# Patient Record
Sex: Female | Born: 1980
Health system: Southern US, Community
[De-identification: ages and names within clinical notes are randomized; demographics above are authoritative.]

## PROBLEM LIST (undated history)

## (undated) DIAGNOSIS — E039 Hypothyroidism, unspecified: Secondary | ICD-10-CM

## (undated) DIAGNOSIS — K219 Gastro-esophageal reflux disease without esophagitis: Secondary | ICD-10-CM

## (undated) DIAGNOSIS — M549 Dorsalgia, unspecified: Secondary | ICD-10-CM

## (undated) DIAGNOSIS — C73 Malignant neoplasm of thyroid gland: Secondary | ICD-10-CM

## (undated) DIAGNOSIS — M2550 Pain in unspecified joint: Secondary | ICD-10-CM

## (undated) DIAGNOSIS — I10 Essential (primary) hypertension: Secondary | ICD-10-CM

## (undated) DIAGNOSIS — E559 Vitamin D deficiency, unspecified: Secondary | ICD-10-CM

## (undated) DIAGNOSIS — Z86718 Personal history of other venous thrombosis and embolism: Secondary | ICD-10-CM

## (undated) DIAGNOSIS — Z9889 Other specified postprocedural states: Secondary | ICD-10-CM

## (undated) DIAGNOSIS — E7800 Pure hypercholesterolemia, unspecified: Secondary | ICD-10-CM

## (undated) DIAGNOSIS — I2699 Other pulmonary embolism without acute cor pulmonale: Secondary | ICD-10-CM

## (undated) DIAGNOSIS — D689 Coagulation defect, unspecified: Secondary | ICD-10-CM

## (undated) DIAGNOSIS — R112 Nausea with vomiting, unspecified: Secondary | ICD-10-CM

## (undated) HISTORY — DX: Vitamin D deficiency, unspecified: E55.9

## (undated) HISTORY — DX: Pain in unspecified joint: M25.50

## (undated) HISTORY — DX: Essential (primary) hypertension: I10

## (undated) HISTORY — DX: Coagulation defect, unspecified: D68.9

## (undated) HISTORY — DX: Hypothyroidism, unspecified: E03.9

## (undated) HISTORY — DX: Dorsalgia, unspecified: M54.9

## (undated) HISTORY — DX: Gastro-esophageal reflux disease without esophagitis: K21.9

## (undated) HISTORY — DX: Personal history of other venous thrombosis and embolism: Z86.718

## (undated) HISTORY — DX: Pure hypercholesterolemia, unspecified: E78.00

## (undated) HISTORY — PX: OTHER SURGICAL HISTORY: SHX169

## (undated) HISTORY — PX: KNEE SURGERY: SHX244

## (undated) HISTORY — DX: Other pulmonary embolism without acute cor pulmonale: I26.99

---

## 2015-12-29 ENCOUNTER — Ambulatory Visit: Payer: Self-pay | Admitting: Podiatry

## 2017-10-16 DIAGNOSIS — I2699 Other pulmonary embolism without acute cor pulmonale: Secondary | ICD-10-CM

## 2017-10-16 HISTORY — DX: Other pulmonary embolism without acute cor pulmonale: I26.99

## 2017-11-07 ENCOUNTER — Encounter: Payer: Self-pay | Admitting: Internal Medicine

## 2017-11-07 ENCOUNTER — Ambulatory Visit (INDEPENDENT_AMBULATORY_CARE_PROVIDER_SITE_OTHER): Payer: 59 | Admitting: Internal Medicine

## 2017-11-07 VITALS — BP 128/80 | HR 105 | Temp 99.9°F | Ht 68.0 in

## 2017-11-07 DIAGNOSIS — R059 Cough, unspecified: Secondary | ICD-10-CM

## 2017-11-07 DIAGNOSIS — R05 Cough: Secondary | ICD-10-CM

## 2017-11-07 DIAGNOSIS — J069 Acute upper respiratory infection, unspecified: Secondary | ICD-10-CM | POA: Diagnosis not present

## 2017-11-07 DIAGNOSIS — J042 Acute laryngotracheitis: Secondary | ICD-10-CM

## 2017-11-07 MED ORDER — HYDROCODONE-HOMATROPINE 5-1.5 MG/5ML PO SYRP: 5.0000 mL | ORAL_SOLUTION | Freq: Three times a day (TID) | ORAL | 0 refills | Status: DC | PRN

## 2017-11-07 MED ORDER — METHYLPREDNISOLONE ACETATE 80 MG/ML IJ SUSP
80.0000 mg | Freq: Once | INTRAMUSCULAR | Status: AC
  Administered 2017-11-07: 80 mg via INTRAMUSCULAR

## 2017-11-07 MED ORDER — LEVOFLOXACIN 500 MG PO TABS
500.0000 mg | ORAL_TABLET | Freq: Every day | ORAL | 0 refills | Status: DC
Start: 2017-11-07 — End: 2017-11-20

## 2017-11-07 NOTE — Progress Notes (Addendum)
   Subjective:    Patient ID: Penny Hancock, female    DOB: 1981/03/03, 37 y.o.   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  Her general health is excellent.  She has no known drug allergies.  Nearly a week ago she came down with a respiratory infection that she has not been able to get rid.  She has had lots of coughing and some stridor and some sore throat.  She has a history of GE reflux for which she takes Nexium.  NKDA Hx fractured ankle and wrist Hx knee surgery  SHx: Single, nonsmoker  FHx: Parents and twin brothers in good health.  Review of Systems patient says at times she has felt that she had stridor     Objective:   Physical Exam Skin warm and dry.  Pharynx very slightly injected.  TMs are clear.  Neck is supple without adenopathy.  Chest is here to auscultation without rales or wheezing.  No stridor observed.       Assessment & Plan:  Acute respiratory infection with possible laryngeal tracheitis  Plan: Depo-Medrol 80 mg IM.  Hycodan 1 teaspoon p.o. every 8 hours as needed cough.  Continue Nexium.  Levaquin 500 mg daily for 10 days.  Call if not better in 7 to 10 days or sooner if worse.  Rest and drink plenty of fluids.

## 2017-11-10 NOTE — Patient Instructions (Signed)
It was a pleasure to see you today!   

## 2017-11-18 ENCOUNTER — Other Ambulatory Visit: Payer: Self-pay | Admitting: Internal Medicine

## 2017-11-18 ENCOUNTER — Other Ambulatory Visit: Payer: Self-pay

## 2017-11-18 ENCOUNTER — Ambulatory Visit
Admission: RE | Admit: 2017-11-18 | Discharge: 2017-11-18 | Disposition: A | Discharge status: Home / Self Care | Payer: 59 | Source: Ambulatory Visit | Attending: Internal Medicine | Admitting: Internal Medicine

## 2017-11-18 ENCOUNTER — Ambulatory Visit (HOSPITAL_COMMUNITY)
Admission: RE | Admit: 2017-11-18 | Discharge: 2017-11-18 | Disposition: A | Discharge status: Home / Self Care | Payer: 59 | Source: Ambulatory Visit | Attending: Internal Medicine | Admitting: Internal Medicine

## 2017-11-18 ENCOUNTER — Ambulatory Visit (INDEPENDENT_AMBULATORY_CARE_PROVIDER_SITE_OTHER): Payer: 59 | Admitting: Internal Medicine

## 2017-11-18 ENCOUNTER — Encounter: Payer: Self-pay | Admitting: Internal Medicine

## 2017-11-18 ENCOUNTER — Encounter (HOSPITAL_COMMUNITY): Payer: Self-pay | Admitting: *Deleted

## 2017-11-18 ENCOUNTER — Inpatient Hospital Stay (HOSPITAL_COMMUNITY)
Admission: EM | Admit: 2017-11-18 | Discharge: 2017-11-20 | DRG: 176 | Disposition: A | Discharge status: Home / Self Care | Payer: 59 | Source: Ambulatory Visit | Attending: Family Medicine | Admitting: Family Medicine

## 2017-11-18 ENCOUNTER — Encounter (HOSPITAL_COMMUNITY): Payer: Self-pay | Admitting: Emergency Medicine

## 2017-11-18 VITALS — BP 130/90 | HR 114 | Temp 99.0°F | Ht 68.0 in | Wt 250.0 lb

## 2017-11-18 DIAGNOSIS — Z79899 Other long term (current) drug therapy: Secondary | ICD-10-CM | POA: Diagnosis not present

## 2017-11-18 DIAGNOSIS — K219 Gastro-esophageal reflux disease without esophagitis: Secondary | ICD-10-CM | POA: Diagnosis present

## 2017-11-18 DIAGNOSIS — R05 Cough: Secondary | ICD-10-CM

## 2017-11-18 DIAGNOSIS — Z8639 Personal history of other endocrine, nutritional and metabolic disease: Secondary | ICD-10-CM | POA: Diagnosis not present

## 2017-11-18 DIAGNOSIS — I2699 Other pulmonary embolism without acute cor pulmonale: Principal | ICD-10-CM | POA: Diagnosis present

## 2017-11-18 DIAGNOSIS — R0602 Shortness of breath: Secondary | ICD-10-CM | POA: Diagnosis not present

## 2017-11-18 DIAGNOSIS — Z6841 Body Mass Index (BMI) 40.0 and over, adult: Secondary | ICD-10-CM

## 2017-11-18 DIAGNOSIS — R059 Cough, unspecified: Secondary | ICD-10-CM

## 2017-11-18 DIAGNOSIS — E663 Overweight: Secondary | ICD-10-CM | POA: Diagnosis present

## 2017-11-18 DIAGNOSIS — Z832 Family history of diseases of the blood and blood-forming organs and certain disorders involving the immune mechanism: Secondary | ICD-10-CM

## 2017-11-18 DIAGNOSIS — R000 Tachycardia, unspecified: Secondary | ICD-10-CM | POA: Diagnosis present

## 2017-11-18 DIAGNOSIS — R0902 Hypoxemia: Secondary | ICD-10-CM | POA: Diagnosis present

## 2017-11-18 DIAGNOSIS — R748 Abnormal levels of other serum enzymes: Secondary | ICD-10-CM | POA: Diagnosis present

## 2017-11-18 LAB — CBC WITH DIFFERENTIAL/PLATELET
BASOS ABS: 0 10*3/uL (ref 0.0–0.1)
BASOS PCT: 0 %
EOS PCT: 0 %
Eosinophils Absolute: 0 10*3/uL (ref 0.0–0.7)
HEMATOCRIT: 43.5 % (ref 36.0–46.0)
Hemoglobin: 14.4 g/dL (ref 12.0–15.0)
LYMPHS PCT: 18 %
Lymphs Abs: 1.5 10*3/uL (ref 0.7–4.0)
MCH: 30.7 pg (ref 26.0–34.0)
MCHC: 33.1 g/dL (ref 30.0–36.0)
MCV: 92.8 fL (ref 78.0–100.0)
Monocytes Absolute: 0.6 10*3/uL (ref 0.1–1.0)
Monocytes Relative: 7 %
NEUTROS ABS: 6.2 10*3/uL (ref 1.7–7.7)
Neutrophils Relative %: 75 %
PLATELETS: 235 10*3/uL (ref 150–400)
RBC: 4.69 MIL/uL (ref 3.87–5.11)
RDW: 13.4 % (ref 11.5–15.5)
WBC: 8.4 10*3/uL (ref 4.0–10.5)

## 2017-11-18 LAB — PROTIME-INR
INR: 1.02
Prothrombin Time: 13.3 seconds (ref 11.4–15.2)

## 2017-11-18 LAB — COMPREHENSIVE METABOLIC PANEL
ALBUMIN: 3.8 g/dL (ref 3.5–5.0)
ALT: 20 U/L (ref 14–54)
ANION GAP: 11 (ref 5–15)
AST: 20 U/L (ref 15–41)
Alkaline Phosphatase: 76 U/L (ref 38–126)
BILIRUBIN TOTAL: 0.7 mg/dL (ref 0.3–1.2)
BUN: 13 mg/dL (ref 6–20)
CO2: 22 mmol/L (ref 22–32)
Calcium: 9.1 mg/dL (ref 8.9–10.3)
Chloride: 106 mmol/L (ref 101–111)
Creatinine, Ser: 1.07 mg/dL — ABNORMAL HIGH (ref 0.44–1.00)
GFR calc Af Amer: >60 mL/min (ref >60)
GFR calc non Af Amer: >60 mL/min (ref >60)
Glucose, Bld: 111 mg/dL — ABNORMAL HIGH (ref 65–99)
POTASSIUM: 4.1 mmol/L (ref 3.5–5.1)
Sodium: 139 mmol/L (ref 135–145)
TOTAL PROTEIN: 7.4 g/dL (ref 6.5–8.1)

## 2017-11-18 LAB — APTT: aPTT: 25 seconds (ref 24–36)

## 2017-11-18 LAB — ANTITHROMBIN III: AntiThromb III Func: 96 % (ref 75–120)

## 2017-11-18 LAB — PHOSPHORUS: Phosphorus: 3.4 mg/dL (ref 2.5–4.6)

## 2017-11-18 LAB — MAGNESIUM: Magnesium: 2 mg/dL (ref 1.7–2.4)

## 2017-11-18 LAB — TROPONIN I: TROPONIN I: 0.14 ng/mL — AB (ref <0.03)

## 2017-11-18 MED ORDER — PANTOPRAZOLE SODIUM 40 MG PO TBEC: 40.0000 mg | DELAYED_RELEASE_TABLET | Freq: Every day | ORAL | Status: DC | PRN

## 2017-11-18 MED ORDER — IOPAMIDOL (ISOVUE-370) INJECTION 76%
INTRAVENOUS | Status: AC
  Filled 2017-11-18: qty 100

## 2017-11-18 MED ORDER — ONDANSETRON HCL 4 MG PO TABS
4.0000 mg | ORAL_TABLET | Freq: Four times a day (QID) | ORAL | Status: DC | PRN
Start: 2017-11-18 — End: 2017-11-20

## 2017-11-18 MED ORDER — ACETAMINOPHEN 650 MG RE SUPP: 650.0000 mg | Freq: Four times a day (QID) | RECTAL | Status: DC | PRN

## 2017-11-18 MED ORDER — IOPAMIDOL (ISOVUE-370) INJECTION 76%
100.0000 mL | Freq: Once | INTRAVENOUS | Status: AC | PRN
  Administered 2017-11-18: 100 mL via INTRAVENOUS

## 2017-11-18 MED ORDER — ENOXAPARIN SODIUM 120 MG/0.8ML ~~LOC~~ SOLN
120.0000 mg | Freq: Two times a day (BID) | SUBCUTANEOUS | Status: DC
  Administered 2017-11-18 – 2017-11-19 (×2): 120 mg via SUBCUTANEOUS
  Filled 2017-11-18 (×3): qty 0.8

## 2017-11-18 MED ORDER — ACETAMINOPHEN 325 MG PO TABS
650.0000 mg | ORAL_TABLET | Freq: Four times a day (QID) | ORAL | Status: DC | PRN
  Administered 2017-11-19: 650 mg via ORAL
  Filled 2017-11-18: qty 2

## 2017-11-18 MED ORDER — OXYCODONE HCL 5 MG PO TABS: 5.0000 mg | ORAL_TABLET | ORAL | Status: DC | PRN

## 2017-11-18 MED ORDER — HEPARIN BOLUS VIA INFUSION
5000.0000 [IU] | Freq: Once | INTRAVENOUS | Status: DC
  Filled 2017-11-18: qty 5000

## 2017-11-18 MED ORDER — KETOROLAC TROMETHAMINE 30 MG/ML IJ SOLN: 30.0000 mg | Freq: Four times a day (QID) | INTRAMUSCULAR | Status: DC | PRN

## 2017-11-18 MED ORDER — ONDANSETRON HCL 4 MG/2ML IJ SOLN: 4.0000 mg | Freq: Four times a day (QID) | INTRAMUSCULAR | Status: DC | PRN

## 2017-11-18 MED ORDER — HEPARIN (PORCINE) IN NACL 100-0.45 UNIT/ML-% IJ SOLN
1650.0000 [IU]/h | INTRAMUSCULAR | Status: DC
  Filled 2017-11-18: qty 250

## 2017-11-18 NOTE — H&P (Addendum)
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  Patient states that she had an upper respiratory infection approximately 1 week ago with productive cough, sore throat and fatigue that was treated with a dose of Solu-Medrol and 5 days of levofloxacin.  She states that her symptoms had improved prior to yesterday, when she experienced acute dyspnea with minimal activity.  She states that she has never had similar symptoms in the past.  She states that her dyspnea was improved with rest and worsened with any exertion. 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 She took her pulse oximetry and the value was in the 80s. She presented to her PCP (Dr. Renold Genta), who sent Dr. Marcello Moores to the ED for CTA chest.  She denies chest pain, palpitations, orthopnea.  No fever, chills, nausea, vomiting, infectious symptoms.  She denies recent travel, denies smoking, denies taking OCPs, denies personal history of thromboembolic disease, denies history of miscarriage, denies personal or family history of malignancy.  Patient states that she has a cousin with methylenetetrahydrofolate reductase deficiency and history of clotting disorder.  ED Course: In the ED, she was tachycardic, but normotensive and saturating well on room air. CTA chest showed large right sided pulmonary embolism involving the right pulmonary artery and segmental branches of the  right pulmonary tree. Labs are unremarkable. EKG shows sinus tachycardia, right axis deviation and anterior ST segment depression. Pt receive Lovenox 1 mg/kg in the ED.  Review of Systems: As per HPI otherwise 10 point review of systems negative.    Past Medical History:  Diagnosis Date  . GERD (gastroesophageal reflux disease)     Past Surgical History:  Procedure Laterality Date  . broken ankle    . broken wrist    . KNEE SURGERY       reports that she has never smoked. She has never used smokeless tobacco. She reports that she drinks alcohol. She reports that she does not use drugs.  No Known Allergies  History reviewed. No pertinent family history.  Prior to Admission medications   Medication Sig Start Date End Date Taking? Authorizing Provider  esomeprazole (NEXIUM) 20 MG packet Take 20 mg by mouth daily before breakfast. Three times a week   Yes [provider]  HYDROcodone-homatropine (HYCODAN) 5-1.5 MG/5ML syrup Take 5 mLs by mouth every 8 (eight) hours as needed for cough. Patient not taking: Reported on 11/18/2017 11/07/17   Elby Showers, MD  levofloxacin (LEVAQUIN) 500 MG tablet Take 1 tablet (500 mg total) by mouth daily. Patient not taking: Reported on 11/18/2017 11/07/17   Elby Showers, MD    Physical Exam: Vitals:   11/18/17 1913 11/18/17 1930 11/18/17 2021 11/18/17 2024  BP: 130/83 (!) 118/104 102/80   Pulse: 90 88 96   Resp: (!) 22 18 18    Temp:   98.2 F (36.8 C)   TempSrc:   Oral   SpO2: 96% 98% 94%   Weight:    Marland Kitchen)  154.4 kg (340 lb 4.8 oz)  Height:    5\' 8"  (1.727 m)    Constitutional: NAD, calm, comfortable Eyes: PERRL, lids and conjunctivae normal ENMT: Mucous membranes are moist. Posterior pharynx clear of any exudate or lesions Normal dentition.  Neck: normal, supple, no masses, no thyromegaly Respiratory: clear to auscultation bilaterally, no wheezing, no crackles. Normal respiratory effort. No accessory muscle use.  Cardiovascular:  Tachycardic, no murmurs / rubs / gallops. No extremity edema. 2+ pedal pulses. Abdomen: no tenderness, no masses palpated. Bowel sounds positive.  Musculoskeletal: no clubbing / cyanosis. No joint deformity upper and lower extremities. Good ROM, no contractures. Normal muscle tone.  Skin: no rashes, lesions, ulcers. No induration Neurologic: CN 2-12 grossly intact. Sensation intact, DTR normal. Strength 5/5 in all 4.  Psychiatric: Normal judgment and insight. Alert and oriented x 3. Normal mood.   Labs on Admission: I have personally reviewed following labs and imaging studies  CBC: Recent Labs  Lab 11/18/17 1802  WBC 8.4  NEUTROABS 6.2  HGB 14.4  HCT 43.5  MCV 92.8  PLT 956   Basic Metabolic Panel: Recent Labs  Lab 11/18/17 1802  NA 139  K 4.1  CL 106  CO2 22  GLUCOSE 111*  BUN 13  CREATININE 1.07*  CALCIUM 9.1   GFR: Estimated Creatinine Clearance: 111.5 mL/min (A) (by C-G formula based on SCr of 1.07 mg/dL (H)). Liver Function Tests: Recent Labs  Lab 11/18/17 1802  AST 20  ALT 20  ALKPHOS 76  BILITOT 0.7  PROT 7.4  ALBUMIN 3.8   No results for input(s): LIPASE, AMYLASE in the last 168 hours. No results for input(s): AMMONIA in the last 168 hours. Coagulation Profile: No results for input(s): INR, PROTIME in the last 168 hours. Cardiac Enzymes: Recent Labs  Lab 11/18/17 1802  TROPONINI 0.14*   BNP (last 3 results) No results for input(s): PROBNP in the last 8760 hours. HbA1C: No results for input(s): HGBA1C in the last 72 hours. CBG: No results for input(s): GLUCAP in the last 168 hours. Lipid Profile: No results for input(s): CHOL, HDL, LDLCALC, TRIG, CHOLHDL, LDLDIRECT in the last 72 hours. Thyroid Function Tests: No results for input(s): TSH, T4TOTAL, FREET4, T3FREE, THYROIDAB in the last 72 hours. Anemia Panel: No results for input(s): VITAMINB12, FOLATE, FERRITIN, TIBC, IRON, RETICCTPCT in the last 72 hours. Urine analysis: No results found  for: COLORURINE, APPEARANCEUR, LABSPEC, [place], GLUCOSEU, HGBUR, BILIRUBINUR, KETONESUR, PROTEINUR, UROBILINOGEN, NITRITE, LEUKOCYTESUR  Radiological Exams on Admission: Dg Chest 2 View  Result Date: 11/18/2017 CLINICAL DATA:  Cough for 1 week EXAM: CHEST - 2 VIEW COMPARISON:  None. FINDINGS: Normal heart size and mediastinal contours. No acute infiltrate or edema. No effusion or pneumothorax. No acute osseous findings. IMPRESSION: Negative chest. Electronically Signed   By: Monte Fantasia M.D.   On: 11/18/2017 12:16   Ct Angio Chest Pe W Or Wo Contrast  Result Date: 11/18/2017 CLINICAL DATA:  37 year old female with shortness of breath and a persistent possible upper respiratory infection for the past week. Additional symptoms include coughing and sore throat. EXAM: CT ANGIOGRAPHY CHEST WITH CONTRAST TECHNIQUE: Multidetector CT imaging of the chest was performed using the standard protocol during bolus administration of intravenous contrast. Multiplanar CT image reconstructions and MIPs were obtained to evaluate the vascular anatomy. CONTRAST:  118mL ISOVUE-370 IOPAMIDOL (ISOVUE-370) INJECTION 76% COMPARISON:  Chest x-ray obtained earlier today 11/18/2017 FINDINGS: Cardiovascular: Adequate opacification of the pulmonary arteries to the segmental level. A filling defect is present  in the right main pulmonary artery extending up into the right upper lobe, and down into the right lower lobe pulmonary arteries and subsequent segmental branches consistent with acute pulmonary embolus. A small amount of embolus also extends into the right middle lobe pulmonary artery but this is not occlusive. On the left, thrombus burden is significantly smaller with small left upper and lower lobe segmental and subsegmental pulmonary emboli. The heart remains normal in size. Normal RV LV ratio common no evidence of right heart strain. No pericardial effusion. The aorta is normal in caliber. Conventional 3 vessel arch anatomy.  Mediastinum/Nodes: Approximately 1.5 cm low-attenuation nodule within the inferior aspect of the right thyroid gland with peripheral dystrophic calcifications. No evidence of mediastinal or hilar adenopathy. Small hiatal hernia. The thoracic esophagus is otherwise normal in appearance. Lungs/Pleura: Scattered foci of ground-glass attenuation airspace opacity in a peribronchovascular distribution in the right upper lobe most consistent with mild bronchopneumonia or an atypical infectious/inflammatory process. No evidence of pulmonary infarct, pleural effusion or pneumothorax. Upper Abdomen: Visualized upper abdominal organs are unremarkable. Musculoskeletal: No acute fracture or aggressive appearing lytic or blastic osseous lesion. Review of the MIP images confirms the above findings. IMPRESSION: 1. Positive for moderately large acute pulmonary embolus involving the distal right main pulmonary artery and extending into the right upper and lower lobe pulmonary arteries and their segmental branches. Additional minimal nonocclusive thrombus present within the right middle lobe pulmonary artery, and there are a few small scattered segmental and subsegmental PEs in the left upper and lower lobes. No evidence of right heart strain. 2. Evidence of an infectious/inflammatory process in the right upper lobe, likely mild bronchopneumonia. 3. Right inferior thyroid nodule or cyst. Recommend further evaluation with dedicated thyroid ultrasound if not previously evaluated. 4. Small hiatal hernia. These results were called by telephone at the time of interpretation on 11/18/2017 at 4:52 pm to Dr. Tedra Senegal , who verbally acknowledged these results. Electronically Signed   By: Jacqulynn Cadet M.D.   On: 11/18/2017 16:53    EKG: Independently reviewed. Sinus tachycardia, right axis deviation, ST depression in anterior leads.  Assessment/Plan Active Problems:   Acute pulmonary embolism without acute cor pulmonale  (HCC)  Acute, right-sided, unprovoked pulmonary embolus Large filling defect in right pulmonary artery and segmental branches without hemodynamic compromise. No oxygen requirement at rest. Anticipate at least 3 months anticoagulation for acute PE, although there is compelling argument for lifelong anticoagulation in the setting of unprovoked PE. - Lovenox 1 mg/kg - Likely transition to [place] in AM - Duplex U/S of BLE - Obtain TTE - Trend trop to peak - Thrombophilia panel ordered in ED - Monitor on telemetry given large size of PE, abnormal EKG - Ambulatory oximetry in AM - Pain control PRN  GERD - Continue PRN PPI per home  DVT prophylaxis: Therapeutic Lovenox Code Status: Full Disposition Plan: Home in 1-2 days Consults called: None Admission status: Inpatient, Telemetry   Adam Sharene Butters MD [facility]  If 7PM-7AM, please contact night-coverage [email or web address] Password [facility]  11/18/2017, 8:34 PM

## 2017-11-18 NOTE — Progress Notes (Signed)
   Subjective:    Patient ID: Penny Hancock, female    DOB: Oct 08, 1980, 37 y.o.   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 Was short of breath through out the evening last night. Felt very SOB getting out of shower this am. No hemoptysis. Was treated for protracted Respiratory infection May 23 and seemed to improve. See note in Epic. No  chest pain. She noted her oxygen sats dropped to the 80's at her office today when walking around.  She had out patient CXR today that was negative. Seen in office and noted to be dyspneic sitting in chair.      Review of Systems see above     Objective:   Physical Exam She is tachycardic with rate 114.  Sitting her pulse ox is 96%.  Blood pressure is 130/90.  She is acutely dyspneic sitting in chair in the office.  Her chest is clear to auscultation.  Cardiac exam tachycardia regular rate and rhythm.  No lower extremity edema.  Homans sign negative bilaterally.       Assessment & Plan:  Concern for PE with acute dyspnea  Plan: Patient will be sent to [facility] Radiology department for CT with contrast.  Addendum: Phone call from radiologist, she has moderately large acute pulmonary embolus involving the distal right main pulmonary artery and extending into the right upper and lower lobe pulmonary arteries and their segmental branches.  Additional minimal nonocclusive thrombus present within the right middle lobe pulmonary artery and there are a few small scattered segmental and subsegmental PEs in the left upper and lower lobes.  No evidence of right heart strain.  Evidence of infectious or inflammatory process in the right upper lobe likely mild bronchopneumonia.  She also has an inferior thyroid nodule or cyst-recommend thyroid ultrasound.  She has a small hiatal hernia.  Plan: Spoke with Dr. Regenia Skeeter at [facility] emergency department regarding findings and need for admission.  Spoke  with Pulmonologist on-call for [facility] and he recommended patient go through the Emergency Department for admission

## 2017-11-18 NOTE — ED Notes (Signed)
Bed: WA17 Expected date:  Expected time:  Means of arrival:  Comments: Hold for DR Grandville Silos

## 2017-11-18 NOTE — ED Provider Notes (Signed)
[facility] DEPT Provider Note   CSN: [id number] Arrival date & time: 11/18/17  1704     History   Chief Complaint Chief Complaint  Patient presents with  . pulmonary embolis    HPI Penny [place] is a 37 y.o. female.  HPI  36 year old female presents from radiology after a CT showed pulmonary embolism.  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  She states the shortness of breath significantly started yesterday at a cookout.  It has persisted today.  She recently got over an illness where she was on antibiotics and steroids with cough and fever.  That seems to be better but now she is short of breath.  She denies any history of recent travel or leg swelling.  She herself does not have a coagulation history but states that multiple family members have had hypercoagulable state.  She noted her O2 sat seem to be in the low 80s when she checked it at clinic when she was particularly short of breath.  Past Medical History:  Diagnosis Date  . GERD (gastroesophageal reflux disease)     Patient Active Problem List   Diagnosis Date Noted  . Acute pulmonary embolism without acute cor pulmonale ([place]) 11/18/2017    Past Surgical History:  Procedure Laterality Date  . broken ankle    . broken wrist    . KNEE SURGERY       OB History   None      Home Medications    Prior to Admission medications   Medication Sig Start Date End Date Taking? Authorizing Provider  esomeprazole (NEXIUM) 20 MG packet Take 20 mg by mouth daily before breakfast. Three times a week   Yes [provider]  HYDROcodone-homatropine (HYCODAN) 5-1.5 MG/5ML syrup Take 5 mLs by mouth every 8 (eight) hours as needed for cough. Patient not taking: Reported on 11/18/2017 11/07/17   Elby Showers, MD  levofloxacin (LEVAQUIN) 500 MG tablet Take 1 tablet (500 mg total) by mouth  daily. Patient not taking: Reported on 11/18/2017 11/07/17   Elby Showers, MD    Family History History reviewed. No pertinent family history.  Social History Social History   Tobacco Use  . Smoking status: Never Smoker  . Smokeless tobacco: Never Used  Substance Use Topics  . Alcohol use: Yes    Comment: occasional  . Drug use: Never     Allergies   Patient has no known allergies.   Review of Systems Review of Systems  Constitutional: Negative for fever.  Respiratory: Positive for shortness of breath. Negative for cough.   Cardiovascular: Negative for chest pain and leg swelling.  All other systems reviewed and are negative.    Physical Exam Updated Vital Signs BP 102/80 (BP Location: Right Arm)   Pulse 96   Temp 98.2 F (36.8 C) (Oral)   Resp 18   Ht 5\' 8"  (1.727 m)   Wt (!) 154.4 kg (340 lb 4.8 oz)   LMP 11/04/2017 (Approximate)   SpO2 94%   BMI 51.74 kg/m   Physical Exam  Constitutional: She is oriented to person, place, and time. She appears well-developed and well-nourished. No distress.  HENT:  Head: Normocephalic and atraumatic.  Right Ear: External ear normal.  Left Ear: External ear normal.  Nose: Nose normal.  Eyes: Right eye exhibits no discharge. Left eye exhibits no discharge.  Cardiovascular: Regular rhythm and normal heart sounds. Tachycardia present.  Pulmonary/Chest: Effort normal and breath sounds normal. No accessory muscle usage. Tachypnea noted.  Neurological: She is alert and oriented to person, place, and time.  Skin: Skin is warm and dry. She is not diaphoretic.  Nursing note and vitals reviewed.    ED Treatments / Results  Labs (all labs ordered are listed, but only abnormal results are displayed) Labs Reviewed  TROPONIN I - Abnormal; Notable for the following components:      Result Value   Troponin I 0.14 (*)    All other components within normal limits  COMPREHENSIVE METABOLIC PANEL - Abnormal; Notable for the  following components:   Glucose, Bld 111 (*)    Creatinine, Ser 1.07 (*)    All other components within normal limits  CBC WITH DIFFERENTIAL/PLATELET  ANTITHROMBIN III  APTT  MAGNESIUM  PHOSPHORUS  PROTIME-INR  PROTEIN C ACTIVITY  PROTEIN C, TOTAL  PROTEIN S ACTIVITY  PROTEIN S, TOTAL  LUPUS ANTICOAGULANT PANEL  BETA-2-GLYCOPROTEIN I ABS, IGG/M/A  HOMOCYSTEINE  FACTOR 5 LEIDEN  PROTHROMBIN GENE MUTATION  CARDIOLIPIN ANTIBODIES, IGG, IGM, IGA  BASIC METABOLIC PANEL  CBC  TSH  TROPONIN I  TROPONIN I    EKG None  Radiology Dg Chest 2 View  Result Date: 11/18/2017 CLINICAL DATA:  Cough for 1 week EXAM: CHEST - 2 VIEW COMPARISON:  None. FINDINGS: Normal heart size and mediastinal contours. No acute infiltrate or edema. No effusion or pneumothorax. No acute osseous findings. IMPRESSION: Negative chest. Electronically Signed   By: Monte Fantasia M.D.   On: 11/18/2017 12:16   Ct Angio Chest Pe W Or Wo Contrast  Result Date: 11/18/2017 CLINICAL DATA:  37 year old female with shortness of breath and a persistent possible upper respiratory infection for the past week. Additional symptoms include coughing and sore throat. EXAM: CT ANGIOGRAPHY CHEST WITH CONTRAST TECHNIQUE: Multidetector CT imaging of the chest was performed using the standard protocol during bolus administration of intravenous contrast. Multiplanar CT image reconstructions and MIPs were obtained to evaluate the vascular anatomy. CONTRAST:  131mL ISOVUE-370 IOPAMIDOL (ISOVUE-370) INJECTION 76% COMPARISON:  Chest x-ray obtained earlier today 11/18/2017 FINDINGS: Cardiovascular: Adequate opacification of the pulmonary arteries to the segmental level. A filling defect is present in the right main pulmonary artery extending up into the right upper lobe, and down into the right lower lobe pulmonary arteries and subsequent segmental branches consistent with acute pulmonary embolus. A small amount of embolus also extends into the  right middle lobe pulmonary artery but this is not occlusive. On the left, thrombus burden is significantly smaller with small left upper and lower lobe segmental and subsegmental pulmonary emboli. The heart remains normal in size. Normal RV LV ratio common no evidence of right heart strain. No pericardial effusion. The aorta is normal in caliber. Conventional 3 vessel arch anatomy. Mediastinum/Nodes: Approximately 1.5 cm low-attenuation nodule within the inferior aspect of the right thyroid gland with peripheral dystrophic calcifications. No evidence of mediastinal or hilar adenopathy. Small hiatal hernia. The thoracic esophagus is otherwise normal in appearance. Lungs/Pleura: Scattered foci of ground-glass attenuation airspace opacity in a peribronchovascular distribution in the right upper lobe most consistent with mild bronchopneumonia or an atypical infectious/inflammatory process. No evidence of pulmonary infarct, pleural effusion or pneumothorax. Upper Abdomen: Visualized upper abdominal organs are unremarkable. Musculoskeletal: No acute fracture or aggressive appearing lytic or blastic osseous lesion. Review of the MIP images confirms the above findings. IMPRESSION: 1. Positive for moderately large acute  pulmonary embolus involving the distal right main pulmonary artery and extending into the right upper and lower lobe pulmonary arteries and their segmental branches. Additional minimal nonocclusive thrombus present within the right middle lobe pulmonary artery, and there are a few small scattered segmental and subsegmental PEs in the left upper and lower lobes. No evidence of right heart strain. 2. Evidence of an infectious/inflammatory process in the right upper lobe, likely mild bronchopneumonia. 3. Right inferior thyroid nodule or cyst. Recommend further evaluation with dedicated thyroid ultrasound if not previously evaluated. 4. Small hiatal hernia. These results were called by telephone at the time of  interpretation on 11/18/2017 at 4:52 pm to Dr. Tedra Senegal , who verbally acknowledged these results. Electronically Signed   By: Jacqulynn Cadet M.D.   On: 11/18/2017 16:53    Procedures .Critical Care Performed by: Sherwood Gambler, MD Authorized by: Sherwood Gambler, MD   Critical care provider statement:    Critical care time (minutes):  30   Critical care time was exclusive of:  Separately billable procedures and treating other patients   Critical care was necessary to treat or prevent imminent or life-threatening deterioration of the following conditions:  Respiratory failure, cardiac failure and circulatory failure   Critical care was time spent personally by me on the following activities:  Development of treatment plan with patient or surrogate, discussions with consultants, evaluation of patient's response to treatment, examination of patient, obtaining history from patient or surrogate, ordering and performing treatments and interventions, ordering and review of laboratory studies, ordering and review of radiographic studies, pulse oximetry, re-evaluation of patient's condition and review of old charts   (including critical care time)  Medications Ordered in ED Medications  enoxaparin (LOVENOX) injection 120 mg (120 mg Subcutaneous Given 11/18/17 1958)  acetaminophen (TYLENOL) tablet 650 mg (has no administration in time range)    Or  acetaminophen (TYLENOL) suppository 650 mg (has no administration in time range)  oxyCODONE (Oxy IR/ROXICODONE) immediate release tablet 5 mg (has no administration in time range)  ketorolac (TORADOL) 30 MG/ML injection 30 mg (has no administration in time range)  ondansetron (ZOFRAN) tablet 4 mg (has no administration in time range)    Or  ondansetron (ZOFRAN) injection 4 mg (has no administration in time range)  pantoprazole (PROTONIX) EC tablet 40 mg (has no administration in time range)     Initial Impression / Assessment and Plan / ED Course    I have reviewed the triage vital signs and the nursing notes.  Pertinent labs & imaging results that were available during my care of the patient were reviewed by me and considered in my medical decision making (see chart for details).     The patient has a new acute pulmonary embolism.  She is getting over a cold/pneumonia.  She does not appear to have respiratory compromise but has been tachycardic and notes decreased O2 saturations when walking at work.  She will need to be placed on heparin and lab work has been evaluated and labs for hypercoagulable state have been sent.  Hospitalist to admit.  Final Clinical Impressions(s) / ED Diagnoses   Final diagnoses:  Acute pulmonary embolism without acute cor pulmonale, unspecified pulmonary embolism type [facility])    ED Discharge Orders    None       Sherwood Gambler, MD 11/19/17 0007

## 2017-11-18 NOTE — Progress Notes (Addendum)
ANTICOAGULATION CONSULT NOTE - Initial Consult  Pharmacy Consult for Enoxaparin Indication: pulmonary embolus  No Known Allergies  Patient Measurements: Height: 5\' 8"  (172.7 cm) Weight: 275 lb (124.7 kg) IBW/kg (Calculated) : 63.9 Heparin Dosing Weight: 93kg  Vital Signs: Temp: 98.1 F (36.7 C) (06/03 1712) Temp Source: Oral (06/03 1712) BP: 157/73 (06/03 1712) Pulse Rate: 111 (06/03 1712)  Labs: No results for input(s): HGB, HCT, PLT, APTT, LABPROT, INR, HEPARINUNFRC, HEPRLOWMOCWT, CREATININE, CKTOTAL, CKMB, TROPONINI in the last 72 hours.  CrCl cannot be calculated (No order found.).   Medical History: Past Medical History:  Diagnosis Date  . GERD (gastroesophageal reflux disease)     Assessment: 67 YOF presents with outpatient CTA revealing acute PE.  Pharmacy asked to dose heparin.  Per EDP note, multiple family members with hypercoagulable state.  Today, 11/18/2017  Baseline CBC WNL  No anticoagulants prior to admission  CrCl > 68ml/min  Hypercoag panel labs drawn  Baseline coags drawn  Goal of Therapy:  Heparin level 0.3-0.7 units/ml Monitor platelets by anticoagulation protocol: Yes   Plan:   Heparin 5000 unit bolus then 1650 units/hr  Check 6h heparin level  Daily CBC, heparin level  Monitor for bleeding  Await plans for PO anticoagulation  Doreene Eland, PharmD, BCPS.   Pager: [phone] 11/18/2017 5:34 PM  Addendum: - orders to change heparin to enoxaparin - heparin not started  Plan - enoxaparin 120mg  (~1mg /kg) q12h for PE - CBC at least q72h while in hospital  Doreene Eland, PharmD, BCPS.   Pager: [phone] 11/18/2017 7:06 PM

## 2017-11-18 NOTE — Patient Instructions (Signed)
Present to [facility] ED for admission.

## 2017-11-18 NOTE — ED Notes (Signed)
ED TO INPATIENT HANDOFF REPORT  Name/Age/Gender Penny Hancock 37 y.o. female  Code Status   Home/SNF/Other Home  Chief Complaint abnormal labs   Level of Care/Admitting Diagnosis ED Disposition    ED Disposition Condition [place]: [facility] [[id number]]  Level of Care: Telemetry [5]  Admit to tele based on following criteria: Other see comments  Comments: Large pulmonary embolism  Diagnosis: Acute pulmonary embolism without acute cor pulmonale [facility]) [[id number]]  Admitting Physician: Bennie Pierini [[id number]]  Attending Physician: Jonnie Finner, [place] [[id number]]  Estimated length of stay: past midnight tomorrow  Certification:: I certify this patient will need inpatient services for at least 2 midnights  PT Class (Do Not Modify): Inpatient [101]  PT Acc Code (Do Not Modify): Private [1]       Medical History Past Medical History:  Diagnosis Date  . GERD (gastroesophageal reflux disease)     Allergies No Known Allergies  IV Location/Drains/Wounds Patient Lines/Drains/Airways Status   Active Line/Drains/Airways    Name:   Placement date:   Placement time:   Site:   Days:   Peripheral IV 11/18/17 Anterior;Right Forearm   11/18/17    1621    Forearm   less than 1          Labs/Imaging Results for orders placed or performed during the hospital encounter of 11/18/17 (from the past 48 hour(s))  Troponin I     Status: Abnormal   Collection Time: 11/18/17  6:02 PM  Result Value Ref Range   Troponin I 0.14 (HH) <0.03 ng/mL    Comment: CRITICAL RESULT CALLED TO, READ BACK BY AND VERIFIED WITH: L.ADKINS AT 1916 ON 11/18/17 BY N.THOMPSON Performed at [facility], [place] [address], [place], [place] [address]   CBC with Differential     Status: None   Collection Time: 11/18/17  6:02 PM  Result Value Ref Range   WBC 8.4 4.0 - 10.5 K/uL   RBC 4.69 3.87 - 5.11 MIL/uL   Hemoglobin 14.4 12.0 - 15.0 g/dL    HCT 43.5 36.0 - 46.0 %   MCV 92.8 78.0 - 100.0 fL   MCH 30.7 26.0 - 34.0 pg   MCHC 33.1 30.0 - 36.0 g/dL   RDW 13.4 11.5 - 15.5 %   Platelets 235 150 - 400 K/uL   Neutrophils Relative % 75 %   Neutro Abs 6.2 1.7 - 7.7 K/uL   Lymphocytes Relative 18 %   Lymphs Abs 1.5 0.7 - 4.0 K/uL   Monocytes Relative 7 %   Monocytes Absolute 0.6 0.1 - 1.0 K/uL   Eosinophils Relative 0 %   Eosinophils Absolute 0.0 0.0 - 0.7 K/uL   Basophils Relative 0 %   Basophils Absolute 0.0 0.0 - 0.1 K/uL    Comment: Performed at [facility], [place] [address] [place], [place] [address]  Comprehensive metabolic panel     Status: Abnormal   Collection Time: 11/18/17  6:02 PM  Result Value Ref Range   Sodium 139 135 - 145 mmol/L   Potassium 4.1 3.5 - 5.1 mmol/L   Chloride 106 101 - 111 mmol/L   CO2 22 22 - 32 mmol/L   Glucose, Bld 111 (H) 65 - 99 mg/dL   BUN 13 6 - 20 mg/dL   Creatinine, Ser 1.07 (H) 0.44 - 1.00 mg/dL   Calcium 9.1 8.9 - 10.3 mg/dL   Total Protein 7.4 6.5 - 8.1 g/dL  Albumin 3.8 3.5 - 5.0 g/dL   AST 20 15 - 41 U/L   ALT 20 14 - 54 U/L   Alkaline Phosphatase 76 38 - 126 U/L   Total Bilirubin 0.7 0.3 - 1.2 mg/dL   GFR calc non Af Amer >60 >60 mL/min   GFR calc Af Amer >60 >60 mL/min    Comment: (NOTE) The eGFR has been calculated using the CKD EPI equation. This calculation has not been validated in all clinical situations. eGFR's persistently <60 mL/min signify possible Chronic Kidney Disease.    Anion gap 11 5 - 15    Comment: Performed at [facility], [place] [address], [place], [place] [address]  APTT     Status: None   Collection Time: 11/18/17  6:02 PM  Result Value Ref Range   aPTT 25 24 - 36 seconds    Comment: Performed at [facility], [place] [address], [place], [place] [address]   Dg Chest 2 View  Result Date: 11/18/2017 CLINICAL DATA:  Cough for 1 week EXAM: CHEST - 2 VIEW COMPARISON:  None. FINDINGS: Normal heart  size and mediastinal contours. No acute infiltrate or edema. No effusion or pneumothorax. No acute osseous findings. IMPRESSION: Negative chest. Electronically Signed   By: Monte Fantasia M.D.   On: 11/18/2017 12:16   Ct Angio Chest Pe W Or Wo Contrast  Result Date: 11/18/2017 CLINICAL DATA:  37 year old female with shortness of breath and a persistent possible upper respiratory infection for the past week. Additional symptoms include coughing and sore throat. EXAM: CT ANGIOGRAPHY CHEST WITH CONTRAST TECHNIQUE: Multidetector CT imaging of the chest was performed using the standard protocol during bolus administration of intravenous contrast. Multiplanar CT image reconstructions and MIPs were obtained to evaluate the vascular anatomy. CONTRAST:  126m ISOVUE-370 IOPAMIDOL (ISOVUE-370) INJECTION 76% COMPARISON:  Chest x-ray obtained earlier today 11/18/2017 FINDINGS: Cardiovascular: Adequate opacification of the pulmonary arteries to the segmental level. A filling defect is present in the right main pulmonary artery extending up into the right upper lobe, and down into the right lower lobe pulmonary arteries and subsequent segmental branches consistent with acute pulmonary embolus. A small amount of embolus also extends into the right middle lobe pulmonary artery but this is not occlusive. On the left, thrombus burden is significantly smaller with small left upper and lower lobe segmental and subsegmental pulmonary emboli. The heart remains normal in size. Normal RV LV ratio common no evidence of right heart strain. No pericardial effusion. The aorta is normal in caliber. Conventional 3 vessel arch anatomy. Mediastinum/Nodes: Approximately 1.5 cm low-attenuation nodule within the inferior aspect of the right thyroid gland with peripheral dystrophic calcifications. No evidence of mediastinal or hilar adenopathy. Small hiatal hernia. The thoracic esophagus is otherwise normal in appearance. Lungs/Pleura: Scattered  foci of ground-glass attenuation airspace opacity in a peribronchovascular distribution in the right upper lobe most consistent with mild bronchopneumonia or an atypical infectious/inflammatory process. No evidence of pulmonary infarct, pleural effusion or pneumothorax. Upper Abdomen: Visualized upper abdominal organs are unremarkable. Musculoskeletal: No acute fracture or aggressive appearing lytic or blastic osseous lesion. Review of the MIP images confirms the above findings. IMPRESSION: 1. Positive for moderately large acute pulmonary embolus involving the distal right main pulmonary artery and extending into the right upper and lower lobe pulmonary arteries and their segmental branches. Additional minimal nonocclusive thrombus present within the right middle lobe pulmonary artery, and there are a few small scattered segmental and subsegmental PEs in the  left upper and lower lobes. No evidence of right heart strain. 2. Evidence of an infectious/inflammatory process in the right upper lobe, likely mild bronchopneumonia. 3. Right inferior thyroid nodule or cyst. Recommend further evaluation with dedicated thyroid ultrasound if not previously evaluated. 4. Small hiatal hernia. These results were called by telephone at the time of interpretation on 11/18/2017 at 4:52 pm to Dr. Tedra Senegal , who verbally acknowledged these results. Electronically Signed   By: Jacqulynn Cadet M.D.   On: 11/18/2017 16:53    Pending Labs Unresulted Labs (From admission, onward)   Start     Ordered   11/19/17 0500  CBC  Every 72 hours,   R     11/18/17 1907   11/18/17 1723  Antithrombin III  (Hypercoagulable Panel, Comprehensive (PN[facility]))  Once,   R     11/18/17 1723   11/18/17 1723  Protein C activity  (Hypercoagulable Panel, Comprehensive (PNL))  Once,   R     11/18/17 1723   11/18/17 1723  Protein C, total  (Hypercoagulable Panel, Comprehensive (PNL))  Once,   R     11/18/17 1723   11/18/17 1723  Protein S activity   (Hypercoagulable Panel, Comprehensive (PNL))  Once,   R     11/18/17 1723   11/18/17 1723  Protein S, total  (Hypercoagulable Panel, Comprehensive (PNL))  Once,   R     11/18/17 1723   11/18/17 1723  Lupus anticoagulant panel  (Hypercoagulable Panel, Comprehensive (PN[facility]))  Once,   R     11/18/17 1723   11/18/17 1723  Beta-2-glycoprotein i abs, IgG/M/A  (Hypercoagulable Panel, Comprehensive (PN[facility]))  Once,   R     11/18/17 1723   11/18/17 1723  Homocysteine, serum  (Hypercoagulable Panel, Comprehensive (PN[facility]))  Once,   R     11/18/17 1723   11/18/17 1723  Factor 5 leiden  (Hypercoagulable Panel, Comprehensive (PNL))  Once,   R     11/18/17 1723   11/18/17 1723  Prothrombin gene mutation  (Hypercoagulable Panel, Comprehensive (PNL))  Once,   R     11/18/17 1723   11/18/17 1723  Cardiolipin antibodies, IgG, IgM, IgA  (Hypercoagulable Panel, Comprehensive (PNL))  Once,   R     11/18/17 1723   Signed and Held  Magnesium  Add-on,   R     Signed and Held   Signed and Held  Phosphorus  Add-on,   R     Signed and Held   Signed and Held  Protime-INR  Once,   R     Signed and [employer]  Once,   R     Signed and Held   Signed and Held  TSH  Once,   R     Signed and Held   Signed and Held  Troponin I  Once,   R     Signed and Held   Signed and Held  Basic metabolic panel  Tomorrow morning,   R     Signed and Held   Signed and Held  CBC  Tomorrow morning,   R     Signed and Held      Vitals/Pain Today's Vitals   11/18/17 1830 11/18/17 1842 11/18/17 1913 11/18/17 1930  BP: (!) 155/100 (!) 155/100 130/83 (!) 118/104  Pulse: (!) 101 (!) 102 90 88  Resp: 15 18 (!) 22 18  Temp:      TempSrc:  SpO2: 96% 97% 96% 98%  Weight:      Height:      PainSc:        Isolation Precautions No active isolations  Medications Medications  enoxaparin (LOVENOX) injection 120 mg (120 mg Subcutaneous Given 11/18/17 1958)    Mobility walks

## 2017-11-18 NOTE — ED Notes (Signed)
Date and time results received: 11/18/17 7:18 PM   Test: troponin Critical Value: 0.14  Name of Provider Notified: Regenia Skeeter  Orders Received? Or Actions Taken?: MD made aware

## 2017-11-18 NOTE — ED Triage Notes (Signed)
Pt reports had CT scan done earlier that showed PE due SOB and hypoxia since yesterday.  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

## 2017-11-19 ENCOUNTER — Inpatient Hospital Stay (HOSPITAL_COMMUNITY): Payer: 59

## 2017-11-19 DIAGNOSIS — R0602 Shortness of breath: Secondary | ICD-10-CM

## 2017-11-19 DIAGNOSIS — I2699 Other pulmonary embolism without acute cor pulmonale: Secondary | ICD-10-CM

## 2017-11-19 LAB — PROTEIN S ACTIVITY: PROTEIN S ACTIVITY: 105 % (ref 63–140)

## 2017-11-19 LAB — CBC
HEMATOCRIT: 42 % (ref 36.0–46.0)
HEMOGLOBIN: 13.8 g/dL (ref 12.0–15.0)
MCH: 30.6 pg (ref 26.0–34.0)
MCHC: 32.9 g/dL (ref 30.0–36.0)
MCV: 93.1 fL (ref 78.0–100.0)
Platelets: 209 10*3/uL (ref 150–400)
RBC: 4.51 MIL/uL (ref 3.87–5.11)
RDW: 13.3 % (ref 11.5–15.5)
WBC: 7 10*3/uL (ref 4.0–10.5)

## 2017-11-19 LAB — GLUCOSE, CAPILLARY: GLUCOSE-CAPILLARY: 104 mg/dL — AB (ref 65–99)

## 2017-11-19 LAB — BASIC METABOLIC PANEL
ANION GAP: 11 (ref 5–15)
BUN: 11 mg/dL (ref 6–20)
CO2: 22 mmol/L (ref 22–32)
Calcium: 8.7 mg/dL — ABNORMAL LOW (ref 8.9–10.3)
Chloride: 107 mmol/L (ref 101–111)
Creatinine, Ser: 0.86 mg/dL (ref 0.44–1.00)
GFR calc non Af Amer: >60 mL/min (ref >60)
GLUCOSE: 98 mg/dL (ref 65–99)
POTASSIUM: 4.1 mmol/L (ref 3.5–5.1)
Sodium: 140 mmol/L (ref 135–145)

## 2017-11-19 LAB — LUPUS ANTICOAGULANT PANEL
DRVVT: 41.7 s (ref 0.0–47.0)
PTT LA: 29.8 s (ref 0.0–51.9)

## 2017-11-19 LAB — TROPONIN I
TROPONIN I: 0.09 ng/mL — AB (ref <0.03)
Troponin I: 0.06 ng/mL (ref <0.03)

## 2017-11-19 LAB — PROTEIN C ACTIVITY: Protein C Activity: 122 % (ref 73–180)

## 2017-11-19 LAB — HOMOCYSTEINE: HOMOCYSTEINE-NORM: 16.2 umol/L — AB (ref 0.0–15.0)

## 2017-11-19 LAB — TSH: TSH: 2.026 u[IU]/mL (ref 0.350–4.500)

## 2017-11-19 LAB — PROTEIN S, TOTAL: PROTEIN S AG TOTAL: 88 % (ref 60–150)

## 2017-11-19 MED ORDER — ENOXAPARIN SODIUM 150 MG/ML ~~LOC~~ SOLN
150.0000 mg | Freq: Two times a day (BID) | SUBCUTANEOUS | Status: DC
  Administered 2017-11-19 – 2017-11-20 (×2): 150 mg via SUBCUTANEOUS
  Filled 2017-11-19 (×3): qty 1

## 2017-11-19 NOTE — Progress Notes (Signed)
Preliminary notes--Bilateral lower extremities venous duplex exam completed. Negative for DVT where veins visible. Incidental finding:   A heterogenous palpable non-vascular oval structure seen at popliteal fossa lateral portion, measuring 1.54x0.87x1.04cm, cine-loop available.   Hongying Marceil Welp (RDMS RVT) 11/19/17 11:52 AM

## 2017-11-19 NOTE — Progress Notes (Signed)
ANTICOAGULATION CONSULT NOTE  Pharmacy Consult for Enoxaparin Indication: pulmonary embolus  No Known Allergies  Patient Measurements: Height: 5\' 8"  (172.7 cm) Weight: (!) 340 lb 4.8 oz (154.4 kg) IBW/kg (Calculated) : 63.9 Heparin Dosing Weight: 93kg  Vital Signs: Temp: 97.8 F (36.6 C) (06/04 1340) Temp Source: Oral (06/04 1340) BP: 127/78 (06/04 1340) Pulse Rate: 84 (06/04 1340)  Labs: Recent Labs    11/18/17 1802 11/18/17 2201 11/19/17 0016 11/19/17 0537  HGB 14.4  --   --  13.8  HCT 43.5  --   --  42.0  PLT 235  --   --  209  APTT 25  --   --   --   LABPROT  --  13.3  --   --   INR  --  1.02  --   --   CREATININE 1.07*  --   --  0.86  TROPONINI 0.14*  --  0.09* 0.06*    Estimated Creatinine Clearance: 138.8 mL/min (by C-G formula based on SCr of 0.86 mg/dL).   Medical History: Past Medical History:  Diagnosis Date  . GERD (gastroesophageal reflux disease)     Assessment: 65 YOF presents with outpatient CTA revealing acute PE.  Pharmacy asked to dose heparin.  Per EDP note, multiple family members with hypercoagulable state.  Today, 11/19/2017  CBC WNL  No anticoagulants prior to admission  CrCl > 27ml/min  Hypercoag panel labs IP  Baseline coags drawn  Weight verified with standing scale during rounds- 154.4kg is current weight  Heme input noted  Goal of Therapy:  Heparin level 0.3-0.7 units/ml Monitor platelets by anticoagulation protocol: Yes   Plan:   Increase Lovenox 150mg  sq q12h  CBC q72h & weekly Scr while on Lovenox per inpatient pharmacy protocol  Monitor for bleeding  Await patient decision re: long-term anticoagulation- if chooses warfarin will need to continue Lovenox at least 5 days or until INR therapeutic x24hrs (whichever is greater)  Netta Cedars, PharmD, BCPS Pager: [phone] 11/19/2017 2:39 PM

## 2017-11-19 NOTE — Consult Note (Signed)
Referring MD: Dr Horris Latino  PCP:  Elby Showers, MD   Reason for Referral: Advice on anticoagulation.  Acute pulmonary embolus  Chief Complaint  Patient presents with  . 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  She had a prolonged respiratory illness with productive cough and fever lasting about 10 days.  Her primary care physician gave her a injection of steroids and a course of Levaquin.  Just about the time she finished the antibiotic course, she was out at a picnic and developed acute onset of dyspnea at rest with minimal intermittent chest discomfort.  No significant pleuritic pain.  No hemoptysis.  Symptoms persisted.  Chest radiograph done on June 3 was normal.  Initial vital signs with blood pressure 157/73, pulse 111, respirations 20, temperature 98.1.  No focal findings on exam.  CT angiogram of the chest showed moderate to large volume pulmonary emboli primarily in the right lung involving the distal right main pulmonary artery, right upper and right lower lobes, with small emboli in the left upper and left lower lobes.  No evidence for right heart strain with normal RV LV ratio.  Incidentally noted was an area of groundglass appearance peribronchial vascular distribution in the right upper lobe consistent with a mild inflammatory process.  A 1.5 cm low-attenuation nodule within the inferior aspect of the right thyroid gland.  No pulmonary parenchymal mass.  No mediastinal or hilar adenopathy.  Menses are regular.  She takes no estrogen-containing contraceptives or other estrogen preparations.  She has no signs or symptoms of a collagen vascular disorder and denies polyarthralgia, polymyalgia, hair loss, unusual skin rashes. She is a non-smoker. She is overweight.  Weights in the hospital have been erratic.  Current weight listed is 340 pounds.   5 feet 8 inches tall.  2 other weights recorded  are 250 pounds and 275 pounds.  Parents are alive and healthy.  She has twin brothers younger than her or healthy.  No family history of clots.   Past Medical History:  Diagnosis Date  . GERD (gastroesophageal reflux disease)   : No history of hypertension, diabetes, ulcers, hepatitis, yellow jaundice, mononucleosis, malaria, kidney disease, seizure, stroke, or inflammatory arthritis.  She had a biopsy of a benign thyroid nodule in the past.  Remote knee surgery for torn meniscus as a teenager.  Past Surgical History:  Procedure Laterality Date  . broken ankle    . broken wrist    . KNEE SURGERY    :  . enoxaparin (LOVENOX) injection  120 mg Subcutaneous Q12H  :  No Known Allergies:  .:  Family history: See HPI  Social History   Socioeconomic History  . Marital status: Single    Spouse name: Not on file  . Number of children: Not on file  . Years of education: Not on file  . Highest education level:  Medical school  Occupational History  .  General surgeon  Social Needs  . Financial resource strain: Not on file  . Food insecurity:    Worry: Not on file    Inability: Not on file  . Transportation needs:    Medical: Not on file    Non-medical: Not on file  Tobacco Use  . Smoking status: Never Smoker  . Smokeless tobacco: Never Used  Substance and Sexual Activity  . Alcohol use: Yes: Rare, social    Comment: occasional  . Drug use: Never  .  Sexual activity: Not on file  Lifestyle  . Physical activity:    Days per week: Not on file    Minutes per session: Not on file  . Stress: Not on file  Relationships  . Social connections:    Talks on phone: Not on file    Gets together: Not on file    Attends religious service: Not on file    Active member of club or organization: Not on file    Attends meetings of clubs or organizations: Not on file    Relationship status: Not on file  . Intimate partner violence:    Fear of current or ex partner: Not on file     Emotionally abused: Not on file    Physically abused: Not on file    Forced sexual activity: Not on file  Other Topics Concern  . Not on file  Social History Narrative  . Not on file  :  ROS: See HPI  Vitals: Vitals:   11/19/17 0536 11/19/17 1340  BP: 111/76 127/78  Pulse: 71 84  Resp: 18 14  Temp: 98.1 F (36.7 C) 97.8 F (36.6 C)  SpO2: 94% 97%    PHYSICAL EXAM: General appearance: Overweight Caucasian female breathing comfortably on room air.  No signs of hemodynamic instability since admission yesterday. HEENT: Pharynx no erythema exudate or mass.  Teeth in good repair.  Neck with full range of motion. Lymph Nodes: No cervical, supraclavicular, or axillary adenopathy Resp: Lungs overall clear to auscultation and resonant to percussion throughout Cardio: Regular cardiac rhythm, no murmur, no gallop, no rub.  No JVD.  No peripheral edema Vascular: Carotid pulses 2+.  No bruits.  Dorsalis pedis pulses 1+ symmetric Breasts: Not examined GI: Abdomen soft, nontender, no obvious mass or organomegaly GU: Not examined Extremities: No gross cyanosis.  Toenails are painted with nail polish.  Neurologic: Alert and oriented x3, pupils equal round reactive to light, fundi not examined, full extraocular movements, motor strength 5/5 upper and lower extremities, reflexes 1+ symmetric at the biceps, absent symmetric at the knees.  Upper body coordination normal.  Gait not tested. Skin: No rash or ecchymosis  Labs:  Recent Labs    11/18/17 1802 11/19/17 0537  WBC 8.4 7.0  HGB 14.4 13.8  HCT 43.5 42.0  PLT 235 209   Recent Labs    11/18/17 1802 11/19/17 0537  NA 139 140  K 4.1 4.1  CL 106 107  CO2 22 22  GLUCOSE 111* 98  BUN 13 11  CREATININE 1.07* 0.86  CALCIUM 9.1 8.7*      Images Studies/Results:  Dg Chest 2 View  Result Date: 11/18/2017 CLINICAL DATA:  Cough for 1 week EXAM: CHEST - 2 VIEW COMPARISON:  None. FINDINGS: Normal heart size and mediastinal  contours. No acute infiltrate or edema. No effusion or pneumothorax. No acute osseous findings. IMPRESSION: Negative chest. Electronically Signed   By: Monte Fantasia M.D.   On: 11/18/2017 12:16   Ct Angio Chest Pe W Or Wo Contrast  Result Date: 11/18/2017 CLINICAL DATA:  37 year old female with shortness of breath and a persistent possible upper respiratory infection for the past week. Additional symptoms include coughing and sore throat. EXAM: CT ANGIOGRAPHY CHEST WITH CONTRAST TECHNIQUE: Multidetector CT imaging of the chest was performed using the standard protocol during bolus administration of intravenous contrast. Multiplanar CT image reconstructions and MIPs were obtained to evaluate the vascular anatomy. CONTRAST:  182mL ISOVUE-370 IOPAMIDOL (ISOVUE-370) INJECTION 76% COMPARISON:  Chest x-ray  obtained earlier today 11/18/2017 FINDINGS: Cardiovascular: Adequate opacification of the pulmonary arteries to the segmental level. A filling defect is present in the right main pulmonary artery extending up into the right upper lobe, and down into the right lower lobe pulmonary arteries and subsequent segmental branches consistent with acute pulmonary embolus. A small amount of embolus also extends into the right middle lobe pulmonary artery but this is not occlusive. On the left, thrombus burden is significantly smaller with small left upper and lower lobe segmental and subsegmental pulmonary emboli. The heart remains normal in size. Normal RV LV ratio common no evidence of right heart strain. No pericardial effusion. The aorta is normal in caliber. Conventional 3 vessel arch anatomy. Mediastinum/Nodes: Approximately 1.5 cm low-attenuation nodule within the inferior aspect of the right thyroid gland with peripheral dystrophic calcifications. No evidence of mediastinal or hilar adenopathy. Small hiatal hernia. The thoracic esophagus is otherwise normal in appearance. Lungs/Pleura: Scattered foci of ground-glass  attenuation airspace opacity in a peribronchovascular distribution in the right upper lobe most consistent with mild bronchopneumonia or an atypical infectious/inflammatory process. No evidence of pulmonary infarct, pleural effusion or pneumothorax. Upper Abdomen: Visualized upper abdominal organs are unremarkable. Musculoskeletal: No acute fracture or aggressive appearing lytic or blastic osseous lesion. Review of the MIP images confirms the above findings. IMPRESSION: 1. Positive for moderately large acute pulmonary embolus involving the distal right main pulmonary artery and extending into the right upper and lower lobe pulmonary arteries and their segmental branches. Additional minimal nonocclusive thrombus present within the right middle lobe pulmonary artery, and there are a few small scattered segmental and subsegmental PEs in the left upper and lower lobes. No evidence of right heart strain. 2. Evidence of an infectious/inflammatory process in the right upper lobe, likely mild bronchopneumonia. 3. Right inferior thyroid nodule or cyst. Recommend further evaluation with dedicated thyroid ultrasound if not previously evaluated. 4. Small hiatal hernia. These results were called by telephone at the time of interpretation on 11/18/2017 at 4:52 pm to Dr. Tedra Senegal , who verbally acknowledged these results. Electronically Signed   By: Jacqulynn Cadet M.D.   On: 11/18/2017 16:53        Assessment: Active Problems:   Acute pulmonary embolism without acute cor pulmonale (HCC)  Impression: Acute bilateral pulmonary emboli Moderate clot burden but no right heart strain and hemodynamically stable.  Likely a provoked event with a 10-day prodrome of a upper respiratory illness and signs of a resolving right upper lobe pulmonary infiltrate on CT scan.  No other major risk factors for clotting.  Her weight is a minor risk factor.  No signs or symptoms of a collagen vascular disorder.  Normal CBC baseline  excludes consideration of a myeloproliferative disorder.  No family history of thrombosis.   Recommendation: In view of her young age and moderate clot burden I would recommend a minimum of 6 months of anticoagulation then reevaluate. Given the fact that the clinical trials with the new oral anticoagulants had only a small number of patients at extremes of weight, and the fact that we do not have lab monitoring for these agents to assess therapeutic levels, and given the maximum risk for recurrent thrombosis being about 3 months after the initial event, I would recommend warfarin as the anticoagulation of choice for the first 3 months and then change to a NOAC.  I would prefer apixaban 10 mg twice daily for better steady state dosing in an overweight patient. We discussed that there may be  a small risk using apixaban as the primary anticoagulant at this time.  This risk is not easy to estimate. She asked about another option: Using low molecular weight heparin.  In view of her weight and volume considerations of the product given subcutaneously, she would need to take twice daily dosing.  This is fine if it is her preference but obviously taking injections for the next 3 months would not be the choice that most patients would make.  A "hypercoagulation" panel has been ordered.  In the absence of a strong family history, results rarely influence the clinical decision on type and duration of anticoagulation.  In fact studies done at the time of the acute event may be misleading especially the testing for lupus type anticoagulant which is frequently a false positive in people who are on heparin.  She will take some time to consider her choices.  I am happy to get her established in the Coumadin clinic at [facility] internal medicine department supervised by Dr. Jorene Guest, PhD pharmacist with extensive experience managing anticoagulation along with myself.  We discussed that if she does decide to go with  warfarin, we would make arrangements for home monitoring for her convenience given her busy surgical schedule.    Murriel Hopper, MD, [place]  Hematology-Oncology/Internal Medicine [phone]  11/19/2017, 1:45 PM

## 2017-11-19 NOTE — Progress Notes (Addendum)
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  Approximately 1 week ago, patient had some productive cough, sore throat and fatigue and was treated with 10 days of Levaquin and some steroids.  Patient subsequently felt better but continued to have dyspnea with minimal activity and improved with rest.  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  Of note, patient had a long-haul flight out of the country about 2 months ago.  Patient denies any calf tenderness/swelling, denies taking OCPs, smoking, any history of blood clots, history of miscarriage or malignancy.  Patient reports a cousin with history of clotting disorder.  In the ED, patient was tachycardic, saturating well on room air.  CTA chest showed large right-sided PE involving the right pulmonary artery and segmental branches, EKG shows sinus tachy.  Patient admitted for further management  Today, patient denies any new complaints, still feels short of breath on ambulation, reported some right-sided pleuritic chest pain, denies any left-sided chest pain, abdominal pain, fever/chills, worsening cough, dizziness.  Assessment/Plan: Active Problems:   Acute pulmonary embolism without acute cor pulmonale (HCC)   Acute pulmonary embolism Still SOB on exertion, not requiring O2 at rest CT angio chest showed: Positive for moderately large acute pulmonary embolus involving the distal right main pulmonary artery and extending into the right upper and lower lobe pulmonary arteries and their segmental branches. No evidence of right  heart strain Lower extremity Doppler, negative for DVT, no cystic structure found in the popliteal fossa  Echo done pending report Hypercoagulable panel was done in ED, negative so far Dr.Granfortuna consulted: Discussed extensively with patient on long-term plan for anticoagulation.  Recommending Coumadin for the first 3 months as the studies on NOACs have not been done on patients who overweight/obese.  Recommending a trial of no work for the next 3 months to complete a total of 6 months of anticoagulation.  Patient yet to make a decision Continue therapeutic Lovenox Pain management  Elevated troponin Likely due to above Trended down, EKG with sinus tachy Echo pending report  GERD Continue PPI as needed    Code Status: Full  Family Communication: None at bedside  Disposition Plan: Home once stable   Consultants:  Hematology  Procedures:  None  Antimicrobials:  None  DVT prophylaxis: Therapeutic Lovenox   Objective: Vitals:   11/18/17 2021 11/18/17 2024 11/19/17 0536 11/19/17 1340  BP: 102/80  111/76 127/78  Pulse: 96  71 84  Resp: 18  18 14   Temp: 98.2 F (36.8 C)  98.1 F (36.7 C) 97.8 F (36.6 C)  TempSrc: Oral  Oral Oral  SpO2: 94%  94% 97%  Weight:  (!) 154.4 kg (340 lb 4.8 oz)    Height:  5\' 8"  (1.727 m)      Intake/Output Summary (Last 24 hours) at 11/19/2017 1400 Last data filed at 11/19/2017 0600 Gross per 24 hour  Intake 360 ml  Output -  Net 360 ml   Filed Weights   11/18/17 1713 11/18/17 2024  Weight: 124.7 kg (275 lb) (!) 154.4 kg (340 lb  4.8 oz)    Exam:   General: NAD  Cardiovascular: S1, S2 present  Respiratory: CTAB  Abdomen: Soft, obese, nontender, nondistended, bowel sounds present  Musculoskeletal: No pedal edema bilaterally  Skin: Normal  Psychiatry: Normal mood   Data Reviewed: CBC: Recent Labs  Lab 11/18/17 1802 11/19/17 0537  WBC 8.4 7.0  NEUTROABS 6.2  --   HGB 14.4 13.8  HCT 43.5 42.0  MCV 92.8  93.1  PLT 235 262   Basic Metabolic Panel: Recent Labs  Lab 11/18/17 1802 11/19/17 0537  NA 139 140  K 4.1 4.1  CL 106 107  CO2 22 22  GLUCOSE 111* 98  BUN 13 11  CREATININE 1.07* 0.86  CALCIUM 9.1 8.7*  MG 2.0  --   PHOS 3.4  --    GFR: Estimated Creatinine Clearance: 138.8 mL/min (by C-G formula based on SCr of 0.86 mg/dL). Liver Function Tests: Recent Labs  Lab 11/18/17 1802  AST 20  ALT 20  ALKPHOS 76  BILITOT 0.7  PROT 7.4  ALBUMIN 3.8   No results for input(s): LIPASE, AMYLASE in the last 168 hours. No results for input(s): AMMONIA in the last 168 hours. Coagulation Profile: Recent Labs  Lab 11/18/17 2201  INR 1.02   Cardiac Enzymes: Recent Labs  Lab 11/18/17 1802 11/19/17 0016 11/19/17 0537  TROPONINI 0.14* 0.09* 0.06*   BNP (last 3 results) No results for input(s): PROBNP in the last 8760 hours. HbA1C: No results for input(s): HGBA1C in the last 72 hours. CBG: Recent Labs  Lab 11/19/17 0805  GLUCAP 104*   Lipid Profile: No results for input(s): CHOL, HDL, LDLCALC, TRIG, CHOLHDL, LDLDIRECT in the last 72 hours. Thyroid Function Tests: Recent Labs    11/19/17 0537  TSH 2.026   Anemia Panel: No results for input(s): VITAMINB12, FOLATE, FERRITIN, TIBC, IRON, RETICCTPCT in the last 72 hours. Urine analysis: No results found for: COLORURINE, APPEARANCEUR, LABSPEC, PHURINE, GLUCOSEU, HGBUR, BILIRUBINUR, KETONESUR, PROTEINUR, UROBILINOGEN, NITRITE, LEUKOCYTESUR Sepsis Labs: @LABRCNTIP (procalcitonin:4,lacticidven:4)  )No results found for this or any previous visit (from the past 240 hour(s)).    Studies: Ct Angio Chest Pe W Or Wo Contrast  Result Date: 11/18/2017 CLINICAL DATA:  37 year old female with shortness of breath and a persistent possible upper respiratory infection for the past week. Additional symptoms include coughing and sore throat. EXAM: CT ANGIOGRAPHY CHEST WITH CONTRAST TECHNIQUE: Multidetector CT imaging of the chest was  performed using the standard protocol during bolus administration of intravenous contrast. Multiplanar CT image reconstructions and MIPs were obtained to evaluate the vascular anatomy. CONTRAST:  146mL ISOVUE-370 IOPAMIDOL (ISOVUE-370) INJECTION 76% COMPARISON:  Chest x-ray obtained earlier today 11/18/2017 FINDINGS: Cardiovascular: Adequate opacification of the pulmonary arteries to the segmental level. A filling defect is present in the right main pulmonary artery extending up into the right upper lobe, and down into the right lower lobe pulmonary arteries and subsequent segmental branches consistent with acute pulmonary embolus. A small amount of embolus also extends into the right middle lobe pulmonary artery but this is not occlusive. On the left, thrombus burden is significantly smaller with small left upper and lower lobe segmental and subsegmental pulmonary emboli. The heart remains normal in size. Normal RV LV ratio common no evidence of right heart strain. No pericardial effusion. The aorta is normal in caliber. Conventional 3 vessel arch anatomy. Mediastinum/Nodes: Approximately 1.5 cm low-attenuation nodule within the inferior aspect of the right thyroid gland with peripheral dystrophic calcifications. No evidence of mediastinal or hilar adenopathy.  Small hiatal hernia. The thoracic esophagus is otherwise normal in appearance. Lungs/Pleura: Scattered foci of ground-glass attenuation airspace opacity in a peribronchovascular distribution in the right upper lobe most consistent with mild bronchopneumonia or an atypical infectious/inflammatory process. No evidence of pulmonary infarct, pleural effusion or pneumothorax. Upper Abdomen: Visualized upper abdominal organs are unremarkable. Musculoskeletal: No acute fracture or aggressive appearing lytic or blastic osseous lesion. Review of the MIP images confirms the above findings. IMPRESSION: 1. Positive for moderately large acute pulmonary embolus involving  the distal right main pulmonary artery and extending into the right upper and lower lobe pulmonary arteries and their segmental branches. Additional minimal nonocclusive thrombus present within the right middle lobe pulmonary artery, and there are a few small scattered segmental and subsegmental PEs in the left upper and lower lobes. No evidence of right heart strain. 2. Evidence of an infectious/inflammatory process in the right upper lobe, likely mild bronchopneumonia. 3. Right inferior thyroid nodule or cyst. Recommend further evaluation with dedicated thyroid ultrasound if not previously evaluated. 4. Small hiatal hernia. These results were called by telephone at the time of interpretation on 11/18/2017 at 4:52 pm to Dr. Tedra Sen[place] , who verbally acknowledged these results. Electronically Signed   By: Jacqulynn Cadet M.D.   On: 11/18/2017 16:53    Scheduled Meds: . enoxaparin (LOVENOX) injection  120 mg Subcutaneous Q12H    Continuous Infusions:   LOS: 1 day     Alma Friendly, MD [facility]   If 7PM-7AM, please contact night-coverage [email or web address] Password [email or web address] 11/19/2017, 2:00 PM

## 2017-11-20 DIAGNOSIS — I2699 Other pulmonary embolism without acute cor pulmonale: Principal | ICD-10-CM

## 2017-11-20 LAB — CBC WITH DIFFERENTIAL/PLATELET
Basophils Absolute: 0 10*3/uL (ref 0.0–0.1)
Basophils Relative: 0 %
Eosinophils Absolute: 0.1 10*3/uL (ref 0.0–0.7)
Eosinophils Relative: 1 %
HEMATOCRIT: 42.6 % (ref 36.0–46.0)
HEMOGLOBIN: 14 g/dL (ref 12.0–15.0)
LYMPHS ABS: 2.7 10*3/uL (ref 0.7–4.0)
Lymphocytes Relative: 36 %
MCH: 30.4 pg (ref 26.0–34.0)
MCHC: 32.9 g/dL (ref 30.0–36.0)
MCV: 92.4 fL (ref 78.0–100.0)
MONOS PCT: 6 %
Monocytes Absolute: 0.5 10*3/uL (ref 0.1–1.0)
NEUTROS ABS: 4.3 10*3/uL (ref 1.7–7.7)
NEUTROS PCT: 57 %
Platelets: 232 10*3/uL (ref 150–400)
RBC: 4.61 MIL/uL (ref 3.87–5.11)
RDW: 13.4 % (ref 11.5–15.5)
WBC: 7.5 10*3/uL (ref 4.0–10.5)

## 2017-11-20 LAB — CARDIOLIPIN ANTIBODIES, IGG, IGM, IGA
ANTICARDIOLIPIN IGM: 9 [MPL'U]/mL (ref 0–12)
Anticardiolipin IgA: <9 APL U/mL (ref 0–11)
Anticardiolipin IgG: <9 GPL U/mL (ref 0–14)

## 2017-11-20 LAB — BETA-2-GLYCOPROTEIN I ABS, IGG/M/A: Beta-2-Glycoprotein I IgA: <9 GPI IgA units (ref 0–25)

## 2017-11-20 LAB — GLUCOSE, CAPILLARY: Glucose-Capillary: 113 mg/dL — ABNORMAL HIGH (ref 65–99)

## 2017-11-20 LAB — BASIC METABOLIC PANEL
Anion gap: 8 (ref 5–15)
BUN: 12 mg/dL (ref 6–20)
CHLORIDE: 107 mmol/L (ref 101–111)
CO2: 25 mmol/L (ref 22–32)
CREATININE: 0.86 mg/dL (ref 0.44–1.00)
Calcium: 8.6 mg/dL — ABNORMAL LOW (ref 8.9–10.3)
GFR calc non Af Amer: >60 mL/min (ref >60)
Glucose, Bld: 99 mg/dL (ref 65–99)
POTASSIUM: 4 mmol/L (ref 3.5–5.1)
Sodium: 140 mmol/L (ref 135–145)

## 2017-11-20 LAB — PROTEIN C, TOTAL: Protein C, Total: 115 % (ref 60–150)

## 2017-11-20 MED ORDER — ENOXAPARIN (LOVENOX) PATIENT EDUCATION KIT
PACK | Freq: Once | Status: AC
  Administered 2017-11-20: 12:00:00
  Filled 2017-11-20: qty 1

## 2017-11-20 MED ORDER — ENOXAPARIN SODIUM 150 MG/ML ~~LOC~~ SOLN: 150.0000 mg | Freq: Two times a day (BID) | SUBCUTANEOUS | 0 refills | Status: DC

## 2017-11-20 MED FILL — ENOXAPARIN 150 MG/ML SYR: 150 | 15 days supply | Qty: 30 | Fill #0

## 2017-11-20 NOTE — Progress Notes (Signed)
Patient is stable for discharge. Discharge instructions and medications have been reviewed with the patient and all questions answered. AVS and prescriptions given to patient.  

## 2017-11-20 NOTE — Discharge Summary (Signed)
Physician Discharge Summary  Penny Hancock UDJ:[id number] DOB: 1980-10-04 DOA: 11/18/2017  PCP: Elby Showers, MD  Admit date: 11/18/2017 Discharge date: 11/20/2017  Admitted From: Home Disposition: Home   Recommendations for Outpatient Follow-up:  1. Follow up with PCP in the next month for ongoing Rx for lovenox  2. Follow up with Dr. Beryle Beams for ongoing anticoagulation recommendations. Tentative current plan is for lovenox x3 months followed by at least 3 months of eliquis.   Home Health: None Equipment/Devices: None Discharge Condition: Stable CODE STATUS: Full Diet recommendation: As tolerated.  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  Approximately 1 week ago, patient had some productive cough, sore throat and fatigue and was treated with 10 days of levaquin and some steroids. Patient subsequently felt better but continued to have dyspnea with minimal activity and improved with rest.  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  Of note, patient had a long-haul flight out of the country about 2 months ago. Patient denies any calf tenderness/swelling, denies taking OCPs, smoking, any history of blood clots, history of miscarriage or malignancy.  Patient reports a cousin with history of clotting disorder. In the ED, patient was tachycardic, saturating well on room air. CTA chest showed large right-sided PE involving the distal right main pulmonary artery and distal arteries bilaterally. Lovenox was started and the patient admitted, initially hypoxic with exertion. Hematology, Dr. Beryle Beams was consulted and had detailed discussion of anticoagulation options with the patient who has opted to  continue lovenox BID in lieu of oral anticoagulation (best/only other option would have been coumadin and she prefers to avoid this). She has ambulated without significant dyspnea or and hypoxia on the day of discharge and will follow up with her PCP and/or Dr. Beryle Beams for further evaluation.  Discharge Diagnoses:  Active Problems:   Acute pulmonary embolism without acute cor pulmonale (HCC)  Acute pulmonary embolism: Dyspnea and hypoxia have resolved. Echo showed no evidence of right heart strain, LE dopplers negative. Hypercoagulability panel was sent and is essentially negative.  - Dr. Beryle Beams consulted, recommended lovenox with or without bridge to coumadin for first 3 months followed by eliquis 10mg  po BID for a total of at least 6 months of treatment. The patient has opted against coumadin. Lovenox 150mg  [place] BID prescribed, pt education provided by pharmacy.   Elevated troponin: Due to heart strain in setting of acute PE. Trended downward with evidence of ischemia on ECG and no wall motion abnormalities on echocardiogram.   GERD: Chronic, stable. - Continue PPI as needed  Discharge Instructions Discharge Instructions    Discharge instructions   Complete by:  As directed    You were admitted for pulmonary emboli in both lungs. Fortunately you have recovered well and are stable for discharge with plan to continue anticoagulation with lovenox twice daily. 30 days-worth has been sent to your pharmacy and you will need to follow up with either Dr. Renold Genta or Dr. Beryle Beams for ongoing prescriptions and management within the next 30 days. If you experience bleeding, bruising, etc. or worsening shortness of breath or chest pain seek medical attention right away.     Allergies as of 11/20/2017   No Known  Allergies     Medication List    STOP taking these medications   HYDROcodone-homatropine 5-1.5 MG/5ML syrup Commonly known as:  HYCODAN   levofloxacin 500 MG tablet Commonly  known as:  LEVAQUIN     TAKE these medications   enoxaparin 150 MG/ML injection Commonly known as:  LOVENOX Inject 1 mL (150 mg total) into the skin every 12 (twelve) hours.   esomeprazole 20 MG packet Commonly known as:  NEXIUM Take 20 mg by mouth daily before breakfast. Three times a week      Follow-up Information    Baxley, Cresenciano Lick, MD. Schedule an appointment as soon as possible for a visit in 2 week(s).   Specialty:  Internal Medicine Contact information: [address] [place] [address] [phone]        Annia Belt, MD Follow up.   Specialty:  Oncology Contact information: [place] [address] [phone]          No Known Allergies  Consultations:  Hematology, Dr. Beryle Beams  Procedures/Studies: Dg Chest 2 View  Result Date: 11/18/2017 CLINICAL DATA:  Cough for 1 week EXAM: CHEST - 2 VIEW COMPARISON:  None. FINDINGS: Normal heart size and mediastinal contours. No acute infiltrate or edema. No effusion or pneumothorax. No acute osseous findings. IMPRESSION: Negative chest. Electronically Signed   By: Monte Fantasia M.D.   On: 11/18/2017 12:16   Ct Angio Chest Pe W Or Wo Contrast  Result Date: 11/18/2017 CLINICAL DATA:  37 year old female with shortness of breath and a persistent possible upper respiratory infection for the past week. Additional symptoms include coughing and sore throat. EXAM: CT ANGIOGRAPHY CHEST WITH CONTRAST TECHNIQUE: Multidetector CT imaging of the chest was performed using the standard protocol during bolus administration of intravenous contrast. Multiplanar CT image reconstructions and MIPs were obtained to evaluate the vascular anatomy. CONTRAST:  191mL ISOVUE-370 IOPAMIDOL (ISOVUE-370) INJECTION 76% COMPARISON:  Chest x-ray obtained earlier today 11/18/2017 FINDINGS: Cardiovascular: Adequate opacification of the pulmonary arteries to the segmental level. A filling defect is present in the right  main pulmonary artery extending up into the right upper lobe, and down into the right lower lobe pulmonary arteries and subsequent segmental branches consistent with acute pulmonary embolus. A small amount of embolus also extends into the right middle lobe pulmonary artery but this is not occlusive. On the left, thrombus burden is significantly smaller with small left upper and lower lobe segmental and subsegmental pulmonary emboli. The heart remains normal in size. Normal RV LV ratio common no evidence of right heart strain. No pericardial effusion. The aorta is normal in caliber. Conventional 3 vessel arch anatomy. Mediastinum/Nodes: Approximately 1.5 cm low-attenuation nodule within the inferior aspect of the right thyroid gland with peripheral dystrophic calcifications. No evidence of mediastinal or hilar adenopathy. Small hiatal hernia. The thoracic esophagus is otherwise normal in appearance. Lungs/Pleura: Scattered foci of ground-glass attenuation airspace opacity in a peribronchovascular distribution in the right upper lobe most consistent with mild bronchopneumonia or an atypical infectious/inflammatory process. No evidence of pulmonary infarct, pleural effusion or pneumothorax. Upper Abdomen: Visualized upper abdominal organs are unremarkable. Musculoskeletal: No acute fracture or aggressive appearing lytic or blastic osseous lesion. Review of the MIP images confirms the above findings. IMPRESSION: 1. Positive for moderately large acute pulmonary embolus involving the distal right main pulmonary artery and extending into the right upper and lower lobe pulmonary arteries and their segmental branches. Additional minimal nonocclusive thrombus present within the right middle lobe pulmonary artery,  and there are a few small scattered segmental and subsegmental PEs in the left upper and lower lobes. No evidence of right heart strain. 2. Evidence of an infectious/inflammatory process in the right upper lobe,  likely mild bronchopneumonia. 3. Right inferior thyroid nodule or cyst. Recommend further evaluation with dedicated thyroid ultrasound if not previously evaluated. 4. Small hiatal hernia. These results were called by telephone at the time of interpretation on 11/18/2017 at 4:52 pm to Dr. Tedra Senegal , who verbally acknowledged these results. Electronically Signed   By: Jacqulynn Cadet M.D.   On: 11/18/2017 16:53   Echocardiogram 11/19/2017: - Left ventricle: The cavity size was normal. Systolic function was   normal. The estimated ejection fraction was in the range of 60%   to 65%. Wall motion was normal; there were no regional wall   motion abnormalities. Left ventricular diastolic function   parameters were normal. - Aortic valve: Valve area (VTI): 3.03 cm^2. Valve area (Vmax):   2.84 cm^2. Valve area (Vmean): 3.1 cm^2. - Mitral valve: Calcified annulus.  Left ventricle:  The cavity size was normal. Systolic function was normal. The estimated ejection fraction was in the range of 60% to 65%. Wall motion was normal; there were no regional wall motion abnormalities. The transmitral flow pattern was normal. The deceleration time of the early transmitral flow velocity was normal. The pulmonary vein flow pattern was normal. The tissue Doppler parameters were normal. Left ventricular diastolic function parameters were normal.  ------------------------------------------------------------------- Aortic valve:   Trileaflet; normal thickness leaflets. Mobility was not restricted.  Doppler:  Transvalvular velocity was within the normal range. There was no stenosis. There was no regurgitation. VTI ratio of LVOT to aortic valve: 0.88. Valve area (VTI): 3.03 cm^2. Indexed valve area (VTI): 1.08 cm^2/m^2. Peak velocity ratio of LVOT to aortic valve: 0.82. Valve area (Vmax): 2.84 cm^2. Indexed valve area (Vmax): 1.01 cm^2/m^2. Mean velocity ratio of LVOT to aortic valve: 0.9. Valve area (Vmean): 3.1  cm^2. Indexed valve area (Vmean): 1.1 cm^2/m^2.    Mean gradient (S): 5 mm Hg. Peak gradient (S): 11 mm Hg.  ------------------------------------------------------------------- Aorta:  Aortic root: The aortic root was normal in size.  ------------------------------------------------------------------- Mitral valve:   Calcified annulus. Mobility was not restricted. Doppler:  Transvalvular velocity was within the normal range. There was no evidence for stenosis. There was no regurgitation.    Peak gradient (D): 2 mm Hg.  ------------------------------------------------------------------- Left atrium:  The atrium was normal in size.  ------------------------------------------------------------------- Right ventricle:  The cavity size was normal. Wall thickness was normal. Systolic function was normal.  ------------------------------------------------------------------- Pulmonic valve:    Structurally normal valve.   Cusp separation was normal.  Doppler:  Transvalvular velocity was within the normal range. There was no evidence for stenosis. There was no regurgitation.  ------------------------------------------------------------------- Tricuspid valve:   Structurally normal valve.    Doppler: Transvalvular velocity was within the normal range. There was no regurgitation.  ------------------------------------------------------------------- Pulmonary artery:   The main pulmonary artery was normal-sized. Systolic pressure could not be accurately estimated.  ------------------------------------------------------------------- Right atrium:  The atrium was normal in size.  ------------------------------------------------------------------- Pericardium:  There was no pericardial effusion.  ------------------------------------------------------------------- Systemic veins: Inferior vena cava: The vessel was normal in size.  Subjective: No further dyspnea and no  significant chest pain today. Has walked to the bathroom with SpO2 dipping only to mid-low 90%'s and minimal dyspnea. No bleeding noted.   Discharge Exam: Vitals:   11/19/17 2035 11/20/17 0547  BP: 125/81 113/67  Pulse: 63 62  Resp: 18 16  Temp: 98.1 F (36.7 C) 98.4 F (36.9 C)  SpO2: 99% 99%   General: Pt is alert, awake, not in acute distress Cardiovascular: RRR, S1/S2 +, no rubs, no gallops Respiratory: CTA bilaterally, no wheezing, no rhonchi Abdominal: Soft, NT, ND, bowel sounds + Extremities: No edema, no cyanosis  Labs: Basic Metabolic Panel: Recent Labs  Lab 11/18/17 1802 11/19/17 0537 11/20/17 0447  NA 139 140 140  K 4.1 4.1 4.0  CL 106 107 107  CO2 22 22 25   GLUCOSE 111* 98 99  BUN 13 11 12   CREATININE 1.07* 0.86 0.86  CALCIUM 9.1 8.7* 8.6*  MG 2.0  --   --   PHOS 3.4  --   --    Liver Function Tests: Recent Labs  Lab 11/18/17 1802  AST 20  ALT 20  ALKPHOS 76  BILITOT 0.7  PROT 7.4  ALBUMIN 3.8   CBC: Recent Labs  Lab 11/18/17 1802 11/19/17 0537 11/20/17 0447  WBC 8.4 7.0 7.5  NEUTROABS 6.2  --  4.3  HGB 14.4 13.8 14.0  HCT 43.5 42.0 42.6  MCV 92.8 93.1 92.4  PLT 235 209 232   Cardiac Enzymes: Recent Labs  Lab 11/18/17 1802 11/19/17 0016 11/19/17 0537  TROPONINI 0.14* 0.09* 0.06*   Thyroid function studies Recent Labs    11/19/17 0537  TSH 2.026    Time coordinating discharge: Approximately 40 minutes  Patrecia Pour, MD  [facility] 11/20/2017, 12:09 PM Pager [phone]

## 2017-11-20 NOTE — Progress Notes (Signed)
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 Most of the hypercoagulation profile has returned: Normal protein S, C, and Antithrombin levels. Negative lupus anticoagulant, no antiphospholipid antibodies or antibodies against beta-2 glycoprotein 1. Borderline elevation of plasma homocystine which is irrelevant. Factor V Leiden and prothrombin gene mutation studies are pending but unless she is a homozygote which would be unusual, this will not change management. She is elected to stay on twice daily low molecular weight heparin for now.  She will follow-up with me as an outpatient.

## 2017-11-21 LAB — PROTHROMBIN GENE MUTATION

## 2017-11-21 LAB — FACTOR 5 LEIDEN

## 2017-11-29 ENCOUNTER — Ambulatory Visit (INDEPENDENT_AMBULATORY_CARE_PROVIDER_SITE_OTHER): Payer: 59 | Admitting: Internal Medicine

## 2017-11-29 ENCOUNTER — Encounter: Payer: Self-pay | Admitting: Internal Medicine

## 2017-11-29 VITALS — BP 120/80 | HR 84 | Temp 98.6°F | Wt 273.0 lb

## 2017-11-29 DIAGNOSIS — I2699 Other pulmonary embolism without acute cor pulmonale: Secondary | ICD-10-CM

## 2017-11-29 DIAGNOSIS — I2690 Septic pulmonary embolism without acute cor pulmonale: Secondary | ICD-10-CM

## 2017-11-29 LAB — CBC WITH DIFFERENTIAL/PLATELET
BASOS ABS: 32 {cells}/uL (ref 0–200)
BASOS PCT: 0.5 %
EOS PCT: 0.6 %
Eosinophils Absolute: 38 cells/uL (ref 15–500)
HEMATOCRIT: 40.5 % (ref 35.0–45.0)
HEMOGLOBIN: 13.9 g/dL (ref 11.7–15.5)
LYMPHS ABS: 1940 {cells}/uL (ref 850–3900)
MCH: 30.8 pg (ref 27.0–33.0)
MCHC: 34.3 g/dL (ref 32.0–36.0)
MCV: 89.8 fL (ref 80.0–100.0)
MONOS PCT: 7.3 %
MPV: 11.2 fL (ref 7.5–12.5)
NEUTROS ABS: 3830 {cells}/uL (ref 1500–7800)
Neutrophils Relative %: 60.8 %
Platelets: 314 10*3/uL (ref 140–400)
RBC: 4.51 10*6/uL (ref 3.80–5.10)
RDW: 13 % (ref 11.0–15.0)
Total Lymphocyte: 30.8 %
WBC mixed population: 460 cells/uL (ref 200–950)
WBC: 6.3 10*3/uL (ref 3.8–10.8)

## 2017-11-29 LAB — COMPLETE METABOLIC PANEL WITH GFR
AG Ratio: 1.4 (calc) (ref 1.0–2.5)
ALT: 51 U/L — AB (ref 6–29)
AST: 23 U/L (ref 10–30)
Albumin: 4.1 g/dL (ref 3.6–5.1)
Alkaline phosphatase (APISO): 85 U/L (ref 33–115)
BILIRUBIN TOTAL: 0.5 mg/dL (ref 0.2–1.2)
BUN: 12 mg/dL (ref 7–25)
CALCIUM: 9.2 mg/dL (ref 8.6–10.2)
CHLORIDE: 105 mmol/L (ref 98–110)
CO2: 25 mmol/L (ref 20–32)
Creat: 0.94 mg/dL (ref 0.50–1.10)
GFR, EST AFRICAN AMERICAN: 89 mL/min/{1.73_m2} (ref >=60)
GFR, Est Non African American: 76 mL/min/{1.73_m2} (ref >=60)
GLUCOSE: 94 mg/dL (ref 65–99)
Globulin: 2.9 g/dL (calc) (ref 1.9–3.7)
POTASSIUM: 4.4 mmol/L (ref 3.5–5.3)
Sodium: 139 mmol/L (ref 135–146)
TOTAL PROTEIN: 7 g/dL (ref 6.1–8.1)

## 2017-11-29 NOTE — Progress Notes (Signed)
   Subjective:    Patient ID: Penny Hancock, female    DOB: [birth year], 37 y.o.   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  She is been taking it easy.  Advised her not to push it too much over the next couple of weeks.  Has an appointment see Dr. Beryle Beams soon.    Review of Systems see above     Objective:   Physical Exam Pulse oximetry 98% on room air.  Blood pressure 120/80 with pulse of 84.  Chest clear to auscultation.  Cardiac exam regular rate and rhythm.       Assessment & Plan:  Moderately large acute pulmonary embolus involving the distal right main pulmonary artery and extending into the right upper and lower lobe pulmonary arteries and their segmental branches.  Additional minimal nonocclusive thrombus present within the right middle lobe pulmonary artery.  Also a few small scattered segmental and subsegmental PEs in the left upper and lower lobes.  There was evidence of an infectious/inflammatory process as well perhaps bronchopneumonia but she had recently been on antibiotics.  She gives history of travel to [place] in March.  Prolonged air travel could have provoked PE.  Plan: Continue advancing activity slowly.  Call if any symptoms develop.  Follow-up with Dr. Beryle Beams.  See his hospital consult note and office note.  He has recommended 6 months anticoagulation.  Initial plan was to leave her on Lovenox for 3 months before switching over to a different anticoagulant but she is getting some local reactions apparently to Lovenox.  Therefore he is going to change her over to an oral direct X a inhibitor soon.  We have also arranged for her to see GYN physician,Dr. Edwinna Areola, for GYN checkup in the near future.  25 minutes spent reviewing patient's hospitalization, visit with Dr. Beryle Beams June 24 and seen patient in the office

## 2017-12-04 MED FILL — ENOXAPARIN 150 MG/ML SYR: 150 | 15 days supply | Qty: 30 | Fill #1

## 2017-12-09 ENCOUNTER — Other Ambulatory Visit: Payer: Self-pay

## 2017-12-09 ENCOUNTER — Encounter: Payer: Self-pay | Admitting: Oncology

## 2017-12-09 ENCOUNTER — Other Ambulatory Visit: Payer: Self-pay | Admitting: Oncology

## 2017-12-09 ENCOUNTER — Ambulatory Visit (INDEPENDENT_AMBULATORY_CARE_PROVIDER_SITE_OTHER): Payer: 59 | Admitting: Oncology

## 2017-12-09 VITALS — BP 155/70 | HR 90 | Temp 98.2°F | Ht 68.0 in | Wt 339.8 lb

## 2017-12-09 DIAGNOSIS — I2699 Other pulmonary embolism without acute cor pulmonale: Secondary | ICD-10-CM | POA: Diagnosis not present

## 2017-12-09 DIAGNOSIS — Z7901 Long term (current) use of anticoagulants: Secondary | ICD-10-CM

## 2017-12-09 MED ORDER — APIXABAN 5 MG PO TABS: 5.0000 mg | ORAL_TABLET | Freq: Two times a day (BID) | ORAL | 5 refills | Status: DC

## 2017-12-09 NOTE — Progress Notes (Signed)
Hematology and Oncology Follow Up Visit  Penny Hancock [id number] 1981/03/12 37 y.o. 12/09/2017 2:58 PM   Principle Diagnosis: Encounter Diagnosis  Name Primary?  . 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  Please see my hospital consultation note for complete details of her recent and past medical history.  She had recently been treated for an outpatient pneumonia with antibiotics and steroids.  There was a small area of what appeared to be a residual right upper lobe pulmonary infiltrate on the CT angiogram.  Additional history today: She took a trip with a friend to [place] back in March.  She went on a long hike up a mountain.  At one point she fell down and hurt her right leg.  She began to notice a decrease in her exercise tolerance over the next few weeks with increasing dyspnea on exertion.  She thought she was just deconditioned.  In retrospect, she may have had a DVT related to the trauma and was having pulmonary emboli subsequent to that.  In view of her increased BMI of 52, I did not feel that an initial Xa inhibitor would give her adequate protection against a recurrent event.  We discussed using warfarin at least initially until the.  Of maximum risk was over and then changing to a NOAC.  She has elected to stay on full dose parenteral low molecular weight heparin.  However, now almost a month into it, she is starting to get local skin reactions at the sites of injections.  She believes it is likely related to the preservative in the medicine formulation.  Hypercoagulation profile done in the hospital was unrevealing for any obvious genetic or acquired risk factor,  specifically, no evidence for the presence of antiphospholipid antibodies or a lupus type anticoagulant.  Her respiratory function is 100% back to normal.  In fact oxygen saturation today on room air is 100%.  Her exercise tolerance has improved.  No pain or swelling in her calves and Doppler studies done of her lower extremities during the recent hospitalization were unrevealing for DVT.   She is back to work.   Medications:  Current Outpatient Medications:  .  apixaban (ELIQUIS) 5 MG TABS tablet, Take 1 tablet (5 mg total) by mouth 2 (two) times daily., Disp: 60 tablet, Rfl: 5 (to begin in 10 days when she completes her Lovenox)  .  enoxaparin (LOVENOX) 150 MG/ML injection, Inject 1 mL (150 mg total) into the skin every 12 (twelve) hours., Disp: 60 mL, Rfl: 0 .  esomeprazole (NEXIUM) 20 MG packet, Take 20 mg by mouth daily before breakfast. Three times a week, Disp: , Rfl:   Allergies: No Known Allergies  Review of Systems:  Remaining ROS negative:   Physical Exam: Blood pressure (!) 155/70, pulse 90, temperature 98.2 F (36.8 C), temperature source Oral, height 5\' 8"  (1.727 m), weight (!) 339 lb 12.8 oz (154.1 kg), last menstrual period 11/29/2017, SpO2 100 %. Wt Readings from Last 3 Encounters:  12/09/17 (!) 339 lb 12.8 oz (154.1 kg)  11/29/17 273 lb (123.8 kg)  11/18/17 (!) 340 lb 4.8 oz (154.4 kg)     General appearance: Pleasant well-nourished Caucasian woman HENNT: Pharynx no erythema, exudate, mass, or ulcer.  No thyromegaly or thyroid nodules Lymph nodes: No cervical, supraclavicular, or axillary lymphadenopathy Breasts: nt mass in either breast Lungs: Clear to auscultation, resonant to percussion throughout Heart: Regular rhythm, no murmur, no gallop, no rub, no click, no edema Abdomen: Soft, nontender, normal bowel sounds, no mass, no organomegaly Extremities: No edema, no calf tenderness Calf measurements: 49 cm on the right, 48 cm on the left Ankle measurements: 23.5  on the right 23 on the left Musculoskeletal: no joint deformities GU:  Vascular: Carotid pulses 2+, no bruits Neurologic: Alert, oriented, PERRLA, optic discs sharp and vessels normal, no hemorrhage or exudate, cranial nerves grossly normal, motor strength 5 over 5, reflexes 1+ symmetric, upper body coordination normal, gait normal, Skin: No rash or ecchymosis  Lab Results: CBC W/Diff    Component Value Date/Time   WBC 6.3 11/29/2017 1257   RBC 4.51 11/29/2017 1257   HGB 13.9 11/29/2017 1257   HCT 40.5 11/29/2017 1257   PLT 314 11/29/2017 1257   MCV 89.8 11/29/2017 1257   MCH 30.8 11/29/2017 1257   MCHC 34.3 11/29/2017 1257   RDW 13.0 11/29/2017 1257   LYMPHSABS 1,940 11/29/2017 1257   MONOABS 0.5 11/20/2017 0447   EOSABS 38 11/29/2017 1257   BASOSABS 32 11/29/2017 1257     Chemistry      Component Value Date/Time   NA 139 11/29/2017 1257   K 4.4 11/29/2017 1257   CL 105 11/29/2017 1257   CO2 25 11/29/2017 1257   BUN 12 11/29/2017 1257   CREATININE 0.94 11/29/2017 1257      Component Value Date/Time   CALCIUM 9.2 11/29/2017 1257   ALKPHOS 76 11/18/2017 1802   AST 23 11/29/2017 1257   ALT 51 (H) 11/29/2017 1257   BILITOT 0.5 11/29/2017 1257       Radiological Studies: Dg Chest 2 View  Result Date: 11/18/2017 CLINICAL DATA:  Cough for 1 week EXAM: CHEST - 2 VIEW COMPARISON:  None. FINDINGS: Normal heart size and mediastinal contours. No acute infiltrate or edema. No effusion or pneumothorax. No acute osseous findings. IMPRESSION: Negative chest. Electronically Signed   By: Monte Fantasia M.D.   On: 11/18/2017 12:16   Ct Angio Chest Pe W Or Wo Contrast  Result Date: 11/18/2017 CLINICAL DATA:  37 year old female with shortness of breath and a persistent possible upper respiratory infection for the past week. Additional symptoms include coughing and sore throat. EXAM: CT ANGIOGRAPHY CHEST WITH CONTRAST TECHNIQUE: Multidetector CT imaging of the chest was performed using  the standard protocol during bolus administration of intravenous contrast. Multiplanar CT image reconstructions and MIPs were obtained to evaluate the vascular anatomy. CONTRAST:  150mL ISOVUE-370 IOPAMIDOL (ISOVUE-370) INJECTION 76% COMPARISON:  Chest x-ray obtained earlier today 11/18/2017 FINDINGS: Cardiovascular: Adequate opacification of the pulmonary arteries to the segmental level. A filling defect is present in the right main pulmonary artery extending up into the right upper lobe, and down into the right lower lobe pulmonary arteries and subsequent segmental branches consistent with acute pulmonary embolus. A small amount of embolus also extends into the right middle lobe pulmonary artery but this is not occlusive. On the left, thrombus burden is significantly smaller with small left upper and lower lobe segmental and subsegmental pulmonary emboli. The heart remains normal in size. Normal RV LV ratio common no evidence of right heart strain. No pericardial effusion. The aorta is normal in caliber. Conventional 3 vessel arch anatomy. Mediastinum/Nodes: Approximately 1.5 cm low-attenuation nodule within the inferior aspect of the right  thyroid gland with peripheral dystrophic calcifications. No evidence of mediastinal or hilar adenopathy. Small hiatal hernia. The thoracic esophagus is otherwise normal in appearance. Lungs/Pleura: Scattered foci of ground-glass attenuation airspace opacity in a peribronchovascular distribution in the right upper lobe most consistent with mild bronchopneumonia or an atypical infectious/inflammatory process. No evidence of pulmonary infarct, pleural effusion or pneumothorax. Upper Abdomen: Visualized upper abdominal organs are unremarkable. Musculoskeletal: No acute fracture or aggressive appearing lytic or blastic osseous lesion. Review of the MIP images confirms the above findings. IMPRESSION: 1. Positive for moderately large acute pulmonary embolus involving the distal right  main pulmonary artery and extending into the right upper and lower lobe pulmonary arteries and their segmental branches. Additional minimal nonocclusive thrombus present within the right middle lobe pulmonary artery, and there are a few small scattered segmental and subsegmental PEs in the left upper and lower lobes. No evidence of right heart strain. 2. Evidence of an infectious/inflammatory process in the right upper lobe, likely mild bronchopneumonia. 3. Right inferior thyroid nodule or cyst. Recommend further evaluation with dedicated thyroid ultrasound if not previously evaluated. 4. Small hiatal hernia. These results were called by telephone at the time of interpretation on 11/18/2017 at 4:52 pm to Dr. Tedra Senegal , who verbally acknowledged these results. Electronically Signed   By: Jacqulynn Cadet M.D.   On: 11/18/2017 16:53    Impression:  Acute bilateral pulmonary emboli November 18, 2017 I am going to call this a provoked event which have been run on the tail of a significant respiratory illness/outpatient pneumonia. Question of additional risk factor including recent right leg trauma after a fall.  See discussion above.  Recommendation: I am recommending a total of 6 months of anticoagulation.  Initially she was going to stay on Lovenox injections for the first 3 months.  This is the period off highest risk for a subsequent event.  However she is getting local reactions to the Lovenox.  She has 10 days supply left.  By that time she will be out over 1 month from the PE.  I think it would be reasonable to change her over to an oral Xa inhibitor at that time.  Given her weight, I think she will get better steady state drug levels on the apixaban given on a twice daily dosage scheme. When she comes off all anticoagulation in the fall, I will get a d-dimer test.  If d-dimer is elevated at that point, I will extend her anticoagulation for another 3-6 months.  CC: Patient Care Team: Elby Showers, MD  as PCP - General (Internal Medicine)   Murriel Hopper, MD, [place]  Hematology-Oncology/Internal Medicine     6/24/20192:58 PM

## 2017-12-09 NOTE — Patient Instructions (Signed)
Change to Eliquis 5mg  twice daily when your lovenox runs out and continue to complete 6 total months of treatment from 11/18/17. MD visit in 6 months. Check CBC & d-dimer 1 week before visit

## 2017-12-09 NOTE — Progress Notes (Signed)
Thanks- so glad you saw her.

## 2017-12-15 NOTE — Patient Instructions (Signed)
It was a pleasure to see you today.  We are glad you are better.  Continue to take it easy for the next couple of weeks.  Follow-up in mid July.

## 2017-12-17 ENCOUNTER — Encounter: Payer: Self-pay | Admitting: Obstetrics & Gynecology

## 2017-12-17 ENCOUNTER — Ambulatory Visit (INDEPENDENT_AMBULATORY_CARE_PROVIDER_SITE_OTHER): Payer: 59 | Admitting: Obstetrics & Gynecology

## 2017-12-17 ENCOUNTER — Other Ambulatory Visit (HOSPITAL_COMMUNITY)
Admission: RE | Admit: 2017-12-17 | Discharge: 2017-12-17 | Disposition: A | Discharge status: Home / Self Care | Payer: 59 | Source: Ambulatory Visit | Attending: Obstetrics & Gynecology | Admitting: Obstetrics & Gynecology

## 2017-12-17 ENCOUNTER — Other Ambulatory Visit: Payer: Self-pay

## 2017-12-17 VITALS — BP 142/88 | HR 86 | Resp 14 | Ht 67.0 in | Wt 338.0 lb

## 2017-12-17 DIAGNOSIS — Z01419 Encounter for gynecological examination (general) (routine) without abnormal findings: Secondary | ICD-10-CM | POA: Diagnosis not present

## 2017-12-17 DIAGNOSIS — Z124 Encounter for screening for malignant neoplasm of cervix: Secondary | ICD-10-CM | POA: Insufficient documentation

## 2017-12-17 NOTE — Progress Notes (Signed)
37 y.o. G0P0000 SingleCaucasianF here for annual exam.  Had a DVT after airplane travel and then she had a PE in early June.  Went to [place] which was about a 10 hour flight.  Seeing Dr. Beryle Beams.  Evaluation has thus far been negative.  Is on Lovenox and will transition to Eliquis.  Right now, PE being considered a provoked PE.  Current plan is to be on anticoagulation for 6 months.    Most recent cycle was mildly heavier than prior cycles.  Typically cycles are regular.  D/w pt progesterone methods if cycles become heavier.   Patient's last menstrual period was 11/29/2017.          Sexually active: No.  The current method of family planning is none.    Exercising: Yes.    elliptical, swimming Smoker:  no  Health Maintenance: Pap:  Fall 2017 in [facility] History of abnormal Pap:  no MMG:  never Colonoscopy:  never BMD:   never TDaP:  unsure Pneumonia vaccine(s):  no Shingrix:   no Hep C testing: not indicated Screening Labs: PCP, Hb today: PCP, Urine today: not collected   reports that she has never smoked. She has never used smokeless tobacco. She reports that she drinks alcohol. She reports that she does not use drugs.  Past Medical History:  Diagnosis Date  . GERD (gastroesophageal reflux disease)   . Pulmonary embolism [facility])     Past Surgical History:  Procedure Laterality Date  . broken ankle    . broken wrist    . KNEE SURGERY      Current Outpatient Medications  Medication Sig Dispense Refill  . enoxaparin (LOVENOX) 150 MG/ML injection Inject 1 mL (150 mg total) into the skin every 12 (twelve) hours. 60 mL 0  . esomeprazole (NEXIUM) 20 MG packet Take 20 mg by mouth daily before breakfast. Three times a week    . apixaban (ELIQUIS) 5 MG TABS tablet Take 1 tablet (5 mg total) by mouth 2 (two) times daily. (Patient not taking: Reported on 12/17/2017) 60 tablet 5   No current facility-administered medications for this visit.     Family History  Problem  Relation Age of Onset  . Breast cancer Maternal Aunt   . Heart disease Paternal Grandmother   . Heart disease Paternal Grandfather     Review of Systems  All other systems reviewed and are negative.   Exam:   BP (!) 142/88 (BP Location: Right Arm, Patient Position: Sitting, Cuff Size: Large)   Pulse 86   Resp 14   Ht 5\' 7"  (1.702 m)   Wt (!) 338 lb (153.3 kg)   LMP 11/29/2017   BMI 52.94 kg/m    Height: 5\' 7"  (170.2 cm)  Ht Readings from Last 3 Encounters:  12/17/17 5\' 7"  (1.702 m)  12/09/17 5\' 8"  (1.727 m)  11/18/17 5\' 8"  (1.727 m)    General appearance: alert, cooperative and appears stated age Head: Normocephalic, without obvious abnormality, atraumatic Neck: no adenopathy, supple, symmetrical, trachea midline and thyroid normal to inspection and palpation Lungs: clear to auscultation bilaterally Breasts: normal appearance, no masses or tenderness Heart: regular rate and rhythm Abdomen: soft, non-tender; bowel sounds normal; no masses,  no organomegaly Extremities: extremities normal, atraumatic, no cyanosis or edema Skin: Skin color, texture, turgor normal.  Multiple bruises noted where Lovenox injections have been placed Lymph nodes: Cervical, supraclavicular, and axillary nodes normal. No abnormal inguinal nodes palpated Neurologic: Grossly normal   Pelvic: External genitalia:  no lesions              Urethra:  normal appearing urethra with no masses, tenderness or lesions              Bartholins and Skenes: normal                 Vagina: normal appearing vagina with normal color and discharge, no lesions              Cervix: no lesions              Pap taken: Yes.   Bimanual Exam:  Uterus:  normal size, contour, position, consistency, mobility, non-tender              Adnexa: no mass, fullness, tenderness               Anus:  Not performed, no visible lesions  Chaperone was present for exam.  A:  Well Woman with normal exam Recent PE, currently on  Lovenox Family hx of breast cancer, maternal Aunt  P:   Mammogram guidelines reviewed.  She is planning on starting this at age 58 pap smear and HR HPV obtained today D/w pt screening lab work.  She is planning on having lipid panel obtained with "37 yo labs".  Would also recommend Vit D as well. return annually or prn

## 2017-12-20 LAB — CYTOLOGY - PAP
DIAGNOSIS: NEGATIVE
HPV (WINDOPATH): NOT DETECTED

## 2017-12-26 ENCOUNTER — Encounter: Payer: Self-pay | Admitting: Internal Medicine

## 2017-12-26 ENCOUNTER — Ambulatory Visit (INDEPENDENT_AMBULATORY_CARE_PROVIDER_SITE_OTHER): Payer: 59 | Admitting: Internal Medicine

## 2017-12-26 VITALS — BP 120/80 | HR 80 | Temp 99.1°F | Ht 67.0 in | Wt 338.0 lb

## 2017-12-26 DIAGNOSIS — Z23 Encounter for immunization: Secondary | ICD-10-CM | POA: Diagnosis not present

## 2017-12-26 DIAGNOSIS — Z7901 Long term (current) use of anticoagulants: Secondary | ICD-10-CM

## 2017-12-26 DIAGNOSIS — I2699 Other pulmonary embolism without acute cor pulmonale: Secondary | ICD-10-CM | POA: Diagnosis not present

## 2017-12-26 NOTE — Progress Notes (Signed)
   Subjective:    Patient ID: Penny Hancock, Dr., female    DOB: 11-29-1980, 37 y.o.   MRN: [id number]  HPI She saw Dr. Sabra Heck recently for GYN exam.  Dr. Beryle Beams saw her in late June.  She tried Lovenox injections but began to get local skin reactions at sites of injections.  Dr. Beryle Beams has switched her to Eliquis 5 mg twice daily.  He recommends 6 months of anticoagulation.  She feels well and has had no problems.  She has no upcoming trips.  She is breathing well with no wheezing.    Review of Systems     Objective:   Physical Exam Blood pressure stable at 120/80.  Chest clear.  No significant lower extremity edema.       Assessment & Plan:  Chronic anticoagulation with Eliquis per Dr. Beryle Beams  Pulmonary embolism involving right main pulmonary artery extending into the right  upper and lower lobe pulmonary arteries.  A few small scattered segmental and subsegmental PEs in the left upper and lower lobes.  This may have been a result of a fall on a trip to [place] in March along with long plane ride.  Initially presented with respiratory infection/cough May 23 treated with Depo-Medrol ,Hycodan, and Levaquin but became acutely short of breath on June 2 and noticed O2 sats were dropping on June 3 and came to the office and subsequently was admitted to [facility] after CT angiogram showed evidence of pulmonary emboli.  Pleased with her progress.  Return as needed.  Tdap vaccine given today.

## 2018-01-10 ENCOUNTER — Encounter: Payer: Self-pay | Admitting: Internal Medicine

## 2018-01-10 MED ORDER — DICLOFENAC SODIUM 1 % TD GEL: 2.0000 g | Freq: Two times a day (BID) | TRANSDERMAL | 1 refills | Status: DC

## 2018-01-14 NOTE — Patient Instructions (Signed)
Continue anticoagulation treatment with Dr. Beryle Beams.  Return as needed.  Tdap vaccine given today.

## 2018-04-03 ENCOUNTER — Other Ambulatory Visit: Payer: Self-pay | Admitting: Surgery

## 2018-04-03 DIAGNOSIS — E049 Nontoxic goiter, unspecified: Secondary | ICD-10-CM

## 2018-04-08 ENCOUNTER — Ambulatory Visit
Admission: RE | Admit: 2018-04-08 | Discharge: 2018-04-08 | Disposition: A | Discharge status: Home / Self Care | Payer: 59 | Source: Ambulatory Visit | Attending: Surgery | Admitting: Surgery

## 2018-04-08 DIAGNOSIS — E041 Nontoxic single thyroid nodule: Secondary | ICD-10-CM | POA: Diagnosis not present

## 2018-04-08 DIAGNOSIS — E049 Nontoxic goiter, unspecified: Secondary | ICD-10-CM

## 2018-04-15 ENCOUNTER — Other Ambulatory Visit: Payer: Self-pay | Admitting: Surgery

## 2018-04-15 DIAGNOSIS — E041 Nontoxic single thyroid nodule: Secondary | ICD-10-CM

## 2018-04-22 ENCOUNTER — Encounter: Payer: 59 | Admitting: Obstetrics & Gynecology

## 2018-05-07 ENCOUNTER — Other Ambulatory Visit: Payer: Self-pay | Admitting: Oncology

## 2018-05-08 ENCOUNTER — Ambulatory Visit
Admission: RE | Admit: 2018-05-08 | Discharge: 2018-05-08 | Disposition: A | Discharge status: Home / Self Care | Payer: BLUE CROSS/BLUE SHIELD | Source: Ambulatory Visit | Attending: Surgery | Admitting: Surgery

## 2018-05-08 ENCOUNTER — Other Ambulatory Visit (HOSPITAL_COMMUNITY)
Admission: RE | Admit: 2018-05-08 | Discharge: 2018-05-08 | Disposition: A | Discharge status: Home / Self Care | Payer: BLUE CROSS/BLUE SHIELD | Source: Ambulatory Visit | Attending: Physician Assistant | Admitting: Physician Assistant

## 2018-05-08 DIAGNOSIS — E041 Nontoxic single thyroid nodule: Secondary | ICD-10-CM | POA: Diagnosis not present

## 2018-05-08 DIAGNOSIS — D440 Neoplasm of uncertain behavior of thyroid gland: Secondary | ICD-10-CM | POA: Diagnosis not present

## 2018-05-08 NOTE — Procedures (Signed)
PROCEDURE SUMMARY:  Using direct ultrasound guidance, 5 passes were made using 25 g needles into the nodule within the right lobe of the thyroid.   Ultrasound was used to confirm needle placements on all occasions.   Specimens were sent to Pathology for analysis.  See procedure note under Imaging tab in Epic for full procedure details.  Judie Grieve Madisen Ludvigsen PA-C 05/08/2018 3:43 PM

## 2018-05-13 ENCOUNTER — Other Ambulatory Visit: Payer: Self-pay | Admitting: Oncology

## 2018-05-13 DIAGNOSIS — I2699 Other pulmonary embolism without acute cor pulmonale: Secondary | ICD-10-CM

## 2018-06-05 ENCOUNTER — Ambulatory Visit: Payer: Self-pay | Admitting: Surgery

## 2018-06-06 ENCOUNTER — Other Ambulatory Visit: Payer: Self-pay | Admitting: Oncology

## 2018-06-06 MED ORDER — APIXABAN 5 MG PO TABS: 5.0000 mg | ORAL_TABLET | Freq: Two times a day (BID) | ORAL | 5 refills | Status: DC

## 2018-07-17 ENCOUNTER — Encounter (HOSPITAL_COMMUNITY): Payer: Self-pay

## 2018-07-17 ENCOUNTER — Ambulatory Visit (HOSPITAL_COMMUNITY)
Admission: RE | Admit: 2018-07-17 | Discharge: 2018-07-17 | Disposition: A | Discharge status: Home / Self Care | Payer: BLUE CROSS/BLUE SHIELD | Source: Ambulatory Visit | Attending: Anesthesiology | Admitting: Anesthesiology

## 2018-07-17 ENCOUNTER — Encounter (HOSPITAL_COMMUNITY)
Admission: RE | Admit: 2018-07-17 | Discharge: 2018-07-17 | Disposition: A | Discharge status: Home / Self Care | Payer: BLUE CROSS/BLUE SHIELD | Source: Ambulatory Visit | Attending: Surgery | Admitting: Surgery

## 2018-07-17 ENCOUNTER — Other Ambulatory Visit: Payer: Self-pay

## 2018-07-17 DIAGNOSIS — Z01811 Encounter for preprocedural respiratory examination: Secondary | ICD-10-CM | POA: Diagnosis not present

## 2018-07-17 DIAGNOSIS — Z01818 Encounter for other preprocedural examination: Secondary | ICD-10-CM | POA: Diagnosis not present

## 2018-07-17 HISTORY — DX: Nausea with vomiting, unspecified: R11.2

## 2018-07-17 HISTORY — DX: Other specified postprocedural states: R11.2

## 2018-07-17 HISTORY — DX: Malignant neoplasm of thyroid gland: C73

## 2018-07-17 HISTORY — DX: Other specified postprocedural states: Z98.890

## 2018-07-17 LAB — CBC
HEMATOCRIT: 44.1 % (ref 36.0–46.0)
HEMOGLOBIN: 14.1 g/dL (ref 12.0–15.0)
MCH: 30.9 pg (ref 26.0–34.0)
MCHC: 32 g/dL (ref 30.0–36.0)
MCV: 96.5 fL (ref 80.0–100.0)
Platelets: 306 10*3/uL (ref 150–400)
RBC: 4.57 MIL/uL (ref 3.87–5.11)
RDW: 13.6 % (ref 11.5–15.5)
WBC: 7.4 10*3/uL (ref 4.0–10.5)
nRBC: 0 % (ref 0.0–0.2)

## 2018-07-17 LAB — BASIC METABOLIC PANEL
ANION GAP: 8 (ref 5–15)
BUN: 16 mg/dL (ref 6–20)
CHLORIDE: 109 mmol/L (ref 98–111)
CO2: 22 mmol/L (ref 22–32)
Calcium: 8.6 mg/dL — ABNORMAL LOW (ref 8.9–10.3)
Creatinine, Ser: 0.85 mg/dL (ref 0.44–1.00)
GFR calc non Af Amer: >60 mL/min (ref >60)
Glucose, Bld: 99 mg/dL (ref 70–99)
POTASSIUM: 4.1 mmol/L (ref 3.5–5.1)
SODIUM: 139 mmol/L (ref 135–145)

## 2018-07-17 LAB — HCG, SERUM, QUALITATIVE: Preg, Serum: NEGATIVE

## 2018-07-17 NOTE — Progress Notes (Signed)
PRE-OP APPT PROGRESS NOTE   EKG 11-18-2017 Epic   ECHO 11-2017 Epic   Ellan Lambert, MD 12-26-2017 Epic

## 2018-07-17 NOTE — Patient Instructions (Signed)
Rosario Adie, MD  0/30/0923   Your procedure is scheduled on:  07-24-2018   Report to [facility] Main  Entrance     Report to admitting at 6:30AM    Call this number if you have problems the morning of surgery [phone]     Remember: Do not eat food or drink liquids :After Midnight. BRUSH YOUR TEETH MORNING OF SURGERY AND RINSE YOUR MOUTH OUT, NO CHEWING GUM CANDY OR MINTS.     Take these medicines the morning of surgery with A SIP OF WATER: NONE                                You may not have any metal on your body including hair pins and              piercings  Do not wear jewelry, make-up, lotions, powders or perfumes, deodorant             Do not wear nail polish.  Do not shave  48 hours prior to surgery.           Do not bring valuables to the hospital. [place].  Contacts, dentures or bridgework may not be worn into surgery.  Leave suitcase in the car. After surgery it may be brought to your room.                  Please read over the following fact sheets you were given: _____________________________________________________________________             [facility] - Preparing for Surgery Before surgery, you can play an important role.  Because skin is not sterile, your skin needs to be as free of germs as possible.  You can reduce the number of germs on your skin by washing with CHG (chlorahexidine gluconate) soap before surgery.  CHG is an antiseptic cleaner which kills germs and bonds with the skin to continue killing germs even after washing. Please DO NOT use if you have an allergy to CHG or antibacterial soaps.  If your skin becomes reddened/irritated stop using the CHG and inform your nurse when you arrive at Short Stay. Do not shave (including legs and underarms) for at least 48 hours prior to the first CHG shower.  You may shave your face/neck. Please follow these instructions  carefully:  1.  Shower with CHG Soap the night before surgery and the  morning of Surgery.  2.  If you choose to wash your hair, wash your hair first as usual with your  normal  shampoo.  3.  After you shampoo, rinse your hair and body thoroughly to remove the  shampoo.                           4.  Use CHG as you would any other liquid soap.  You can apply chg directly  to the skin and wash                       Gently with a scrungie or clean washcloth.  5.  Apply the CHG Soap to your body ONLY FROM THE NECK DOWN.   Do not  use on face/ open                           Wound or open sores. Avoid contact with eyes, ears mouth and genitals (private parts).                       Wash face,  Genitals (private parts) with your normal soap.             6.  Wash thoroughly, paying special attention to the area where your surgery  will be performed.  7.  Thoroughly rinse your body with warm water from the neck down.  8.  DO NOT shower/wash with your normal soap after using and rinsing off  the CHG Soap.                9.  Pat yourself dry with a clean towel.            10.  Wear clean pajamas.            11.  Place clean sheets on your bed the night of your first shower and do not  sleep with pets. Day of Surgery : Do not apply any lotions/deodorants the morning of surgery.  Please wear clean clothes to the hospital/surgery center.  FAILURE TO FOLLOW THESE INSTRUCTIONS MAY RESULT IN THE CANCELLATION OF YOUR SURGERY PATIENT SIGNATURE_________________________________  NURSE SIGNATURE__________________________________  ________________________________________________________________________

## 2018-07-18 NOTE — Progress Notes (Addendum)
Pre-op progress note   Patient on Eliquis for hx pulm embolism in May/June 2019 Eliquis managed by her PCP [place] Per patient , her PCP recommended holding Eliquis x1 day pre-op but patient and her surgeon Dr Harlow Asa agreed to hold Eliquis x2 days   Patient chart placed in pa basket for f/u

## 2018-07-19 ENCOUNTER — Encounter (HOSPITAL_COMMUNITY): Payer: Self-pay | Admitting: Surgery

## 2018-07-19 DIAGNOSIS — D440 Neoplasm of uncertain behavior of thyroid gland: Secondary | ICD-10-CM | POA: Diagnosis present

## 2018-07-19 NOTE — H&P (Signed)
General Surgery [facility]  Penny Hancock DOB: [birth year] Single / Language: Penny Hancock / Race: White Female   History of Present Illness   The patient is a 38 year old female who presents with a thyroid nodule.  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 Her hematologist is Penny Hancock. Her primary care physician is Penny Hancock. The patient has a history of right-sided thyroid nodule since medical school. This was found during her physical examination course. She underwent ultrasound and fine-needle aspiration biopsy at [facility]. This showed a benign colloid nodule. This past year the patient had an episode of pulmonary embolism. CT scan of the chest demonstrated a right sided thyroid nodule with calcification and ultrasound was recommended. Patient underwent ultrasound examination showing a solitary 1.7 cm nodule in the right thyroid lobe. Fine-needle aspiration was Bethesda Category V, suspicious for malignancy, possible NIFTP, versus papillary thyroid carcinoma. There was no sign of lymphadenopathy. Left thyroid lobe was normal. TSH level is normal at 2.0. Patient has never been on thyroid medication. She has had no prior head or neck surgery. There is no family history of thyroid disease or endocrine neoplasm. Patient is currently on Eliquis. She has checked with her hematologist and we will plan to hold this medication for 2 days immediately prior to surgery and allow her to restart this medication on the morning following surgery.   Problem List/Past Medical ENLARGED THYROID (E04.9)  RIGHT THYROID NODULE (E04.1)  NEOPLASM OF UNCERTAIN BEHAVIOR OF THYROID GLAND (D44.0)   Past Surgical History Knee Surgery  Right.  Diagnostic Studies History  Colonoscopy  never Mammogram  never Pap Smear  1-5 years ago  Allergies  No Known Allergies  [07/01/2018]:  Medication History  Eliquis (5MG Tablet, Oral) Active.  Social History Alcohol use  Occasional alcohol use. Caffeine use  Coffee. No drug use  Tobacco use  Never smoker.  Family History  Breast Cancer  Family Members In General. Cerebrovascular Accident  Family Members In General. Diabetes Mellitus  Family Members In General. Heart Disease  Family Members In General. Hypertension  Family Members In General. Thyroid problems  Mother.  Pregnancy / Birth History  Age at menarche  39 years. Gravida  0 Para  0 Regular periods   Other Problems Gastroesophageal Reflux Disease  Pulmonary Embolism / Blood Clot in Legs   Review of Systems General Not Present- Appetite Loss, Chills, Fatigue, Fever, Night Sweats, Weight Gain and Weight Loss. Skin Not Present- Change in Wart/Mole, Dryness, Hives, Jaundice, New Lesions, Non-Healing Wounds, Rash and Ulcer. HEENT Not Present- Earache, Hearing Loss, Hoarseness, Nose Bleed, Oral Ulcers, Ringing in the Ears, Seasonal Allergies, Sinus Pain, Sore Throat, Visual Disturbances, Wears glasses/contact lenses and Yellow Eyes. Respiratory Not Present- Bloody sputum, Chronic Cough, Difficulty Breathing, Snoring and Wheezing. Breast Not Present- Breast Mass, Breast Pain, Nipple Discharge and Skin Changes. Cardiovascular Not Present- Chest Pain, Difficulty Breathing Lying Down, Leg Cramps, Palpitations, Rapid Heart Rate, Shortness of Breath and Swelling of Extremities. Gastrointestinal Not Present- Abdominal Pain, Bloating, Bloody Stool, Change in Bowel Habits, Chronic diarrhea, Constipation, Difficulty Swallowing, Excessive gas, Gets full quickly at meals, Hemorrhoids, Indigestion, Nausea, Rectal Pain and Vomiting. Female Genitourinary Not Present- Frequency, Nocturia, Painful Urination, Pelvic Pain and Urgency. Musculoskeletal Not Present- Back Pain, Joint Pain, Joint Stiffness, Muscle Pain, Muscle Weakness and Swelling of  Extremities. Neurological Not Present- Decreased Memory, Fainting, Headaches, Numbness, Seizures, Tingling, Tremor, Trouble  walking and Weakness. Psychiatric Not Present- Anxiety, Bipolar, Change in Sleep Pattern, Depression, Fearful and Frequent crying. Endocrine Not Present- Cold Intolerance, Excessive Hunger, Hair Changes, Heat Intolerance, Hot flashes and New Diabetes. Hematology Not Present- Blood Thinners, Easy Bruising, Excessive bleeding, Gland problems, HIV and Persistent Infections.   Physical Exam  See vital signs recorded above  GENERAL APPEARANCE Development: normal Nutritional status: normal Gross deformities: none  SKIN Rash, lesions, ulcers: none Induration, erythema: none Nodules: none palpable  EYES Conjunctiva and lids: normal Pupils: equal and reactive Iris: normal bilaterally  EARS, NOSE, MOUTH, THROAT External ears: no lesion or deformity External nose: no lesion or deformity Hearing: grossly normal Lips: no lesion or deformity Dentition: normal for age Oral mucosa: moist  NECK Symmetric: yes Trachea: midline Thyroid: Left thyroid lobe without palpable abnormality. Smooth firm rounded mass inferior right thyroid lobe, approximately 1.5 cm, mobile, nontender. No palpable lymphadenopathy.  CHEST Respiratory effort: normal Retraction or accessory muscle use: no Breath sounds: normal bilaterally Rales, rhonchi, wheeze: none  CARDIOVASCULAR Auscultation: regular rhythm, normal rate Murmurs: none Pulses: carotid and radial pulse 2+ palpable Lower extremity edema: none Lower extremity varicosities: none  MUSCULOSKELETAL Station and gait: normal Digits and nails: no clubbing or cyanosis Muscle strength: grossly normal all extremities Range of motion: grossly normal all extremities Deformity: none  LYMPHATIC Cervical: none palpable Supraclavicular: none palpable  PSYCHIATRIC Oriented to person, place, and time: yes Mood and affect: normal  for situation Judgment and insight: appropriate for situation    Assessment & Plan  RIGHT THYROID NODULE (E04.1)  Pt Education - Pamphlet Given - The Thyroid Book: discussed with patient and provided information.  The patient and I met at the office to review her history, her diagnostic studies, and her pathology report. I provided her with written literature on thyroid surgery to review at home.  Based on the findings, I have recommended proceeding with right thyroid lobectomy. The left thyroid lobe looks perfectly normal. The nodule in the right side has been stable over a number of years and hopefully represents a benign tumor, a NIFTP, or a well-differentiated thyroid carcinoma. Given that the size is less than 2 cm, the nodule is solitary, and it does not appear to be any lymph node involvement, thyroid lobectomy should provide for definitive management. We have discussed the risk and benefits of the procedure including the risk of injury to recurrent laryngeal nerve. We have discussed the location and size of the surgical incision. We have discussed the hospital stay and her postoperative recovery. We discussed the potential need for completion thyroidectomy. We have discussed the potential need for lifelong thyroid hormone replacement. We have discussed perioperative management of her anticoagulation. The patient understands all of these issues and wishes to proceed with surgery in the near future.  The risks and benefits of the procedure have been discussed at length with the patient. The patient understands the proposed procedure, potential alternative treatments, and the course of recovery to be expected. All of the patient's questions have been answered at this time. The patient wishes to proceed with surgery.   Armandina Gemma, [facility] Office: [phone]

## 2018-07-21 NOTE — Anesthesia Preprocedure Evaluation (Addendum)
Anesthesia Evaluation  Patient identified by MRN, date of birth, ID band Patient awake    Reviewed: Allergy & Precautions, NPO status , Patient's Chart, lab work & pertinent test results  Airway Mallampati: II  TM Distance: >3 FB Neck ROM: Full    Dental no notable dental hx.    Pulmonary neg pulmonary ROS,    Pulmonary exam normal breath sounds clear to auscultation       Cardiovascular negative cardio ROS Normal cardiovascular exam Rhythm:Regular Rate:Normal     Neuro/Psych negative neurological ROS  negative psych ROS   GI/Hepatic negative GI ROS, Neg liver ROS,   Endo/Other  Morbid obesity  Renal/GU negative Renal ROS  negative genitourinary   Musculoskeletal negative musculoskeletal ROS (+)   Abdominal   Peds negative pediatric ROS (+)  Hematology negative hematology ROS (+)   Anesthesia Other Findings   Reproductive/Obstetrics negative OB ROS                             Anesthesia Physical Anesthesia Plan  ASA: III  Anesthesia Plan: General   Post-op Pain Management:    Induction: Intravenous  PONV Risk Score and Plan: 4 or greater and Scopolamine patch - Pre-op, Midazolam, Ondansetron, Dexamethasone and Treatment may vary due to age or medical condition  Airway Management Planned: Oral ETT  Additional Equipment:   Intra-op Plan:   Post-operative Plan: Extubation in OR  Informed Consent: I have reviewed the patients History and Physical, chart, labs and discussed the procedure including the risks, benefits and alternatives for the proposed anesthesia with the patient or authorized representative who has indicated his/her understanding and acceptance.     Dental advisory given  Plan Discussed with: CRNA and Surgeon  Anesthesia Plan Comments: (See PST note 07/17/18, Konrad Felix, PA-C)       Anesthesia Quick Evaluation

## 2018-07-21 NOTE — Progress Notes (Signed)
Anesthesia Chart Review   Case:  [id number] Date/Time:  07/24/18 0815   Procedure:  TOTAL THYROIDECTOMY (N/A )   Anesthesia type:  General   Pre-op diagnosis:  Thyroid cancer   Location:  [facility] ROOM 04 / [facility]   Surgeon:  Armandina Gemma, MD      DISCUSSION: 38 yo never smoker with h/o PONV, GERD, PE (6/19 on Eliquis), thyroid carcinoma scheduled for above surgery on 07/24/18 with Dr. Armandina Gemma.   Per Dr. Murriel Hopper pt is to hold Eliquis 2 days prior to surgery and restart the morning following surgery.   Pt can proceed with planned procedure barring acute status change.  VS: BP (!) 148/90   Pulse 73   Temp 36.8 C (Oral)   Resp 16   LMP 07/11/2018   SpO2 99%   PROVIDERS: Elby Showers, MD is PCP last seen 12/26/17.   Murriel Hopper, MD is Hematologist  LABS: Labs reviewed: Acceptable for surgery. (all labs ordered are listed, but only abnormal results are displayed)  Labs Reviewed  BASIC METABOLIC PANEL - Abnormal; Notable for the following components:      Result Value   Calcium 8.6 (*)    All other components within normal limits  CBC  HCG, SERUM, QUALITATIVE     IMAGES: Chest Xray 07/17/2018 FINDINGS: Heart and mediastinal contours are within normal limits. No focal opacities or effusions. No acute bony abnormality.  IMPRESSION: No active cardiopulmonary disease.  EKG: 11/18/17 Sinus tachycardia Probable left atrial enlargment Borderline right axis deviation Low voltage, precordial leads Nonspecific T abnormalities, inferior leads No old tracing to compare  CV: Echo 11/19/17 Study Conclusions  - Left ventricle: The cavity size was normal. Systolic function was   normal. The estimated ejection fraction was in the range of 60%   to 65%. Wall motion was normal; there were no regional wall   motion abnormalities. Left ventricular diastolic function   parameters were normal. - Aortic valve: Valve area (VTI): 3.03 cm^2. Valve area (Vmax):   2.84  cm^2. Valve area (Vmean): 3.1 cm^2. - Mitral valve: Calcified annulus. Past Medical History:  Diagnosis Date  . GERD (gastroesophageal reflux disease)    resolved   . PONV (postoperative nausea and vomiting)    mild nausea responsive to meds   . Pulmonary embolism ([place]) 10/2017  . Thyroid carcinoma [facility])     Past Surgical History:  Procedure Laterality Date  . broken wrist    . KNEE SURGERY     arthroscopy     MEDICATIONS: . apixaban (ELIQUIS) 5 MG TABS tablet  . diclofenac sodium (VOLTAREN) 1 % GEL  . enoxaparin (LOVENOX) 150 MG/ML injection   No current facility-administered medications for this encounter.     Maia Plan [facility] Pre-Surgical Testing [phone] 07/21/18 4:21 PM

## 2018-07-23 MED ORDER — DEXTROSE 5 % IV SOLN
3.0000 g | INTRAVENOUS | Status: AC
  Administered 2018-07-24: 3 g via INTRAVENOUS
  Filled 2018-07-23: qty 3

## 2018-07-24 ENCOUNTER — Encounter (HOSPITAL_COMMUNITY)
Admission: RE | Disposition: A | Discharge status: Home / Self Care | Payer: Self-pay | Source: Home / Self Care | Attending: Surgery

## 2018-07-24 ENCOUNTER — Observation Stay (HOSPITAL_COMMUNITY)
Admission: RE | Admit: 2018-07-24 | Discharge: 2018-07-25 | Disposition: A | Discharge status: Home / Self Care | Payer: BLUE CROSS/BLUE SHIELD | Attending: Surgery | Admitting: Surgery

## 2018-07-24 ENCOUNTER — Encounter (HOSPITAL_COMMUNITY): Payer: Self-pay | Admitting: *Deleted

## 2018-07-24 ENCOUNTER — Ambulatory Visit (HOSPITAL_COMMUNITY): Payer: BLUE CROSS/BLUE SHIELD | Admitting: Anesthesiology

## 2018-07-24 ENCOUNTER — Other Ambulatory Visit: Payer: Self-pay

## 2018-07-24 ENCOUNTER — Ambulatory Visit (HOSPITAL_COMMUNITY): Payer: BLUE CROSS/BLUE SHIELD | Admitting: Physician Assistant

## 2018-07-24 DIAGNOSIS — C73 Malignant neoplasm of thyroid gland: Secondary | ICD-10-CM | POA: Diagnosis not present

## 2018-07-24 DIAGNOSIS — Z7901 Long term (current) use of anticoagulants: Secondary | ICD-10-CM | POA: Insufficient documentation

## 2018-07-24 DIAGNOSIS — Z6841 Body Mass Index (BMI) 40.0 and over, adult: Secondary | ICD-10-CM | POA: Diagnosis not present

## 2018-07-24 DIAGNOSIS — Z86711 Personal history of pulmonary embolism: Secondary | ICD-10-CM | POA: Diagnosis not present

## 2018-07-24 DIAGNOSIS — E041 Nontoxic single thyroid nodule: Secondary | ICD-10-CM | POA: Diagnosis present

## 2018-07-24 DIAGNOSIS — D440 Neoplasm of uncertain behavior of thyroid gland: Secondary | ICD-10-CM | POA: Diagnosis present

## 2018-07-24 HISTORY — PX: THYROIDECTOMY: SHX17

## 2018-07-24 LAB — PROTIME-INR
INR: 1
Prothrombin Time: 13.1 seconds (ref 11.4–15.2)

## 2018-07-24 SURGERY — THYROIDECTOMY
Anesthesia: General | Site: Neck | Laterality: Right

## 2018-07-24 MED ORDER — FENTANYL CITRATE (PF) 100 MCG/2ML IJ SOLN
INTRAMUSCULAR | Status: AC
  Filled 2018-07-24: qty 2

## 2018-07-24 MED ORDER — DEXAMETHASONE SODIUM PHOSPHATE 10 MG/ML IJ SOLN
INTRAMUSCULAR | Status: DC | PRN
  Administered 2018-07-24: 8 mg via INTRAVENOUS

## 2018-07-24 MED ORDER — OXYCODONE HCL 5 MG/5ML PO SOLN
5.0000 mg | Freq: Once | ORAL | Status: DC | PRN
  Filled 2018-07-24: qty 5

## 2018-07-24 MED ORDER — MIDAZOLAM HCL 5 MG/5ML IJ SOLN
INTRAMUSCULAR | Status: DC | PRN
  Administered 2018-07-24: 2 mg via INTRAVENOUS

## 2018-07-24 MED ORDER — ONDANSETRON HCL 4 MG/2ML IJ SOLN: 4.0000 mg | Freq: Four times a day (QID) | INTRAMUSCULAR | Status: DC | PRN

## 2018-07-24 MED ORDER — SUGAMMADEX SODIUM 500 MG/5ML IV SOLN
INTRAVENOUS | Status: DC | PRN
  Administered 2018-07-24: 300 mg via INTRAVENOUS

## 2018-07-24 MED ORDER — SUGAMMADEX SODIUM 500 MG/5ML IV SOLN
INTRAVENOUS | Status: AC
  Filled 2018-07-24: qty 5

## 2018-07-24 MED ORDER — PROMETHAZINE HCL 25 MG/ML IJ SOLN
INTRAMUSCULAR | Status: AC
  Filled 2018-07-24: qty 1

## 2018-07-24 MED ORDER — PROMETHAZINE HCL 25 MG/ML IJ SOLN
6.2500 mg | INTRAMUSCULAR | Status: DC | PRN
  Administered 2018-07-24: 6.25 mg via INTRAVENOUS

## 2018-07-24 MED ORDER — PROPOFOL 10 MG/ML IV BOLUS
INTRAVENOUS | Status: DC | PRN
  Administered 2018-07-24: 20 mg via INTRAVENOUS
  Administered 2018-07-24: 250 mg via INTRAVENOUS

## 2018-07-24 MED ORDER — HYDROMORPHONE HCL 1 MG/ML IJ SOLN
INTRAMUSCULAR | Status: AC
  Filled 2018-07-24: qty 1

## 2018-07-24 MED ORDER — KCL IN DEXTROSE-NACL 20-5-0.45 MEQ/L-%-% IV SOLN
INTRAVENOUS | Status: DC
  Administered 2018-07-24 – 2018-07-25 (×2): via INTRAVENOUS
  Filled 2018-07-24 (×2): qty 1000

## 2018-07-24 MED ORDER — TRAMADOL HCL 50 MG PO TABS
50.0000 mg | ORAL_TABLET | Freq: Four times a day (QID) | ORAL | Status: DC | PRN
  Administered 2018-07-24 – 2018-07-25 (×3): 50 mg via ORAL
  Filled 2018-07-24 (×3): qty 1

## 2018-07-24 MED ORDER — ACETAMINOPHEN 650 MG RE SUPP: 650.0000 mg | Freq: Four times a day (QID) | RECTAL | Status: DC | PRN

## 2018-07-24 MED ORDER — OXYCODONE HCL 5 MG PO TABS: 5.0000 mg | ORAL_TABLET | ORAL | Status: DC | PRN

## 2018-07-24 MED ORDER — ONDANSETRON HCL 4 MG/2ML IJ SOLN
INTRAMUSCULAR | Status: DC | PRN
  Administered 2018-07-24: 4 mg via INTRAVENOUS

## 2018-07-24 MED ORDER — FENTANYL CITRATE (PF) 100 MCG/2ML IJ SOLN
25.0000 ug | INTRAMUSCULAR | Status: DC | PRN
  Administered 2018-07-24 (×3): 50 ug via INTRAVENOUS

## 2018-07-24 MED ORDER — SCOPOLAMINE 1 MG/3DAYS TD PT72
1.0000 | MEDICATED_PATCH | Freq: Once | TRANSDERMAL | Status: AC
  Administered 2018-07-24: 1 via TRANSDERMAL

## 2018-07-24 MED ORDER — ACETAMINOPHEN 10 MG/ML IV SOLN
INTRAVENOUS | Status: AC
  Filled 2018-07-24: qty 100

## 2018-07-24 MED ORDER — ACETAMINOPHEN 10 MG/ML IV SOLN
1000.0000 mg | Freq: Once | INTRAVENOUS | Status: AC
  Administered 2018-07-24: 1000 mg via INTRAVENOUS

## 2018-07-24 MED ORDER — ROCURONIUM BROMIDE 100 MG/10ML IV SOLN
INTRAVENOUS | Status: DC | PRN
  Administered 2018-07-24: 20 mg via INTRAVENOUS
  Administered 2018-07-24: 70 mg via INTRAVENOUS

## 2018-07-24 MED ORDER — ONDANSETRON HCL 4 MG/2ML IJ SOLN
INTRAMUSCULAR | Status: AC
  Filled 2018-07-24: qty 2

## 2018-07-24 MED ORDER — SCOPOLAMINE 1 MG/3DAYS TD PT72
MEDICATED_PATCH | TRANSDERMAL | Status: AC
  Filled 2018-07-24: qty 1

## 2018-07-24 MED ORDER — OXYCODONE HCL 5 MG PO TABS: 5.0000 mg | ORAL_TABLET | Freq: Once | ORAL | Status: DC | PRN

## 2018-07-24 MED ORDER — HYDROMORPHONE HCL 1 MG/ML IJ SOLN
1.0000 mg | INTRAMUSCULAR | Status: DC | PRN
  Administered 2018-07-24: 1 mg via INTRAVENOUS
  Filled 2018-07-24: qty 1

## 2018-07-24 MED ORDER — HYDROMORPHONE HCL 1 MG/ML IJ SOLN
0.2500 mg | INTRAMUSCULAR | Status: DC | PRN
  Administered 2018-07-24 (×4): 0.5 mg via INTRAVENOUS

## 2018-07-24 MED ORDER — LACTATED RINGERS IV SOLN
INTRAVENOUS | Status: DC
  Administered 2018-07-24: 07:00:00 via INTRAVENOUS

## 2018-07-24 MED ORDER — FENTANYL CITRATE (PF) 250 MCG/5ML IJ SOLN
INTRAMUSCULAR | Status: AC
  Filled 2018-07-24: qty 5

## 2018-07-24 MED ORDER — CHLORHEXIDINE GLUCONATE CLOTH 2 % EX PADS: 6.0000 | MEDICATED_PAD | Freq: Once | CUTANEOUS | Status: DC

## 2018-07-24 MED ORDER — PROPOFOL 10 MG/ML IV BOLUS
INTRAVENOUS | Status: AC
  Filled 2018-07-24: qty 20

## 2018-07-24 MED ORDER — SODIUM CHLORIDE 0.9 % IR SOLN
Status: DC | PRN
  Administered 2018-07-24: 500 mL

## 2018-07-24 MED ORDER — DOCUSATE SODIUM 100 MG PO CAPS
100.0000 mg | ORAL_CAPSULE | Freq: Two times a day (BID) | ORAL | Status: DC
  Administered 2018-07-24 – 2018-07-25 (×2): 100 mg via ORAL
  Filled 2018-07-24 (×2): qty 1

## 2018-07-24 MED ORDER — ONDANSETRON 4 MG PO TBDP: 4.0000 mg | ORAL_TABLET | Freq: Four times a day (QID) | ORAL | Status: DC | PRN

## 2018-07-24 MED ORDER — ACETAMINOPHEN 325 MG PO TABS
650.0000 mg | ORAL_TABLET | Freq: Four times a day (QID) | ORAL | Status: DC | PRN
  Administered 2018-07-24 – 2018-07-25 (×2): 650 mg via ORAL
  Filled 2018-07-24 (×2): qty 2

## 2018-07-24 MED ORDER — DOCUSATE SODIUM 100 MG PO CAPS
100.0000 mg | ORAL_CAPSULE | ORAL | Status: AC
  Administered 2018-07-24: 100 mg via ORAL
  Filled 2018-07-24: qty 1

## 2018-07-24 MED ORDER — MIDAZOLAM HCL 2 MG/2ML IJ SOLN
INTRAMUSCULAR | Status: AC
  Filled 2018-07-24: qty 2

## 2018-07-24 MED ORDER — FENTANYL CITRATE (PF) 100 MCG/2ML IJ SOLN
INTRAMUSCULAR | Status: DC | PRN
  Administered 2018-07-24 (×3): 50 ug via INTRAVENOUS
  Administered 2018-07-24: 100 ug via INTRAVENOUS
  Administered 2018-07-24 (×2): 50 ug via INTRAVENOUS

## 2018-07-24 MED ORDER — ROCURONIUM BROMIDE 100 MG/10ML IV SOLN
INTRAVENOUS | Status: AC
  Filled 2018-07-24: qty 1

## 2018-07-24 MED ORDER — LIDOCAINE HCL (CARDIAC) PF 100 MG/5ML IV SOSY
PREFILLED_SYRINGE | INTRAVENOUS | Status: DC | PRN
  Administered 2018-07-24: 20 mg via INTRAVENOUS
  Administered 2018-07-24: 80 mg via INTRAVENOUS

## 2018-07-24 MED ORDER — LACTATED RINGERS IV SOLN
INTRAVENOUS | Status: DC | PRN
  Administered 2018-07-24: 07:00:00 via INTRAVENOUS

## 2018-07-24 SURGICAL SUPPLY — 27 items
ATTRACTOMAT 16X20 MAGNETIC DRP (DRAPES) ×2 IMPLANT
BLADE SURG 15 STRL LF DISP TIS (BLADE) ×1 IMPLANT
BLADE SURG 15 STRL SS (BLADE) ×1
CHLORAPREP W/TINT 26ML (MISCELLANEOUS) ×4 IMPLANT
CLIP VESOCCLUDE MED 6/CT (CLIP) ×4 IMPLANT
CLIP VESOCCLUDE SM WIDE 6/CT (CLIP) ×4 IMPLANT
COVER SURGICAL LIGHT HANDLE (MISCELLANEOUS) ×2 IMPLANT
COVER WAND RF STERILE (DRAPES) ×2 IMPLANT
DERMABOND ADVANCED (GAUZE/BANDAGES/DRESSINGS) ×1
DERMABOND ADVANCED .7 DNX12 (GAUZE/BANDAGES/DRESSINGS) ×1 IMPLANT
DRAPE LAPAROTOMY T 98X78 PEDS (DRAPES) ×2 IMPLANT
ELECT PENCIL ROCKER SW 15FT (MISCELLANEOUS) ×2 IMPLANT
ELECT REM PT RETURN 15FT ADLT (MISCELLANEOUS) ×2 IMPLANT
GAUZE 4X4 16PLY RFD (DISPOSABLE) ×2 IMPLANT
GLOVE SURG ORTHO 8.0 STRL STRW (GLOVE) ×2 IMPLANT
GOWN STRL REUS W/TWL XL LVL3 (GOWN DISPOSABLE) ×4 IMPLANT
HEMOSTAT SURGICEL 2X4 FIBR (HEMOSTASIS) ×2 IMPLANT
ILLUMINATOR WAVEGUIDE N/F (MISCELLANEOUS) ×2 IMPLANT
KIT BASIN OR (CUSTOM PROCEDURE TRAY) ×2 IMPLANT
PACK BASIC VI WITH GOWN DISP (CUSTOM PROCEDURE TRAY) ×2 IMPLANT
SHEARS HARMONIC 9CM CVD (BLADE) ×2 IMPLANT
SUT MNCRL AB 4-0 PS2 18 (SUTURE) ×2 IMPLANT
SUT VIC AB 3-0 SH 18 (SUTURE) ×4 IMPLANT
SYR BULB IRRIGATION 50ML (SYRINGE) ×2 IMPLANT
TOWEL OR 17X26 10 PK STRL BLUE (TOWEL DISPOSABLE) ×2 IMPLANT
TOWEL OR NON WOVEN STRL DISP B (DISPOSABLE) ×2 IMPLANT
YANKAUER SUCT BULB TIP 10FT TU (MISCELLANEOUS) ×2 IMPLANT

## 2018-07-24 NOTE — Transfer of Care (Signed)
Immediate Anesthesia Transfer of Care Note  Patient: Penny Adie, MD  Procedure(s) Performed: RIGHT THYROID LOBECTOMY  AND ISTHMUS (Right Neck)  Patient Location: PACU  Anesthesia Type:General  Level of Consciousness: awake, alert  and oriented  Airway & Oxygen Therapy: Patient Spontanous Breathing and Patient connected to face mask oxygen  Post-op Assessment: Report given to RN and Post -op Vital signs reviewed and stable  Post vital signs: Reviewed and stable  Last Vitals:  Vitals Value Taken Time  BP    Temp    Pulse    Resp    SpO2      Last Pain:  Vitals:   07/24/18 0630  TempSrc: Oral  PainSc: 1       Patients Stated Pain Goal: 6 (20/91/98 0221)  Complications: No apparent anesthesia complications

## 2018-07-24 NOTE — Anesthesia Postprocedure Evaluation (Signed)
Anesthesia Post Note  Patient: Penny Adie, MD  Procedure(s) Performed: RIGHT THYROID LOBECTOMY  AND ISTHMUS (Right Neck)     Patient location during evaluation: PACU Anesthesia Type: General Level of consciousness: awake and alert Pain management: pain level controlled Vital Signs Assessment: post-procedure vital signs reviewed and stable Respiratory status: spontaneous breathing, nonlabored ventilation, respiratory function stable and patient connected to nasal cannula oxygen Cardiovascular status: blood pressure returned to baseline and stable Postop Assessment: no apparent nausea or vomiting Anesthetic complications: no    Last Vitals:  Vitals:   07/24/18 0955 07/24/18 1015  BP:  (!) 136/96  Pulse:  80  Resp:  20  Temp: 36.6 C   SpO2: 99% 98%    Last Pain:  Vitals:   07/24/18 1045  TempSrc:   PainSc: 4                  Glora Hulgan S

## 2018-07-24 NOTE — Interval H&P Note (Signed)
History and Physical Interval Note:  07/24/3333 4:56 AM  Penny Adie, MD  has presented today for surgery, with the diagnosis of Thyroid cancer.  The various methods of treatment have been discussed with the patient and family. After consideration of risks, benefits and other options for treatment, the patient has consented to   Right Thyroid Lobectomy as a surgical intervention .    The patient's history has been reviewed, patient examined, no change in status, stable for surgery.  I have reviewed the patient's chart and labs.  Questions were answered to the patient's satisfaction.    Armandina Gemma, [facility] Office: [place]

## 2018-07-24 NOTE — Anesthesia Procedure Notes (Signed)
Procedure Name: Intubation Date/Time: 07/24/2018 8:29 AM Performed by: Glory Buff, CRNA Pre-anesthesia Checklist: Patient identified, Emergency Drugs available, Suction available and Patient being monitored Patient Re-evaluated:Patient Re-evaluated prior to induction Oxygen Delivery Method: Circle system utilized Preoxygenation: Pre-oxygenation with 100% oxygen Induction Type: IV induction Ventilation: Mask ventilation without difficulty Laryngoscope Size: Miller and 3 Grade View: Grade I Tube type: Oral Tube size: 7.0 mm Number of attempts: 1 Airway Equipment and Method: Stylet and Oral airway Placement Confirmation: ETT inserted through vocal cords under direct vision,  positive ETCO2 and breath sounds checked- equal and bilateral Secured at: 21 cm Tube secured with: Tape Dental Injury: Teeth and Oropharynx as per pre-operative assessment

## 2018-07-24 NOTE — Op Note (Signed)
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 Murriel Hopper. Her primary care physician is [place]. The patient has a history of right-sided thyroid nodule since medical school. This was found during her physical examination course. She underwent ultrasound and fine-needle aspiration biopsy at [facility]. This showed a benign colloid nodule. This past year the patient had an episode of pulmonary embolism. CT scan of the chest demonstrated a right sided thyroid nodule with calcification and ultrasound was recommended. Patient underwent ultrasound examination showing a solitary 1.7 cm nodule in the right thyroid lobe. Fine-needle aspiration was Bethesda Category V, suspicious for malignancy, possible NIFTP, versus papillary thyroid carcinoma. There was no sign of lymphadenopathy. Left thyroid lobe was normal. TSH level is normal at 2.0. Patient has never been on thyroid medication. She has had no prior head or neck surgery. There is no family history of thyroid disease or endocrine neoplasm. Patient is currently on Eliquis.  Procedure Details: Procedure was done in OR #4 at the [facility]. The patient was brought to the operating room and placed in a supine position on the operating room table. Following administration of general anesthesia, the patient was positioned and then prepped and draped in the usual aseptic fashion. After ascertaining that an adequate level of anesthesia had been achieved, a small Kocher incision was made with #15 blade. Dissection  was carried through subcutaneous tissues and platysma. Hemostasis was achieved with the electrocautery. Skin flaps were elevated cephalad and caudad from the thyroid notch to the sternal notch. A self-retaining retractor was placed for exposure. Strap muscles were incised in the midline and dissection was begun on the right side. Strap muscles were reflected laterally. The right thyroid lobe was normal in size.  The nodule in question was present in the medial portion or the right lobe and extended into the right side of the isthmus. The lobe was gently mobilized with blunt dissection. Superior pole vessels were dissected out and divided individually between small and medium ligaclips with the harmonic scalpel. The thyroid lobe was rolled anteriorly. Branches of the inferior thyroid artery were divided between small ligaclips with the harmonic scalpel. Inferior venous tributaries were divided between ligaclips. Both the superior and inferior parathyroid glands were identified and preserved on their vascular pedicles. The recurrent laryngeal nerve was identified and preserved along its course. The ligament of Gwenlyn Found was released with the electrocautery and the gland was mobilized onto the anterior trachea. Isthmus was mobilized across the midline. There was a moderate pyramidal lobe present which was resected with the isthmus. The thyroid parenchyma was transected at the junction of the isthmus and contralateral thyroid lobe with the harmonic scalpel. The thyroid lobe and isthmus were submitted to pathology for review.  The neck was irrigated with warm saline. Fibrillar was placed throughout the operative field. Strap muscles were approximated in the midline with interrupted 3-0 Vicryl sutures. Platysma was closed with interrupted 3-0 Vicryl sutures. Skin was closed with a running 4-0 Monocryl subcuticular suture.  Wound was washed and dried and Dermabond was applied. The patient  was awakened from anesthesia and  brought to the recovery room. The patient tolerated the procedure well.   Armandina Gemma, MD [facility] Office: [phone]

## 2018-07-25 ENCOUNTER — Encounter (HOSPITAL_COMMUNITY): Payer: Self-pay | Admitting: Surgery

## 2018-07-25 DIAGNOSIS — Z86711 Personal history of pulmonary embolism: Secondary | ICD-10-CM | POA: Diagnosis not present

## 2018-07-25 DIAGNOSIS — Z6841 Body Mass Index (BMI) 40.0 and over, adult: Secondary | ICD-10-CM | POA: Diagnosis not present

## 2018-07-25 DIAGNOSIS — Z7901 Long term (current) use of anticoagulants: Secondary | ICD-10-CM | POA: Diagnosis not present

## 2018-07-25 DIAGNOSIS — C73 Malignant neoplasm of thyroid gland: Secondary | ICD-10-CM | POA: Diagnosis not present

## 2018-07-25 MED ORDER — TRAMADOL HCL 50 MG PO TABS: 50.0000 mg | ORAL_TABLET | Freq: Four times a day (QID) | ORAL | 0 refills | Status: DC | PRN

## 2018-07-25 NOTE — Progress Notes (Signed)
Discharge instructions discussed with patient and family, verbalized agreeement

## 2018-07-25 NOTE — Discharge Summary (Signed)
Physician Discharge Summary [facility]  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  Admit date: 07/24/2018 Discharge date: 07/25/2018  Admission Diagnoses:  Right thyroid neoplasm of uncertain behavior   Discharge Diagnoses:  Principal Problem:   Neoplasm of uncertain behavior of thyroid gland   Discharged Condition: good  Hospital Course: Patient was admitted for observation following thyroid surgery.  Post op course was uncomplicated.  Pain was well controlled.  Tolerated diet.  Patient was prepared for discharge home on POD#1.  Consults: None  Treatments: surgery: right thyroid lobectomy  Discharge Exam: Blood pressure 124/69, pulse 67, temperature 98.3 F (36.8 C), temperature source Oral, resp. rate 13, height 5\' 8"  (1.727 m), weight (!) 137 kg, last menstrual period 07/11/2018, SpO2 98 %. HEENT - clear Neck - wound dry and intact; minimal STS; minimal ecchymosis; voice normal Chest - clear bilaterally Cor - RRR   Disposition: Home  Discharge Instructions    Diet - low sodium heart healthy   Complete by:  As directed    Discharge instructions   Complete by:  As directed    [facility]  THYROID & PARATHYROID SURGERY:  POST-OP INSTRUCTIONS  Always review your discharge instruction sheet from the facility where your surgery was performed.  A prescription for pain medication may be given to you upon discharge.  Take your pain medication as prescribed.  If narcotic pain medicine is not needed, then you may take acetaminophen (Tylenol) or ibuprofen (Advil) as needed.  Take your usually prescribed medications unless otherwise directed.  If you need a refill on your pain medication, please contact our office during regular business hours.  Prescriptions cannot be processed by our office after 5 pm or on weekends.  Start with a light diet upon arrival home, such as soup and crackers or toast.  Be  sure to drink plenty of fluids daily.  Resume your normal diet the day after surgery.  Most patients will experience some swelling and bruising on the chest and neck area.  Ice packs will help.  Swelling and bruising can take several days to resolve.   It is common to experience some constipation after surgery.  Increasing fluid intake and taking a stool softener (Colace) will usually help or prevent this problem.  A mild laxative (Milk of Magnesia or Miralax) should be taken according to package directions if there has been no bowel movement after 48 hours.  You have steri-strips and a gauze dressing over your incision.  You may remove the gauze bandage on the second day after surgery, and you may shower at that time.  Leave your steri-strips (small skin tapes) in place directly over the incision.  These strips should remain on the skin for 5-7 days and then be removed.  You may get them wet in the shower and pat them dry.  You may resume regular (light) daily activities beginning the next day (such as daily self-care, walking, climbing stairs) gradually increasing activities as tolerated.  You may have sexual intercourse when it is comfortable.  Refrain from any heavy lifting or straining until approved by your doctor.  You may drive when you no longer are taking prescription pain medication, you can comfortably wear a seatbelt, and you can safely maneuver your car and apply brakes.  You should see your doctor in the office for a follow-up appointment approximately three weeks after your surgery.  Make sure that you call for this  appointment within a day or two after you arrive home to insure a convenient appointment time.  WHEN TO CALL YOUR DOCTOR: -- Fever greater than 101.5 -- Inability to urinate -- Nausea and/or vomiting - persistent -- Extreme swelling or bruising -- Continued bleeding from incision -- Increased pain, redness, or drainage from the incision -- Difficulty swallowing or  breathing -- Muscle cramping or spasms -- Numbness or tingling in hands or around lips  The clinic staff is available to answer your questions during regular business hours.  Please don't hesitate to call and ask to speak to one of the nurses if you have concerns.  Armandina Gemma, MD [facility] Office: [phone]   Ice pack   Complete by:  As directed    Increase activity slowly   Complete by:  As directed    No dressing needed   Complete by:  As directed      Allergies as of 07/25/2018   No Known Allergies     Medication List    TAKE these medications   apixaban 5 MG Tabs tablet Commonly known as:  ELIQUIS Take 1 tablet (5 mg total) by mouth 2 (two) times daily.   diclofenac sodium 1 % Gel Commonly known as:  VOLTAREN Apply 2 g topically 2 (two) times daily.   enoxaparin 150 MG/ML injection Commonly known as:  LOVENOX Inject 1 mL (150 mg total) into the skin every 12 (twelve) hours.   traMADol 50 MG tablet Commonly known as:  ULTRAM Take 1-2 tablets (50-100 mg total) by mouth every 6 (six) hours as needed.        Earnstine Regal, MD, [facility] Office: [phone]   Signed: Armandina Gemma 07/25/2018, 10:09 AM

## 2018-08-21 DIAGNOSIS — D440 Neoplasm of uncertain behavior of thyroid gland: Secondary | ICD-10-CM | POA: Diagnosis not present

## 2018-08-21 DIAGNOSIS — D563 Thalassemia minor: Secondary | ICD-10-CM | POA: Diagnosis not present

## 2018-08-21 DIAGNOSIS — E041 Nontoxic single thyroid nodule: Secondary | ICD-10-CM | POA: Diagnosis not present

## 2018-09-01 ENCOUNTER — Other Ambulatory Visit: Payer: Self-pay

## 2018-09-01 ENCOUNTER — Other Ambulatory Visit: Payer: Self-pay | Admitting: Internal Medicine

## 2018-09-01 ENCOUNTER — Ambulatory Visit (INDEPENDENT_AMBULATORY_CARE_PROVIDER_SITE_OTHER): Payer: BLUE CROSS/BLUE SHIELD | Admitting: Internal Medicine

## 2018-09-01 ENCOUNTER — Encounter: Payer: Self-pay | Admitting: Internal Medicine

## 2018-09-01 ENCOUNTER — Encounter: Payer: Self-pay | Admitting: *Deleted

## 2018-09-01 VITALS — Ht 67.0 in

## 2018-09-01 DIAGNOSIS — J029 Acute pharyngitis, unspecified: Secondary | ICD-10-CM | POA: Diagnosis not present

## 2018-09-01 DIAGNOSIS — B2790 Infectious mononucleosis, unspecified without complication: Secondary | ICD-10-CM | POA: Diagnosis not present

## 2018-09-01 DIAGNOSIS — Z7901 Long term (current) use of anticoagulants: Secondary | ICD-10-CM

## 2018-09-01 DIAGNOSIS — Z789 Other specified health status: Secondary | ICD-10-CM | POA: Diagnosis not present

## 2018-09-01 DIAGNOSIS — Z86711 Personal history of pulmonary embolism: Secondary | ICD-10-CM | POA: Diagnosis not present

## 2018-09-01 LAB — POCT RAPID STREP A (OFFICE): Rapid Strep A Screen: NEGATIVE

## 2018-09-01 MED ORDER — DOXYCYCLINE HYCLATE 100 MG PO TABS: 100.0000 mg | ORAL_TABLET | Freq: Two times a day (BID) | ORAL | 0 refills | Status: DC

## 2018-09-01 MED ORDER — HYDROCODONE-HOMATROPINE 5-1.5 MG/5ML PO SYRP: ORAL_SOLUTION | ORAL | 0 refills | Status: DC

## 2018-09-01 NOTE — Progress Notes (Signed)
   Subjective:    Patient ID: Penny Adie, MD, female    DOB: Jan 18, 1981, 38 y.o.   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  Has developed fever and sore throat.  Has malaise and fatigue.  Concerned regarding COVID-19 possible exposure.  Did take flu vaccine.  History of DVT and pulmonary embolus maintained on chronic anticoagulation.    Review of Systems no vomiting or diarrhea     Objective:   Physical Exam Skin warm and dry.  Bilateral submandibular nodes.  Has exudate right tonsil.  Rapid strep screen negative.  Monospot test obtained along with a Epstein-Barr titers.  CBC drawn.  White blood cell count is 12,900.  Coronavirus testing obtained.  Throat culture obtained and proved to be negative.  Mononucleosis screen positive.  Epstein-Barr titers consistent with acute mononucleosis       Assessment & Plan:  Acute mononucleosis  Travel to the [place] on cruciate-coronavirus testing obtained  Plan: Patient will need to be out of work for several days.  She will keep in touch with me via text or telephone.  Given Hycodan 1 teaspoon p.o. every 8 hours as needed sore throat pain.  Started on doxycycline 100 mg twice daily for 10 days awaiting Epstein-Barr virus titers.  Addendum: Patient texted 09/03/2018 and was having considerable issues with swallowing and sore throat pain and was placed on a short tapering course of prednisone going from 60 mg to 0 mg over 7 days.  25 minutes spent evaluating patient and interpreting lab results and contacting patient etc.

## 2018-09-02 LAB — CBC WITH DIFFERENTIAL/PLATELET
Absolute Monocytes: 2103 cells/uL — ABNORMAL HIGH (ref 200–950)
BASOS ABS: 271 {cells}/uL — AB (ref 0–200)
Basophils Relative: 2.1 %
Eosinophils Absolute: 0 cells/uL — ABNORMAL LOW (ref 15–500)
Eosinophils Relative: 0 %
HCT: 39.6 % (ref 35.0–45.0)
Hemoglobin: 13.5 g/dL (ref 11.7–15.5)
Lymphs Abs: 8437 cells/uL — ABNORMAL HIGH (ref 850–3900)
MCH: 31 pg (ref 27.0–33.0)
MCHC: 34.1 g/dL (ref 32.0–36.0)
MCV: 91 fL (ref 80.0–100.0)
MPV: 11 fL (ref 7.5–12.5)
Monocytes Relative: 16.3 %
NEUTROS PCT: 16.2 %
Neutro Abs: 2090 cells/uL (ref 1500–7800)
Platelets: 182 10*3/uL (ref 140–400)
RBC: 4.35 10*6/uL (ref 3.80–5.10)
RDW: 13.5 % (ref 11.0–15.0)
Total Lymphocyte: 65.4 %
WBC: 12.9 10*3/uL — ABNORMAL HIGH (ref 3.8–10.8)

## 2018-09-02 LAB — EPSTEIN-BARR VIRUS VCA ANTIBODY PANEL
EBV NA IgG: <18 U/mL
EBV VCA IgG: 275 U/mL — ABNORMAL HIGH
EBV VCA IgM: >160 U/mL — ABNORMAL HIGH

## 2018-09-02 LAB — EPSTEIN-BARR VIRUS EARLY D ANTIGEN ANTIBODY, IGG

## 2018-09-03 LAB — MONONUCLEOSIS SCREEN: Heterophile, Mono Screen: POSITIVE — AB

## 2018-09-03 LAB — CULTURE, GROUP A STREP
MICRO NUMBER:: 323312
SPECIMEN QUALITY:: ADEQUATE

## 2018-09-03 MED ORDER — PREDNISONE 10 MG PO TABS: ORAL_TABLET | ORAL | 0 refills | Status: DC

## 2018-09-05 LAB — SARS-COV-2 RNA, QUALITATIVE REAL-TIME RT-PCR: SARS CoV 2 RNA, RT PCR: NOT DETECTED

## 2018-09-08 ENCOUNTER — Encounter: Payer: Self-pay | Admitting: General Surgery

## 2018-09-14 NOTE — Patient Instructions (Addendum)
Hycodan as needed for sore throat pain.  Start doxycycline pending mononucleosis testing and coronavirus testing.  Rest and drink plenty of fluids.

## 2018-09-19 DIAGNOSIS — C73 Malignant neoplasm of thyroid gland: Secondary | ICD-10-CM | POA: Diagnosis not present

## 2018-09-19 DIAGNOSIS — I2699 Other pulmonary embolism without acute cor pulmonale: Secondary | ICD-10-CM | POA: Diagnosis not present

## 2018-10-03 ENCOUNTER — Encounter: Payer: Self-pay | Admitting: Obstetrics & Gynecology

## 2018-10-06 ENCOUNTER — Telehealth: Payer: Self-pay | Admitting: Obstetrics & Gynecology

## 2018-10-06 NOTE — Telephone Encounter (Signed)
Spoke with patient. Reports painful menses and heavy bleeding only when she is on blood thinner, currently taking eliquis. Cycles are regular, LMP 09/29/18. No bleeding currently. Denies any other symptoms, would like to further discuss IUD or other alternatives with Dr. Sabra Heck. Advised Dr. Sabra Heck is out of the office, patient agreeable to scheduling OV when Dr. Sabra Heck returns or WebEx visit. Advised will review with provider and return call to schedule. Patient agreeable.

## 2018-10-06 NOTE — Telephone Encounter (Signed)
Hi its Ayn. I hope you are well. I've been put back on eliquis recently and may be on it for a while. I'm having some trouble with my periods and was wondering about that iud you mentioned.  obviously not anything urgent but I'd like to discuss further whenever you have the time.    thanks so much,    Elmo Putt

## 2018-10-08 NOTE — Telephone Encounter (Signed)
Call reviewed with Dr. Sabra Heck, call returned to patient. WebEx visit scheduled for 10/14/18 at 2pm. Advised I will update Dr. Sabra Heck and return call if any additional recommendations. Patient verbalizes understanding and is agreeable.   Routing to provider for final review. Patient is agreeable to disposition. Will close encounter.   Cc: Dr. Sabra Heck

## 2018-10-14 ENCOUNTER — Encounter: Payer: Self-pay | Admitting: Obstetrics & Gynecology

## 2018-10-14 ENCOUNTER — Ambulatory Visit (INDEPENDENT_AMBULATORY_CARE_PROVIDER_SITE_OTHER): Payer: BLUE CROSS/BLUE SHIELD | Admitting: Obstetrics & Gynecology

## 2018-10-14 ENCOUNTER — Other Ambulatory Visit: Payer: Self-pay

## 2018-10-14 DIAGNOSIS — I2699 Other pulmonary embolism without acute cor pulmonale: Secondary | ICD-10-CM

## 2018-10-14 DIAGNOSIS — N920 Excessive and frequent menstruation with regular cycle: Secondary | ICD-10-CM

## 2018-10-14 DIAGNOSIS — Z3009 Encounter for other general counseling and advice on contraception: Secondary | ICD-10-CM | POA: Diagnosis not present

## 2018-10-14 NOTE — Progress Notes (Signed)
Virtual Visit via Video Note  I connected with Rosario Adie, MD on 10/14/18 at  2:00 PM EDT by a video enabled telemedicine application including video.  I was speaking with the correct individual.   History of Present Illness: 38 yo SWF with h/o DVT after traveling.  Was on anticoagulation for six mmonths.  D dimer was still elevated and it was recommended that she restart the Eliquis for at least six more months, maybe longer.  She is also in a serious relationship and would like contraception as well.  She is using the contraceptive sponge at this time.  Cycles are particularly heavy and cramping for the first two to three days each month.  Wants to discuss options for treatment of bleeding and improved contraception.  IUDs, Nexplanon, depo provera, microonor discussed.  Pt knows she should not use any estrogen products.  Feel Kyleena or Mirena IUD would be best option for her.  Risks including uterine perforation and need for possible surgery, increased risk of ectopic pregnancy and need for possible surgery, malpositioned IUD, irregular bleeding/spotting/cramping all reviewed.  Placement reviewed.  Decreased bleeding profile and contraception effectiveness discussed.     Observations/Objective: WNWD WF NAD  Assessment and Plan: Desired improved contraception Menorrhagia H/O DVT, on anticoagulation  Follow Up Instructions: She will call with onset of cycle for IUD placement.  Plan STD testing day of placement as well.     I discussed the assessment and treatment plan with the patient. The patient was provided an opportunity to ask questions and all were answered. The patient agreed with the plan and demonstrated an understanding of the instructions.  I provided 17 minutes of non-face-to-face time during this encounter.   Megan Salon, MD

## 2018-10-20 ENCOUNTER — Telehealth: Payer: Self-pay | Admitting: Obstetrics & Gynecology

## 2018-10-20 NOTE — Telephone Encounter (Signed)
Spoke with patient regarding benefit for a [place] IUD insertion.  Patient understood and agreeable. Patient to call at the onset of her cycle for scheduling.  Forwarding to Dr Sabra Heck. Patient is agreeable to disposition. Will Close encounter

## 2018-10-27 ENCOUNTER — Telehealth: Payer: Self-pay | Admitting: Obstetrics & Gynecology

## 2018-10-27 DIAGNOSIS — Z30430 Encounter for insertion of intrauterine contraceptive device: Secondary | ICD-10-CM

## 2018-10-27 NOTE — Telephone Encounter (Signed)
Patient calling to schedule iud insertion. °

## 2018-10-27 NOTE — Telephone Encounter (Signed)
Spoke with patient. Patient started her menses on 10/26/2018 and is ready to schedule her IUD insertion. Reviewed with Dr.Miller and appointment scheduled for 10/29/2018 at 4 pm. Patient is agreeable to date and time. Pre procedure instructions given.  Motrin instructions given. Motrin=Advil=Ibuprofen, 800 mg one hour before appointment. Eat a meal and hydrate well before appointment. Patient verbalizes understanding.  Routing to provider and will close encounter.

## 2018-10-28 ENCOUNTER — Ambulatory Visit (INDEPENDENT_AMBULATORY_CARE_PROVIDER_SITE_OTHER): Payer: BLUE CROSS/BLUE SHIELD | Admitting: Internal Medicine

## 2018-10-28 ENCOUNTER — Other Ambulatory Visit: Payer: Self-pay

## 2018-10-28 ENCOUNTER — Encounter: Payer: Self-pay | Admitting: Internal Medicine

## 2018-10-28 DIAGNOSIS — C73 Malignant neoplasm of thyroid gland: Secondary | ICD-10-CM | POA: Insufficient documentation

## 2018-10-28 DIAGNOSIS — E890 Postprocedural hypothyroidism: Secondary | ICD-10-CM | POA: Insufficient documentation

## 2018-10-28 NOTE — Progress Notes (Signed)
Patient ID: Penny Adie, MD, female   DOB: 1981-04-25, 38 y.o.   MRN: [id number]   Patient location: Home My location: Office  Referring Provider: Dr. Harlow Asa  I connected with the patient on 10/28/18 at 11:17 AM EDT by a video enabled telemedicine application and verified that I am speaking with the correct person.   I discussed the limitations of evaluation and management by telemedicine and the availability of in person appointments. The patient expressed understanding and agreed to proceed.   Details of the encounter are shown below.  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 Harlow Asa, for management of thyroid cancer and postsurgical hypothyroidism.  Pt. has been dx with papillary thyroid cancer in 07/2018.   Reviewed history: Patient was found to have a right thyroid nodule in [employer] (>1 cm).  She had a FNA (2003): colloid  Chest CT angiogram (11/18/2017) performed during investigation for PE (which was positive): Approximately 1.5 cm low-attenuation nodule within the inferior aspect of the right thyroid gland with peripheral dystrophic calcifications. No evidence of mediastinal or hilar adenopathy.  She then had a thyroid ultrasound (04/09/2018): Location: Right; Inferior Maximum size: 1.8 x 1.7 x 1.5 cm Composition: solid/almost completely solid (2) Echogenicity: isoechoic (1) Echogenic foci: peripheral calcifications (2)  FNA of this nodule (05/08/2018): THYROID, FINE NEEDLE ASPIRATION, RLP (SPECIMEN 1 OF 1, COLLECTED 05/08/18): SUSPICIOUS FOR MALIGNANCY, SEE COMMENT (BETHESDA CATEGORY V).  She had right lobectomy + isthmusectomy (07/24/2018) by Dr. Harlow Asa: Thyroid, lobectomy, right and isthmus - PAPILLARY CARCINOMA, 1.8 CM IN GREATEST DIMENSION. - NO EXTRATHYROIDAL EXTENSION IDENTIFIED. - MARGINS OF RESECTION ARE NOT INVOLVED. - ONE LYMPH NODE, NEGATIVE FOR CARCINOMA (0/1). - SEE ONCOLOGY TABLE. Microscopic Comment THYROID  GLAND: Procedure: Right lobectomy and isthmusectomy Tumor Focality: Unifocal Tumor Site: Right lobe Tumor Size: Greatest dimension: 1.8 cm Histologic Type: Papillary carcinoma, classic (usual, conventional) Margins: Uninvolved by carcinoma Angioinvasion: Not identified Lymphatic Invasion: Not identified Extrathyroidal Extension: Not identified Regional Lymph Nodes: Number of Lymph Nodes Involved: 0 Nodal Levels Involved: Not applicable Size of Largest Metastatic Deposit: Not applicable Extranodal Extension (ENE): Not applicable Number of Lymph Nodes Examined: 1 Nodal Levels Examined: Incidental. The lymph node is present in the soft tissue surrounding the thyroid parenchyma. Pathologic Stage Classification (pTNM, AJCC 8th Edition): pT1b, pN0 Comment: Intradepartmental consultation was obtained (Dr. Tresa Moore). ross and Clinical Information Postsurgical hypothyroidism  She is on levothyroxine 75 mcg daily, taken: - fasting - with water - coffee + cream 30-60 min later - separated by >30 min from b'fast  - no calcium, iron, multivitamins   -+ Nexium prn seldom  I reviewed pt's thyroid tests: 09/2018: TSH ~1 08/2018: TSH ~4 Lab Results  Component Value Date   TSH 2.026 11/19/2017    Pt has: - + fatigue, but also had mononucleosis after the surgery; improving - no weight gain, prev. Losing weight intentionally before the sx (50 lbs) - + cold intolerance >> improved on LT4 - no diarrhea, no constipation - no hair loss - no anxiety depression  Pt denies: - feeling nodules in neck - hoarseness - dysphagia - choking - SOB with lying down  She has + FH of thyroid disorders in: mother, PGM. No FH of thyroid cancer.  No h/o radiation tx to head or neck.  ROS: Constitutional: + see above Eyes: no blurry vision, no xerophthalmia ENT: no sore throat, + see HPI Cardiovascular: no CP/SOB/palpitations/leg swelling Respiratory: no cough/SOB Gastrointestinal: no  N/V/D/C  Musculoskeletal: no muscle/joint aches Skin: no rashes Neurological: no tremors/numbness/tingling/dizziness Psychiatric: no depression/anxiety  Past Medical History:  Diagnosis Date  . GERD (gastroesophageal reflux disease)    resolved   . PONV (postoperative nausea and vomiting)    mild nausea responsive to meds   . Pulmonary embolism ([place]) 10/2017  . Thyroid carcinoma [facility])    Past Surgical History:  Procedure Laterality Date  . broken wrist    . KNEE SURGERY     arthroscopy   . THYROIDECTOMY Right 07/24/2018   Procedure: RIGHT THYROID LOBECTOMY  AND ISTHMUS;  Surgeon: Armandina Gemma, MD;  Location: [facility] ORS;  Service: General;  Laterality: Right;   Social History   Socioeconomic History  . Marital status: Single    Spouse name: Not on file  . Number of children: Not on file  . Years of education: Not on file  . Highest education level: Not on file  Occupational History  . Not on file  Social Needs  . Financial resource strain: Not on file  . Food insecurity:    Worry: Not on file    Inability: Not on file  . Transportation needs:    Medical: Not on file    Non-medical: Not on file  Tobacco Use  . Smoking status: Never Smoker  . Smokeless tobacco: Never Used  Substance and Sexual Activity  . Alcohol use: Yes    Comment: occasional  . Drug use: Never  . Sexual activity: Not Currently    Birth control/protection: Abstinence  Lifestyle  . Physical activity:    Days per week: Not on file    Minutes per session: Not on file  . Stress: Not on file  Relationships  . Social connections:    Talks on phone: Not on file    Gets together: Not on file    Attends religious service: Not on file    Active member of club or organization: Not on file    Attends meetings of clubs or organizations: Not on file    Relationship status: Not on file  . Intimate partner violence:    Fear of current or ex partner: Not on file    Emotionally abused: Not on file     Physically abused: Not on file    Forced sexual activity: Not on file  Other Topics Concern  . Not on file  Social History Narrative  . Not on file   Current Outpatient Medications on File Prior to Visit  Medication Sig Dispense Refill  . apixaban (ELIQUIS) 5 MG TABS tablet Take 1 tablet (5 mg total) by mouth 2 (two) times daily. 60 tablet 5  . diclofenac sodium (VOLTAREN) 1 % GEL Apply 2 g topically 2 (two) times daily. (Patient not taking: Reported on 07/07/2018) 100 g 1  . HYDROcodone-homatropine (HYCODAN) 5-1.5 MG/5ML syrup One tsp po q 8 hours prn cough and or sore throat pain 120 mL 0  . levothyroxine (SYNTHROID) 75 MCG tablet      No current facility-administered medications on file prior to visit.    No Known Allergies Family History  Problem Relation Age of Onset  . Breast cancer Maternal Aunt   . Heart disease Paternal Grandmother   . Heart disease Paternal Grandfather     PE: There were no vitals taken for this visit. Wt Readings from Last 3 Encounters:  07/24/18 (!) 302 lb (137 kg)  12/26/17 (!) 338 lb (153.3 kg)  12/17/17 (!) 338 lb (153.3 kg)   Constitutional:  in NAD  The physical exam was not performed (virtual visit).   ASSESSMENT: 1. Thyroid cancer - see HPI  2. Postsurgical Hypothyroidism  PLAN:  1. Thyroid cancer - papillary - I had a long discussion with the patient about her recent diagnosis of thyroid cancer.  We reviewed together the pathology >> she is stage 1TNM - I reassured her that papillary thyroid cancer is a slow growing cancer with good prognosis; her life expectancy is unlikely to be reduced due to the cancer.  - The tumor was larger than 1.5 cm, however, she did not have any other aggressive features, so I did not recommend completion thyroidectomy or RAI treatment.  We discussed that we will continue to follow her with a neck ultrasound in a year and then thyroglobulin levels and neck ultrasounds as needed. -I explained the purpose of  checking thyroglobulin levels and the fact that they would most likely not be undetectable since she did not have RAI treatment.  I explained that the trend in thyroglobulin levels is the most important parameter to follow for patients that did not have complete thyroidectomy and radioactive iodine ablation.  I will check her thyroglobulin and ATA antibodies at next lab draw.  2. Patient with h/o total thyroidectomy for cancer, now with iatrogenic hypothyroidism, on levothyroxine therapy. She appears euthyroid.  She initially had increased fatigue after surgery, but this coincided with a diagnosis of mononucleosis.  She is now treated for this. Her fatigue improved after starting treatment for mononucleosis but but also after starting levothyroxine.  She also had cold intolerance which resolved with levothyroxine treatment. -For now, we will continue her levothyroxine at 75 mcg daily -This dose, latest TSH was normal, around 1 per her recall - level  obtained last month by Dr. Harlow Asa - We discussed about correct intake of levothyroxine, fasting, with water, separated by at least 30 minutes from breakfast, and separated by more than 4 hours from calcium, iron, multivitamins, acid reflux medications (PPIs). - We discussed about target for TSH after her diagnosis of cancer: In the lower range of normal. - If these are abnormal, she will need to return in 5 weeks for repeat labs - If these are normal, we will recheck them at next visit  Orders Placed This Encounter  Procedures  . TSH  . T4, free  . Thyroglobulin antibody  . Thyroglobulin Level   Philemon Kingdom, MD PhD [facility]

## 2018-10-28 NOTE — Patient Instructions (Signed)
Please come back for labs when safe.  Please continue levothyroxine 75 mcg daily.  Take the thyroid hormone every day, with water, at least 30 minutes before breakfast, separated by at least 4 hours from: - acid reflux medications - calcium - iron - multivitamins  Please come back for a follow-up appointment in 4 months.

## 2018-10-29 ENCOUNTER — Ambulatory Visit: Payer: BLUE CROSS/BLUE SHIELD | Admitting: Obstetrics & Gynecology

## 2018-11-04 ENCOUNTER — Encounter: Payer: Self-pay | Admitting: Obstetrics & Gynecology

## 2018-11-04 ENCOUNTER — Ambulatory Visit (INDEPENDENT_AMBULATORY_CARE_PROVIDER_SITE_OTHER): Payer: BLUE CROSS/BLUE SHIELD | Admitting: Obstetrics & Gynecology

## 2018-11-04 ENCOUNTER — Other Ambulatory Visit: Payer: Self-pay

## 2018-11-04 VITALS — BP 122/70 | HR 78 | Temp 97.9°F | Ht 67.0 in | Wt 298.8 lb

## 2018-11-04 DIAGNOSIS — Z202 Contact with and (suspected) exposure to infections with a predominantly sexual mode of transmission: Secondary | ICD-10-CM | POA: Diagnosis not present

## 2018-11-04 DIAGNOSIS — Z30430 Encounter for insertion of intrauterine contraceptive device: Secondary | ICD-10-CM

## 2018-11-04 LAB — POCT URINE PREGNANCY: Preg Test, Ur: NEGATIVE

## 2018-11-04 NOTE — Patient Instructions (Signed)
IUD Post-procedure Instructions . Cramping is common.  You may take Ibuprofen, Aleve, or Tylenol for the cramping.  This should resolve within 24 hours.   . You may have a small amount of spotting.  You should wear a mini pad for the next few days. . You may have intercourse in 24 hours . Please use a back up method for 7 days (one week). . You need to call the office if you have any pelvic pain, fever, heavy bleeding, or foul smelling vaginal discharge. . Shower or bathe as normal

## 2018-11-04 NOTE — Progress Notes (Addendum)
38 y.o. G0P0000 Single Caucasian female presents for insertion of IUD.  She has hx of DVT and needs progesterone method.  Pt has been counseled about alternative forms of contraception and has She feels IUD is the better option for her.  Pt has also been counseled about risks and benefits as well as complications.  Consent is obtained today.  All questions answered prior to start of procedure.    Current contraception: none, not SA since prior to LMP Last STD testing:  none LMP:  Patient's last menstrual period was 10/26/2018.  Patient Active Problem List   Diagnosis Date Noted  . Postsurgical hypothyroidism 10/28/2018  . Papillary thyroid carcinoma ([place]) 10/28/2018  . Neoplasm of uncertain behavior of thyroid gland 07/19/2018  . Acute pulmonary embolism without acute cor pulmonale ([place]) 11/18/2017   Past Medical History:  Diagnosis Date  . GERD (gastroesophageal reflux disease)    resolved   . PONV (postoperative nausea and vomiting)    mild nausea responsive to meds   . Pulmonary embolism ([place]) 10/2017  . Thyroid carcinoma ([place])    Current Outpatient Medications on File Prior to Visit  Medication Sig Dispense Refill  . apixaban (ELIQUIS) 5 MG TABS tablet Take 1 tablet (5 mg total) by mouth 2 (two) times daily. 60 tablet 5  . levothyroxine (SYNTHROID) 75 MCG tablet Take 75 mcg by mouth daily before breakfast.      No current facility-administered medications on file prior to visit.    Patient has no known allergies.  Review of Systems  Genitourinary: Negative.     Vitals:   11/04/18 1453  BP: 122/70  Pulse: 78  Temp: 97.9 F (36.6 C)  TempSrc: Temporal  Weight: 298 lb 12.8 oz (135.5 kg)  Height: 5\' 7"  (1.702 m)    Gen:  WNWF healthy female NAD Abdomen: soft, non-tender  Pelvic exam: Vulva:  normal female genitalia Vagina:  normal vagina, no discharge, exudate, lesion, or erythema Cervix:  Non-tender, Negative CMT, no lesions or redness.  Procedure:  Speculum  reinserted.  Cervix visualized and cleansed with Betadine x 3.  Paracervical block was not placed.  Single toothed tenaculum applied to anterior lip of cervix without difficulty.  Dilation of cervix was needed with Milex dilator.  Uterus sounded to 9cm.  Lot number: [id number].  Expiration:  08/2020.  Mirena IUD package was opened.  IUD and introducer passed to fundus and then withdrawn slightly before IUD was passed into endometrial cavity.  Introducer removed.  Strings cut to 2cm.  Tenaculum removed from cervix.  Minimal bleeding noted.  Pt tolerated the procedure well.  All instruments removed from vagina.  A: Insertion of Mirena IUD Contraception desires Menorrhagia due to Eliquis and DVT hx  P:  Return for recheck 6-8 weeks GC/Chl/Trich testing obtained today Pt aware to call for any concerns Pt aware removal due no later than 11/04/2023.  IUD card given to pt.

## 2018-11-04 NOTE — Addendum Note (Signed)
Addended by: Megan Salon on: 11/04/2018 03:41 PM   Modules accepted: Orders

## 2018-11-07 LAB — CHLAMYDIA/GONOCOCCUS/TRICHOMONAS, NAA
Chlamydia by NAA: NEGATIVE
Gonococcus by NAA: NEGATIVE
Trich vag by NAA: NEGATIVE

## 2018-11-18 ENCOUNTER — Other Ambulatory Visit (INDEPENDENT_AMBULATORY_CARE_PROVIDER_SITE_OTHER): Payer: BC Managed Care – PPO

## 2018-11-18 ENCOUNTER — Other Ambulatory Visit: Payer: Self-pay

## 2018-11-18 DIAGNOSIS — E890 Postprocedural hypothyroidism: Secondary | ICD-10-CM | POA: Diagnosis not present

## 2018-11-18 DIAGNOSIS — C73 Malignant neoplasm of thyroid gland: Secondary | ICD-10-CM | POA: Diagnosis not present

## 2018-11-18 LAB — TSH: TSH: 1.55 u[IU]/mL (ref 0.35–4.50)

## 2018-11-18 LAB — T4, FREE: Free T4: 1.2 ng/dL (ref 0.60–1.60)

## 2018-11-19 LAB — THYROGLOBULIN LEVEL: Thyroglobulin: 17.7 ng/mL

## 2018-11-19 LAB — THYROGLOBULIN ANTIBODY: Thyroglobulin Ab: 1 IU/mL (ref <=1)

## 2018-12-09 ENCOUNTER — Encounter: Payer: Self-pay | Admitting: Internal Medicine

## 2018-12-16 ENCOUNTER — Encounter: Payer: Self-pay | Admitting: Obstetrics & Gynecology

## 2018-12-16 ENCOUNTER — Other Ambulatory Visit: Payer: Self-pay

## 2018-12-16 ENCOUNTER — Ambulatory Visit (INDEPENDENT_AMBULATORY_CARE_PROVIDER_SITE_OTHER): Payer: BC Managed Care – PPO | Admitting: Obstetrics & Gynecology

## 2018-12-16 VITALS — BP 122/80 | HR 80 | Temp 98.1°F | Ht 67.0 in | Wt 294.0 lb

## 2018-12-16 DIAGNOSIS — Z30431 Encounter for routine checking of intrauterine contraceptive device: Secondary | ICD-10-CM

## 2018-12-16 NOTE — Progress Notes (Signed)
38 y.o. G0P0000 Single Caucasian female presents for followed up after insertion of Mirena on 11/04/18.  Pt reports she is doing well.  She is having spotting.  She did have a more normal cycle in addition to the spotting but the bleeding was much less.  It was more like a typical cycle when she was not on blood thinners so it was much improved.  She is having intermittent cramping but this is very tolerable.  She states "I have no complaints".  Has been SA without any issues or pain.  significant other felt strings once but not since.   LMP:  Patient's last menstrual period was 11/17/2018 (approximate).  Patient Active Problem List   Diagnosis Date Noted  . Postsurgical hypothyroidism 10/28/2018  . Papillary thyroid carcinoma ([place]) 10/28/2018  . Neoplasm of uncertain behavior of thyroid gland 07/19/2018  . Acute pulmonary embolism without acute cor pulmonale ([place]) 11/18/2017   Past Medical History:  Diagnosis Date  . GERD (gastroesophageal reflux disease)    resolved   . PONV (postoperative nausea and vomiting)    mild nausea responsive to meds   . Pulmonary embolism ([place]) 10/2017  . Thyroid carcinoma ([place])    Current Outpatient Medications on File Prior to Visit  Medication Sig Dispense Refill  . apixaban (ELIQUIS) 5 MG TABS tablet Take 1 tablet (5 mg total) by mouth 2 (two) times daily. 60 tablet 5  . levonorgestrel (MIRENA) 20 MCG/24HR IUD 1 each by Intrauterine route once. Placed 11/14/18    . levothyroxine (SYNTHROID) 75 MCG tablet Take 75 mcg by mouth daily before breakfast.      No current facility-administered medications on file prior to visit.    Patient has no known allergies.  Review of Systems  Genitourinary:       Spotting   All other systems reviewed and are negative.  Vitals:   12/16/18 1548  BP: 122/80  Pulse: 80  Temp: 98.1 F (36.7 C)  TempSrc: Temporal  Weight: 294 lb (133.4 kg)  Height: 5\' 7"  (1.702 m)    Gen:  WNWF healthy female NAD Abdomen: soft,  non-tender Groin:  no inguinal nodes palpated  Pelvic exam: Vulva:  normal female genitalia Vagina:  normal vagina and darkish light blood at cervical os Cervix:  Non-tender, Negative CMT, no lesions or redness.  IUD string 2cm.   Uterus:  normal shape, position and consistency    A: IUD follow-up after insertion of Mirena IUD on  on 11/04/2018  P:  Follow-up for AEX. Pt knows to call with any new concerns or problems.

## 2018-12-24 ENCOUNTER — Encounter: Payer: Self-pay | Admitting: Internal Medicine

## 2018-12-24 MED ORDER — LEVOTHYROXINE SODIUM 75 MCG PO TABS: 75.0000 ug | ORAL_TABLET | Freq: Every day | ORAL | 0 refills | Status: DC

## 2018-12-30 ENCOUNTER — Other Ambulatory Visit: Payer: Self-pay | Admitting: Oncology

## 2018-12-30 MED ORDER — APIXABAN 5 MG PO TABS: 5.0000 mg | ORAL_TABLET | Freq: Two times a day (BID) | ORAL | 3 refills | Status: DC

## 2019-03-17 ENCOUNTER — Ambulatory Visit: Payer: BC Managed Care – PPO | Admitting: Internal Medicine

## 2019-03-19 ENCOUNTER — Other Ambulatory Visit: Payer: Self-pay | Admitting: Internal Medicine

## 2019-03-27 ENCOUNTER — Other Ambulatory Visit: Payer: Self-pay | Admitting: Oncology

## 2019-03-27 DIAGNOSIS — I2699 Other pulmonary embolism without acute cor pulmonale: Secondary | ICD-10-CM

## 2019-03-31 ENCOUNTER — Telehealth: Payer: Self-pay | Admitting: Oncology

## 2019-03-31 NOTE — Telephone Encounter (Signed)
Faxed medical records to Dr. Odis Hollingshead at [phone]. Release [id number]

## 2019-04-07 ENCOUNTER — Other Ambulatory Visit: Payer: Self-pay

## 2019-04-07 ENCOUNTER — Ambulatory Visit (INDEPENDENT_AMBULATORY_CARE_PROVIDER_SITE_OTHER): Payer: BC Managed Care – PPO | Admitting: Internal Medicine

## 2019-04-07 ENCOUNTER — Encounter: Payer: Self-pay | Admitting: Internal Medicine

## 2019-04-07 VITALS — BP 120/80 | HR 95 | Temp 98.2°F | Ht 68.0 in | Wt 282.0 lb

## 2019-04-07 DIAGNOSIS — Z85850 Personal history of malignant neoplasm of thyroid: Secondary | ICD-10-CM

## 2019-04-07 DIAGNOSIS — Z6841 Body Mass Index (BMI) 40.0 and over, adult: Secondary | ICD-10-CM

## 2019-04-07 DIAGNOSIS — Z0000 Encounter for general adult medical examination without abnormal findings: Secondary | ICD-10-CM | POA: Diagnosis not present

## 2019-04-07 DIAGNOSIS — Z86711 Personal history of pulmonary embolism: Secondary | ICD-10-CM

## 2019-04-07 DIAGNOSIS — Z7901 Long term (current) use of anticoagulants: Secondary | ICD-10-CM | POA: Diagnosis not present

## 2019-04-07 DIAGNOSIS — Z23 Encounter for immunization: Secondary | ICD-10-CM

## 2019-04-07 LAB — POCT URINALYSIS DIPSTICK
Bilirubin, UA: NEGATIVE
Glucose, UA: NEGATIVE
Leukocytes, UA: NEGATIVE
Nitrite, UA: NEGATIVE
Protein, UA: NEGATIVE
Spec Grav, UA: >=1.03 — AB (ref 1.010–1.025)
Urobilinogen, UA: 0.2 E.U./dL
pH, UA: 5 (ref 5.0–8.0)

## 2019-04-07 NOTE — Progress Notes (Signed)
Subjective:    Patient ID: Penny Adie, MD, female    DOB: 1980/08/12, 38 y.o.   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  In February 2020 was diagnosed with papillary thyroid cancer and underwent right thyroid lobectomy by Dr. Harlow Asa.  Now being followed by Dr. Renne Crigler, Endocrinologist.  History of bilateral pulmonary emboli requiring admission to the hospital with acute dyspnea and hypoxemia June 2019 and is on chronic anticoagulation.  This may have been related to trip to [place] in March 2019.  She presented to the office in May 2019 complaining of respiratory infection symptoms that were  protracted with cough, some stridor and sore throat.  At one point fell down and hurt her right leg.  Now being seen by Dr. Benay Spice as Dr. Beryle Beams retired.  Now on Eliquis.  Says she is being referred to Hematologist at [facility]. Dr. Sabra Heck is GYN physician.  Has IUD for contraception.  Has been on keto diet has lost about 56 pounds since last year.  She will have fasting labs in the near future.  Diagnosed with mononucleosis in March 2020 after a trip to the [place].  Remote history of fractured ankle, fractured wrist and knee surgery  Social history: She is single.  Social alcohol consumption.  Has steady female partner.  Non-smoker.  Family history: Parents and twin brothers in good health  Review of Systems feels well with no new complaints.  Has more energy with weight loss.  No shortness of breath or respiratory distress.     Objective:   Physical Exam Pulse oximetry 99% on room air, blood pressure 120/80, pulse 95, she is afebrile.  Weight 292 pounds.  BMI 42.88 TMs are clear.  Neck is supple.  There is well-healed surgical scar.  Chest is clear to auscultation.  Breast without masses.  Cardiac exam regular rate and rhythm normal S1 and S2.  GYN exam deferred to GYN physician.  No pitting edema of the  lower extremities.  Neuro no focal deficits.      Assessment & Plan:  History of bilateral pulmonary emboli in 2019 maintained on chronic anticoagulation with Eliquis and is to see coagulation specialist at [facility] in the near future.  Currently being followed here by Dr. Benay Spice  Morbid obesity has lost 56 pounds over the past year on keto diet  Mononucleosis Spring 2020  History of papillary carcinoma of thyroid status post surgical removal and now on thyroid replacement medication.  Nodule was 1.8 cm in size right inferior lobe.  Thyroid nodule was seen on CT of chest.  Plan: She is to have fasting CBC, C met lipid panel TSH and free T4 on her next day off in the near future.  Flu vaccine given.

## 2019-04-15 ENCOUNTER — Telehealth: Payer: Self-pay | Admitting: *Deleted

## 2019-04-15 NOTE — Telephone Encounter (Addendum)
Left VM to inquire if she has heard from [facility] regarding referral to Dr. Joan Flores? No appointment noted. Sent staff message to HIM to f/u again (records faxed on 03/31/19). Medical records will refax referral today.

## 2019-04-15 NOTE — Patient Instructions (Signed)
It was a pleasure to see you today.  Please have fasting labs in the near future.  Flu vaccine given.  Congratulations on weight loss.

## 2019-04-20 ENCOUNTER — Encounter: Payer: Self-pay | Admitting: Internal Medicine

## 2019-04-20 ENCOUNTER — Other Ambulatory Visit: Payer: Self-pay

## 2019-04-20 ENCOUNTER — Ambulatory Visit (INDEPENDENT_AMBULATORY_CARE_PROVIDER_SITE_OTHER): Payer: BC Managed Care – PPO | Admitting: Internal Medicine

## 2019-04-20 VITALS — BP 128/80 | HR 90 | Ht 68.0 in | Wt 287.0 lb

## 2019-04-20 DIAGNOSIS — C73 Malignant neoplasm of thyroid gland: Secondary | ICD-10-CM | POA: Diagnosis not present

## 2019-04-20 DIAGNOSIS — E890 Postprocedural hypothyroidism: Secondary | ICD-10-CM

## 2019-04-20 NOTE — Patient Instructions (Addendum)
Please come back for labs 2 weeks after stopping the B vitamins.  Please continue levothyroxine 75 mcg daily.  Take the thyroid hormone every day, with water, at least 30 minutes before breakfast, separated by at least 4 hours from: - acid reflux medications - calcium - iron - multivitamins  Please send me a message in 07/2019 to order the new neck ultrasound.  Please come back for a follow-up appointment in 5-6 months.

## 2019-04-20 NOTE — Progress Notes (Signed)
Patient ID: Penny Adie, MD, female   DOB: 09/20/1980, 38 y.o.   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 Harlow Asa, returning for follow-up for thyroid cancer and postsurgical hypothyroidism.  Last visit 5 months ago (virtual).  Since last visit, she started a keto diet and she started to have hair loss.  She started a hair skin and nails vitamin with 5000 mcg of biotin.  She continues this, last dose today.  She does feel that this is helping her hair.  Reviewed and addended history: Pt. has been dx with papillary thyroid cancer in 07/2018.   Reviewed history: Patient was found to have a right thyroid nodule in [employer] School (>1 cm).  She had a FNA (2003): colloid  Chest CT angiogram (11/18/2017) performed during investigation for PE (which was positive): Approximately 1.5 cm low-attenuation nodule within the inferior aspect of the right thyroid gland with peripheral dystrophic calcifications. No evidence of mediastinal or hilar adenopathy.  She then had a thyroid ultrasound (04/09/2018): Location: Right; Inferior Maximum size: 1.8 x 1.7 x 1.5 cm Composition: solid/almost completely solid (2) Echogenicity: isoechoic (1) Echogenic foci: peripheral calcifications (2)  FNA of this nodule (05/08/2018): THYROID, FINE NEEDLE ASPIRATION, RLP (SPECIMEN 1 OF 1, COLLECTED 05/08/18): SUSPICIOUS FOR MALIGNANCY, SEE COMMENT ([place] CATEGORY V).  She had right lobectomy + isthmusectomy (07/24/2018) by Dr. Harlow Asa: Thyroid, lobectomy, right and isthmus - PAPILLARY CARCINOMA, 1.8 CM IN GREATEST DIMENSION. - NO EXTRATHYROIDAL EXTENSION IDENTIFIED. - MARGINS OF RESECTION ARE NOT INVOLVED. - ONE LYMPH NODE, NEGATIVE FOR CARCINOMA (0/1). - SEE ONCOLOGY TABLE. Microscopic Comment THYROID GLAND: Procedure: Right lobectomy and isthmusectomy Tumor Focality: Unifocal Tumor Site: Right lobe Tumor Size: Greatest dimension: 1.8 cm Histologic Type:  Papillary carcinoma, classic (usual, conventional) Margins: Uninvolved by carcinoma Angioinvasion: Not identified Lymphatic Invasion: Not identified Extrathyroidal Extension: Not identified Regional Lymph Nodes: Number of Lymph Nodes Involved: 0 Nodal Levels Involved: Not applicable Size of Largest Metastatic Deposit: Not applicable Extranodal Extension (ENE): Not applicable Number of Lymph Nodes Examined: 1 Nodal Levels Examined: Incidental. The lymph node is present in the soft tissue surrounding the thyroid parenchyma. Pathologic Stage Classification (pTNM, AJCC 8th Edition): pT1b, pN0 Comment: Intradepartmental consultation was obtained (Dr. Tresa Moore). ross and Lab Results  Component Value Date   THYROGLB 17.7 11/18/2018   Lab Results  Component Value Date   THGAB 1 11/18/2018   linical Information Postsurgical hypothyroidism  Pt is on levothyroxine 75 mcg daily, taken: - in am - fasting - Coffee with cream 30 to 60 minutes later - at least 30 min from b'fast - no Ca, Fe, MVI, PPIs - + on Biotin (5000 mcg)!  Reviewed patient's TFTs: Lab Results  Component Value Date   TSH 1.55 11/18/2018   TSH 2.026 11/19/2017   FREET4 1.20 11/18/2018  09/2018: TSH ~1 08/2018: TSH ~4  Pt denies: - feeling nodules in neck - hoarseness - dysphagia - choking - SOB with lying down  She has + FH of thyroid disorders in: mother, PGM. No FH of thyroid cancer. No h/o radiation tx to head or neck.  No herbal supplements. No recent steroids use.   ROS: Constitutional: no weight gain/+ weight loss, no fatigue, no subjective hyperthermia, no subjective hypothermia Eyes: no blurry vision, no xerophthalmia ENT: no sore throat, + see HPI Cardiovascular: no CP/no SOB/no palpitations/no leg swelling Respiratory: no cough/no SOB/no wheezing Gastrointestinal: no N/no V/no D/no C/no acid reflux Musculoskeletal: no  muscle aches/no joint aches Skin: no rashes, no hair loss Neurological: no  tremors/no numbness/no tingling/no dizziness  I reviewed pt's medications, allergies, PMH, social hx, family hx, and changes were documented in the history of present illness. Otherwise, unchanged from my initial visit note.  Past Medical History:  Diagnosis Date  . GERD (gastroesophageal reflux disease)    resolved   . PONV (postoperative nausea and vomiting)    mild nausea responsive to meds   . Pulmonary embolism ([place]) 10/2017  . Thyroid carcinoma [facility])    Past Surgical History:  Procedure Laterality Date  . broken wrist    . KNEE SURGERY     arthroscopy   . THYROIDECTOMY Right 07/24/2018   Procedure: RIGHT THYROID LOBECTOMY  AND ISTHMUS;  Surgeon: Armandina Gemma, MD;  Location: [facility];  Service: General;  Laterality: Right;   Social History   Socioeconomic History  . Marital status: Single    Spouse name: Not on file  . Number of children: Not on file  . Years of education: Not on file  . Highest education level: Not on file  Occupational History  . Not on file  Social Needs  . Financial resource strain: Not on file  . Food insecurity    Worry: Not on file    Inability: Not on file  . Transportation needs    Medical: Not on file    Non-medical: Not on file  Tobacco Use  . Smoking status: Never Smoker  . Smokeless tobacco: Never Used  Substance and Sexual Activity  . Alcohol use: Yes    Comment: occasional  . Drug use: Never  . Sexual activity: Yes    Birth control/protection: I.U.D.    Comment: Mirena placed 11/04/18  Lifestyle  . Physical activity    Days per week: Not on file    Minutes per session: Not on file  . Stress: Not on file  Relationships  . Social Herbalist on phone: Not on file    Gets together: Not on file    Attends religious service: Not on file    Active member of club or organization: Not on file    Attends meetings of clubs or organizations: Not on file    Relationship status: Not on file  . Intimate partner violence    Fear  of current or ex partner: Not on file    Emotionally abused: Not on file    Physically abused: Not on file    Forced sexual activity: Not on file  Other Topics Concern  . Not on file  Social History Narrative  . Not on file   Current Outpatient Medications on File Prior to Visit  Medication Sig Dispense Refill  . apixaban (ELIQUIS) 5 MG TABS tablet Take 1 tablet (5 mg total) by mouth 2 (two) times daily. 60 tablet 3  . levonorgestrel (MIRENA) 20 MCG/24HR IUD 1 each by Intrauterine route once. Placed 11/14/18    . levothyroxine (SYNTHROID) 75 MCG tablet TAKE 1 TABLET BY MOUTH DAILY BEFORE BREAKFAST 90 tablet 0   No current facility-administered medications on file prior to visit.    No Known Allergies Family History  Problem Relation Age of Onset  . Breast cancer Maternal Aunt   . Heart disease Paternal Grandmother   . Heart disease Paternal Grandfather     PE: BP 128/80   Pulse 90   Ht 5\' 8"  (1.727 m)   Wt 287 lb (130.2 kg)   LMP 04/03/2019 (  Exact Date)   SpO2 99%   BMI 43.64 kg/m  Wt Readings from Last 3 Encounters:  04/20/19 287 lb (130.2 kg)  04/07/19 282 lb (127.9 kg)  12/16/18 294 lb (133.4 kg)   Constitutional: overweight, in NAD Eyes: PERRLA, EOMI, no exophthalmos ENT: moist mucous membranes, no neck masses, thyroidectomy scar healed  - inconspicuous, no cervical lymphadenopathy Cardiovascular: RRR, No MRG Respiratory: CTA B Gastrointestinal: abdomen soft, NT, ND, BS+ Musculoskeletal: no deformities, strength intact in all 4 Skin: moist, warm, no rashes Neurological: no tremor with outstretched hands, DTR normal in all 4  ASSESSMENT: 1. Thyroid cancer - see HPI  2. Postsurgical Hypothyroidism  PLAN:  1. Thyroid cancer -papillary -Patient with thyroid cancer stage I TNM per review of pathology. -We again discussed that this is a slow-growing cancer with good prognosis and her life expectancy is unlikely to be reduced due to cancer -The tumor was larger  than 1.5 cm, however, it did not have any other aggressive features, so I did not recommend completion thyroidectomy or RAI treatment.  We discussed that we will continue to follow her with a neck ultrasound in a year from her surgery and then thyroglobulin levels yearly and neck ultrasounds as needed -We will check thyroid ultrasound in 07/2019 -We again discussed that the absolute value for thyroglobulin levels is not important, but we need to follow the trends.  We reviewed her levels from 11/2018.  Her ATA antibodies are not detectable, but thyroglobulin level is detectable, as expected. -we will check thyroglobulin and ATA at next visit  2. Patient with history of total thyroidectomy for cancer, now with iatrogenic hypothyroidism, on levothyroxine therapy.  She had initially increased fatigue after her surgery but this coincided with a diagnosis of mononucleosis.  She has been treated for this. - latest thyroid labs reviewed with pt >> normal 11/2018 - she continues on LT4 75 mcg daily - pt feels good on this dose. - we discussed about taking the thyroid hormone every day, with water, >30 minutes before breakfast, separated by >4 hours from acid reflux medications, calcium, iron, multivitamins. Pt. is taking it correctly. - will check thyroid tests in 10 to 14 days: TSH and fT4.  She is taking biotin high-dose and I advised her to always stop this for at least 10 days before we check thyroid labs. - If labs are abnormal, she will need to return for repeat TFTs in 1.5 months -I will see her back in 5 to 6 months  Orders Placed This Encounter  Procedures  . TSH  . T4, free   - time spent with the patient: 25 minutes, of which >50% was spent in obtaining information about her symptoms, reviewing her previous labs, evaluations, and treatments, counseling her about her conditions (please see the discussed topics above), and developing a plan to further investigate and treat them.  Philemon Kingdom, MD PhD [facility]

## 2019-04-24 ENCOUNTER — Telehealth: Payer: Self-pay | Admitting: *Deleted

## 2019-04-24 NOTE — Telephone Encounter (Signed)
Call to [facility] and requested benign hematology clinic. After several transfers was able to leave VM on nurse line requesting Dr. Gareth Morgan page Dr. Benay Spice to discuss case and referral.

## 2019-04-30 ENCOUNTER — Other Ambulatory Visit: Payer: Self-pay

## 2019-04-30 ENCOUNTER — Other Ambulatory Visit: Payer: BC Managed Care – PPO | Admitting: Internal Medicine

## 2019-04-30 DIAGNOSIS — Z7901 Long term (current) use of anticoagulants: Secondary | ICD-10-CM

## 2019-04-30 DIAGNOSIS — Z86711 Personal history of pulmonary embolism: Secondary | ICD-10-CM

## 2019-04-30 DIAGNOSIS — Z85850 Personal history of malignant neoplasm of thyroid: Secondary | ICD-10-CM

## 2019-04-30 DIAGNOSIS — E559 Vitamin D deficiency, unspecified: Secondary | ICD-10-CM | POA: Diagnosis not present

## 2019-04-30 DIAGNOSIS — E785 Hyperlipidemia, unspecified: Secondary | ICD-10-CM | POA: Diagnosis not present

## 2019-04-30 DIAGNOSIS — E890 Postprocedural hypothyroidism: Secondary | ICD-10-CM | POA: Diagnosis not present

## 2019-04-30 DIAGNOSIS — Z1329 Encounter for screening for other suspected endocrine disorder: Secondary | ICD-10-CM

## 2019-04-30 DIAGNOSIS — Z0000 Encounter for general adult medical examination without abnormal findings: Secondary | ICD-10-CM

## 2019-05-01 ENCOUNTER — Other Ambulatory Visit: Payer: Self-pay

## 2019-05-01 ENCOUNTER — Encounter: Payer: Self-pay | Admitting: Internal Medicine

## 2019-05-01 LAB — HEMOGLOBIN A1C
Hgb A1c MFr Bld: 5.1 % of total Hgb (ref <5.7)
Mean Plasma Glucose: 100 (calc)
eAG (mmol/L): 5.5 (calc)

## 2019-05-01 LAB — CBC WITH DIFFERENTIAL/PLATELET
Absolute Monocytes: 348 cells/uL (ref 200–950)
Basophils Absolute: 49 cells/uL (ref 0–200)
Basophils Relative: 1 %
Eosinophils Absolute: 250 cells/uL (ref 15–500)
Eosinophils Relative: 5.1 %
HCT: 45.2 % — ABNORMAL HIGH (ref 35.0–45.0)
Hemoglobin: 15.6 g/dL — ABNORMAL HIGH (ref 11.7–15.5)
Lymphs Abs: 1387 cells/uL (ref 850–3900)
MCH: 32.6 pg (ref 27.0–33.0)
MCHC: 34.5 g/dL (ref 32.0–36.0)
MCV: 94.4 fL (ref 80.0–100.0)
MPV: 11.5 fL (ref 7.5–12.5)
Monocytes Relative: 7.1 %
Neutro Abs: 2867 cells/uL (ref 1500–7800)
Neutrophils Relative %: 58.5 %
Platelets: 254 10*3/uL (ref 140–400)
RBC: 4.79 10*6/uL (ref 3.80–5.10)
RDW: 12.2 % (ref 11.0–15.0)
Total Lymphocyte: 28.3 %
WBC: 4.9 10*3/uL (ref 3.8–10.8)

## 2019-05-01 LAB — COMPLETE METABOLIC PANEL WITH GFR
AG Ratio: 1.6 (calc) (ref 1.0–2.5)
ALT: 9 U/L (ref 6–29)
AST: 11 U/L (ref 10–30)
Albumin: 3.9 g/dL (ref 3.6–5.1)
Alkaline phosphatase (APISO): 57 U/L (ref 31–125)
BUN: 16 mg/dL (ref 7–25)
CO2: 21 mmol/L (ref 20–32)
Calcium: 9 mg/dL (ref 8.6–10.2)
Chloride: 104 mmol/L (ref 98–110)
Creat: 0.85 mg/dL (ref 0.50–1.10)
GFR, Est African American: 99 mL/min/{1.73_m2} (ref >=60)
GFR, Est Non African American: 86 mL/min/{1.73_m2} (ref >=60)
Globulin: 2.5 g/dL (calc) (ref 1.9–3.7)
Glucose, Bld: 97 mg/dL (ref 65–99)
Potassium: 4.8 mmol/L (ref 3.5–5.3)
Sodium: 136 mmol/L (ref 135–146)
Total Bilirubin: 0.6 mg/dL (ref 0.2–1.2)
Total Protein: 6.4 g/dL (ref 6.1–8.1)

## 2019-05-01 LAB — LIPID PANEL
Cholesterol: 200 mg/dL — ABNORMAL HIGH (ref <200)
HDL: 38 mg/dL — ABNORMAL LOW (ref >=50)
LDL Cholesterol (Calc): 146 mg/dL (calc) — ABNORMAL HIGH
Non-HDL Cholesterol (Calc): 162 mg/dL (calc) — ABNORMAL HIGH (ref <130)
Total CHOL/HDL Ratio: 5.3 (calc) — ABNORMAL HIGH (ref <5.0)
Triglycerides: 61 mg/dL (ref <150)

## 2019-05-01 LAB — VITAMIN D 25 HYDROXY (VIT D DEFICIENCY, FRACTURES): Vit D, 25-Hydroxy: 23 ng/mL — ABNORMAL LOW (ref 30–100)

## 2019-05-01 LAB — TSH: TSH: 1.73 mIU/L

## 2019-05-01 MED ORDER — ERGOCALCIFEROL 1.25 MG (50000 UT) PO CAPS: 50000.0000 [IU] | ORAL_CAPSULE | ORAL | 0 refills | Status: DC

## 2019-05-01 MED ORDER — ROSUVASTATIN CALCIUM 5 MG PO TABS: ORAL_TABLET | ORAL | 0 refills | Status: DC

## 2019-05-27 ENCOUNTER — Other Ambulatory Visit: Payer: Self-pay | Admitting: Oncology

## 2019-05-27 ENCOUNTER — Other Ambulatory Visit: Payer: Self-pay | Admitting: Internal Medicine

## 2019-06-09 DIAGNOSIS — I2692 Saddle embolus of pulmonary artery without acute cor pulmonale: Secondary | ICD-10-CM | POA: Diagnosis not present

## 2019-06-25 ENCOUNTER — Other Ambulatory Visit: Payer: Self-pay | Admitting: Internal Medicine

## 2019-06-25 DIAGNOSIS — Z1231 Encounter for screening mammogram for malignant neoplasm of breast: Secondary | ICD-10-CM

## 2019-06-30 ENCOUNTER — Ambulatory Visit
Admission: RE | Admit: 2019-06-30 | Discharge: 2019-06-30 | Disposition: A | Discharge status: Home / Self Care | Payer: BC Managed Care – PPO | Source: Ambulatory Visit

## 2019-06-30 ENCOUNTER — Other Ambulatory Visit: Payer: Self-pay

## 2019-06-30 DIAGNOSIS — Z1231 Encounter for screening mammogram for malignant neoplasm of breast: Secondary | ICD-10-CM | POA: Diagnosis not present

## 2019-07-30 ENCOUNTER — Other Ambulatory Visit: Payer: Self-pay | Admitting: Internal Medicine

## 2019-08-17 ENCOUNTER — Other Ambulatory Visit: Payer: BC Managed Care – PPO | Admitting: Internal Medicine

## 2019-08-17 ENCOUNTER — Other Ambulatory Visit: Payer: Self-pay

## 2019-08-17 DIAGNOSIS — R7302 Impaired glucose tolerance (oral): Secondary | ICD-10-CM

## 2019-08-17 DIAGNOSIS — E559 Vitamin D deficiency, unspecified: Secondary | ICD-10-CM | POA: Diagnosis not present

## 2019-08-17 DIAGNOSIS — E785 Hyperlipidemia, unspecified: Secondary | ICD-10-CM

## 2019-08-18 ENCOUNTER — Ambulatory Visit (INDEPENDENT_AMBULATORY_CARE_PROVIDER_SITE_OTHER): Payer: BC Managed Care – PPO | Admitting: Internal Medicine

## 2019-08-18 ENCOUNTER — Encounter: Payer: Self-pay | Admitting: Internal Medicine

## 2019-08-18 VITALS — BP 120/70 | HR 80 | Temp 98.0°F | Ht 68.0 in | Wt 264.0 lb

## 2019-08-18 DIAGNOSIS — Z85850 Personal history of malignant neoplasm of thyroid: Secondary | ICD-10-CM

## 2019-08-18 DIAGNOSIS — E559 Vitamin D deficiency, unspecified: Secondary | ICD-10-CM

## 2019-08-18 DIAGNOSIS — Z6841 Body Mass Index (BMI) 40.0 and over, adult: Secondary | ICD-10-CM

## 2019-08-18 DIAGNOSIS — Z7901 Long term (current) use of anticoagulants: Secondary | ICD-10-CM | POA: Diagnosis not present

## 2019-08-18 DIAGNOSIS — E7800 Pure hypercholesterolemia, unspecified: Secondary | ICD-10-CM | POA: Diagnosis not present

## 2019-08-18 LAB — HEPATIC FUNCTION PANEL
AG Ratio: 1.7 (calc) (ref 1.0–2.5)
ALT: 10 U/L (ref 6–29)
AST: 13 U/L (ref 10–30)
Albumin: 4 g/dL (ref 3.6–5.1)
Alkaline phosphatase (APISO): 53 U/L (ref 31–125)
Bilirubin, Direct: 0.1 mg/dL (ref 0.0–0.2)
Globulin: 2.3 g/dL (calc) (ref 1.9–3.7)
Indirect Bilirubin: 0.5 mg/dL (calc) (ref 0.2–1.2)
Total Bilirubin: 0.6 mg/dL (ref 0.2–1.2)
Total Protein: 6.3 g/dL (ref 6.1–8.1)

## 2019-08-18 LAB — HEMOGLOBIN A1C
Hgb A1c MFr Bld: 5 % of total Hgb (ref <5.7)
Mean Plasma Glucose: 97 (calc)
eAG (mmol/L): 5.4 (calc)

## 2019-08-18 LAB — LIPID PANEL
Cholesterol: 181 mg/dL (ref <200)
HDL: 47 mg/dL — ABNORMAL LOW (ref >=50)
LDL Cholesterol (Calc): 119 mg/dL (calc) — ABNORMAL HIGH
Non-HDL Cholesterol (Calc): 134 mg/dL (calc) — ABNORMAL HIGH (ref <130)
Total CHOL/HDL Ratio: 3.9 (calc) (ref <5.0)
Triglycerides: 62 mg/dL (ref <150)

## 2019-08-18 NOTE — Progress Notes (Signed)
   Subjective:    Patient ID: Monico Blitz, MD, female    DOB: 07-Feb-1981, 39 y.o.   MRN: [id number]  HPI 39 year old Female started on Crestor 5 mg 3 times a week in November for elevated LDL cholesterol of 146.  At that time her total cholesterol was 200, HDL 38 and triglycerides 61. Has been losing weight with keto diet.  In December 2020 she saw Dr. Joan Flores, Hematologist, at [facility] regarding her anticoagulation situation.  He recommended that she continue with long-term anticoagulation with reevaluation once yearly.  He recommended that she continue with 5 mg twice daily.  Hemoglobin A1c in November was normal as was TSH.  However she was vitamin D deficient at that time with level of 23.  Was placed on high-dose vitamin D weekly  She is also on levothyroxine 75 mcg daily per Dr. Letta Median with history of papillary thyroid cancer status right thyroidectomy and isthmusectomy by Dr. Harlow Asa 07/24/2018.   In 2019, she had CT angiogram for investigation of PE which was positive and was noted to have a 1.5 cm low-attenuation nodule within the anterior aspect of the right thyroid gland with some peripheral dystrophic calcifications.  She has an IUD for contraception  Review of Systems no issues with Crestor.  Feels well.     Objective:   Physical Exam Blood pressure 120/70 pulse 80 temperature 98 degrees orally pulse oximetry 98% weight 264 pounds BMI 40.14.  In October, she weighed 282 pounds. Diet is working well for her.  Hemoglobin A1c 5%.  LDL has improved on Crestor from 1 46-1 19.  Total cholesterol has improved from 200-1 81.  HDL has increased from 38-47.  Triglycerides are normal.     Assessment & Plan:  Chronic anticoagulation due to history of PE/saddle embolus.  Hematologist at [facility], Dr. Joan Flores says she should stay on Eliquis indefinitely  History of papillary thyroid carcinoma followed by Dr. Maryellen Pile and is on thyroid replacement medication  Obesity-is doing  well with keto diet.  BMI 40.14  Hyperlipidemia-pure hypercholesterolemia-doing well on Crestor 5 mg 3 times weekly  History of vitamin D deficiency-level was 23 in November.  Patient has been placed on high-dose vitamin D weekly.

## 2019-08-18 NOTE — Patient Instructions (Addendum)
It was a pleasure to see you today. Let me know when you would like to start HPV series.  Follow-up in 6 months with health maintenance exam and fasting labs.

## 2019-08-25 ENCOUNTER — Other Ambulatory Visit: Payer: Self-pay | Admitting: Oncology

## 2019-08-26 ENCOUNTER — Telehealth: Payer: Self-pay | Admitting: Oncology

## 2019-08-26 NOTE — Telephone Encounter (Signed)
Scheduled appt per 3/10 sch message - unable to reach pt . vmail full - mailed letter with appt date and time

## 2019-08-31 ENCOUNTER — Other Ambulatory Visit: Payer: Self-pay | Admitting: Oncology

## 2019-08-31 ENCOUNTER — Other Ambulatory Visit: Payer: Self-pay | Admitting: Internal Medicine

## 2019-09-28 ENCOUNTER — Other Ambulatory Visit: Payer: Self-pay | Admitting: Internal Medicine

## 2019-10-08 ENCOUNTER — Other Ambulatory Visit: Payer: Self-pay | Admitting: Internal Medicine

## 2019-10-20 ENCOUNTER — Ambulatory Visit (INDEPENDENT_AMBULATORY_CARE_PROVIDER_SITE_OTHER): Payer: BC Managed Care – PPO | Admitting: Internal Medicine

## 2019-10-20 ENCOUNTER — Encounter: Payer: Self-pay | Admitting: Internal Medicine

## 2019-10-20 ENCOUNTER — Other Ambulatory Visit: Payer: Self-pay

## 2019-10-20 VITALS — BP 130/80 | HR 92 | Ht 68.0 in | Wt 290.0 lb

## 2019-10-20 DIAGNOSIS — C73 Malignant neoplasm of thyroid gland: Secondary | ICD-10-CM

## 2019-10-20 DIAGNOSIS — E890 Postprocedural hypothyroidism: Secondary | ICD-10-CM

## 2019-10-20 LAB — TSH: TSH: 1.85 u[IU]/mL (ref 0.35–4.50)

## 2019-10-20 NOTE — Progress Notes (Addendum)
Patient ID: Penny Blitz, MD, female   DOB: [birth year], 39 y.o.   MRN: [id number]   This visit occurred during the SARS-CoV-2 public health emergency.  Safety protocols were in place, including screening questions prior to the visit, additional usage of staff PPE, and extensive cleaning of exam room while observing appropriate contact time as indicated for disinfecting solutions.   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 Harlow Asa, returning for follow-up for thyroid cancer and postsurgical hypothyroidism.  Last visit 6 months ago.  Reviewed history: Pt. has been dx with papillary thyroid cancer in 07/2018.   Reviewed history: Patient was found to have a right thyroid nodule in [employer] (>1 cm).  FNA (2003): colloid  Chest CT angiogram (11/18/2017) performed during investigation for PE (which was positive): Approximately 1.5 cm low-attenuation nodule within the inferior aspect of the right thyroid gland with peripheral dystrophic calcifications. No evidence of mediastinal or hilar adenopathy.  She then had a thyroid ultrasound (04/09/2018): Location: Right; Inferior Maximum size: 1.8 x 1.7 x 1.5 cm Composition: solid/almost completely solid (2) Echogenicity: isoechoic (1) Echogenic foci: peripheral calcifications (2)  FNA of this nodule (05/08/2018): THYROID, FINE NEEDLE ASPIRATION, RLP (SPECIMEN 1 OF 1, COLLECTED 05/08/18): SUSPICIOUS FOR MALIGNANCY, SEE COMMENT (BETHESDA CATEGORY V).  She had right lobectomy + isthmusectomy (07/24/2018) by Dr. Harlow Asa: Thyroid, lobectomy, right and isthmus - PAPILLARY CARCINOMA, 1.8 CM IN GREATEST DIMENSION. - NO EXTRATHYROIDAL EXTENSION IDENTIFIED. - MARGINS OF RESECTION ARE NOT INVOLVED. - ONE LYMPH NODE, NEGATIVE FOR CARCINOMA (0/1). - SEE ONCOLOGY TABLE. Microscopic Comment THYROID GLAND: Procedure: Right lobectomy and isthmusectomy Tumor Focality: Unifocal Tumor Site: Right  lobe Tumor Size: Greatest dimension: 1.8 cm Histologic Type: Papillary carcinoma, classic (usual, conventional) Margins: Uninvolved by carcinoma Angioinvasion: Not identified Lymphatic Invasion: Not identified Extrathyroidal Extension: Not identified Regional Lymph Nodes: Number of Lymph Nodes Involved: 0 Nodal Levels Involved: Not applicable Size of Largest Metastatic Deposit: Not applicable Extranodal Extension (ENE): Not applicable Number of Lymph Nodes Examined: 1 Nodal Levels Examined: Incidental. The lymph node is present in the soft tissue surrounding the thyroid parenchyma. Pathologic Stage Classification (pTNM, AJCC 8th Edition): pT1b, pN0 Comment: Intradepartmental consultation was obtained (Dr. Tresa Moore). ross and Lab Results  Component Value Date   THYROGLB 17.7 11/18/2018   Lab Results  Component Value Date   THGAB 1 11/18/2018  cal Information Postsurgical hypothyroidism  Pt is on levothyroxine 75 mcg daily, taken: - in am - fasting - at least 30-60 min from coffee and cream, breakfast - no Ca, Fe, MVI, PPIs - On biotin 5000 mcg daily - last dose 10 days ago  Reviewed her TFTs: Lab Results  Component Value Date   TSH 1.73 04/30/2019   TSH 1.55 11/18/2018   TSH 2.026 11/19/2017   FREET4 1.20 11/18/2018  09/2018: TSH ~1 08/2018: TSH ~4  Pt denies: - feeling nodules in neck - hoarseness - dysphagia - choking - SOB with lying down  She has + FH of thyroid disorders in: mother, PGM. No FH of thyroid cancer. No h/o radiation tx to head or neck.  No herbal supplements. No Biotin use. No recent steroids use.   ROS: Constitutional: + weight gain/+ weight loss (keto diet), no fatigue, no subjective hyperthermia, no subjective hypothermia Eyes: no blurry vision, no xerophthalmia ENT: no sore throat, + see HPI Cardiovascular: no CP/no SOB/no palpitations/no leg swelling Respiratory: no cough/no SOB/no wheezing Gastrointestinal: no N/no V/no D/no C/no acid  reflux  Musculoskeletal: no muscle aches/no joint aches Skin: no rashes, no hair loss Neurological: no tremors/no numbness/no tingling/no dizziness  I reviewed pt's medications, allergies, PMH, social hx, family hx, and changes were documented in the history of present illness. Otherwise, unchanged from my initial visit note.  Past Medical History:  Diagnosis Date  . GERD (gastroesophageal reflux disease)    resolved   . PONV (postoperative nausea and vomiting)    mild nausea responsive to meds   . Pulmonary embolism ([place]) 10/2017  . Thyroid carcinoma [facility])    Past Surgical History:  Procedure Laterality Date  . broken wrist    . KNEE SURGERY     arthroscopy   . THYROIDECTOMY Right 07/24/2018   Procedure: RIGHT THYROID LOBECTOMY  AND ISTHMUS;  Surgeon: Armandina Gemma, MD;  Location: [facility]S;  Service: General;  Laterality: Right;   Social History   Socioeconomic History  . Marital status: Single    Spouse name: Not on file  . Number of children: Not on file  . Years of education: Not on file  . Highest education level: Not on file  Occupational History  . Not on file  Tobacco Use  . Smoking status: Never Smoker  . Smokeless tobacco: Never Used  Substance and Sexual Activity  . Alcohol use: Yes    Comment: occasional  . Drug use: Never  . Sexual activity: Yes    Birth control/protection: I.U.D.    Comment: Mirena placed 11/04/18  Other Topics Concern  . Not on file  Social History Narrative  . Not on file   Social Determinants of Health   Financial Resource Strain:   . Difficulty of Paying Living Expenses:   Food Insecurity:   . Worried About Charity fundraiser in the Last Year:   . Arboriculturist in the Last Year:   Transportation Needs:   . Film/video editor (Medical):   Marland Kitchen Lack of Transportation (Non-Medical):   Physical Activity:   . Days of Exercise per Week:   . Minutes of Exercise per Session:   Stress:   . Feeling of Stress :   Social Connections:    . Frequency of Communication with Friends and Family:   . Frequency of Social Gatherings with Friends and Family:   . Attends Religious Services:   . Active Member of Clubs or Organizations:   . Attends Archivist Meetings:   Marland Kitchen Marital Status:   Intimate Partner Violence:   . Fear of Current or Ex-Partner:   . Emotionally Abused:   Marland Kitchen Physically Abused:   . Sexually Abused:    Current Outpatient Medications on File Prior to Visit  Medication Sig Dispense Refill  . ELIQUIS 5 MG TABS tablet TAKE ONE TABLET BY MOUTH TWICE A DAY 60 tablet 5  . levonorgestrel (MIRENA) 20 MCG/24HR IUD 1 each by Intrauterine route once. Placed 11/14/18    . levothyroxine (SYNTHROID) 75 MCG tablet TAKE ONE TABLET BY MOUTH DAILY BEFORE BREAKFAST 90 tablet 0  . rosuvastatin (CRESTOR) 5 MG tablet TAKE 1 TABELT BY MOUTH 3 TIMES WEEKLY 36 tablet 5  . Vitamin D, Ergocalciferol, (DRISDOL) 1.25 MG (50000 UNIT) CAPS capsule TAKE ONE CAPSULE BY MOUTH ONCE WEEKLY 12 capsule 3   No current facility-administered medications on file prior to visit.   No Known Allergies Family History  Problem Relation Age of Onset  . Breast cancer Maternal Aunt 98  . Heart disease Paternal Grandmother   . Heart disease Paternal Grandfather  PE: BP 130/80   Pulse 92   Ht 5\' 8"  (1.727 m)   Wt 290 lb (131.5 kg)   SpO2 98%   BMI 44.09 kg/m  Wt Readings from Last 3 Encounters:  10/20/19 290 lb (131.5 kg)  08/18/19 264 lb (119.7 kg)  04/20/19 287 lb (130.2 kg)   Constitutional: overweight, in NAD Eyes: PERRLA, EOMI, no exophthalmos ENT: moist mucous membranes, no thyroid masses palpated, thyroidectomy scar healed completely, no cervical lymphadenopathy Cardiovascular: RRR, No MRG Respiratory: CTA B Gastrointestinal: abdomen soft, NT, ND, BS+ Musculoskeletal: no deformities, strength intact in all 4 Skin: moist, warm, no rashes Neurological: no tremor with outstretched hands, DTR normal in all 4  ASSESSMENT: 1.  Thyroid cancer - see HPI  2. Postsurgical Hypothyroidism  PLAN:  1. Thyroid cancer -papillary -Patient with thyroid cancer stage I TNM per review of pathology -We again discussed that this is a slow-growing cancer with good prognosis -The tumor was larger than 1.5 cm, however, it did not have any other aggressive feature so I did not recommend completion thyroidectomy or RAI treatment.  We discussed about continuing to follow her with a neck ultrasound a year from the surgery.  At that time we also discussed about checking thyroglobulin levels yearly, however, new studies show that in the setting of hemithyroidectomy, the thyroglobulin levels are too fluctuating and unreliable and most likely should not be checked.   -We will repeat a thyroid ultrasound now and as needed in the future.  2. Patient with history of total thyroidectomy for cancer, now with iatrogenic hypothyroidism, on levothyroxine therapy.  She had initially increased fatigue after her surgery but this coincided with diagnosis of mononucleosis, for which she has been treated. - latest thyroid labs reviewed with pt >> normal: Lab Results  Component Value Date   TSH 1.73 04/30/2019   - she continues on LT4 75 mcg daily - pt feels good on this dose, without complaints. - we discussed about taking the thyroid hormone every day, with water, >30 minutes before breakfast, separated by >4 hours from acid reflux medications, calcium, iron, multivitamins. Pt. is taking it correctly. - will check thyroid tests today: TSH and fT4 - If labs are abnormal, she will need to return for repeat TFTs in 1.5 months  Office Visit on 10/20/2019  Component Date Value Ref Range Status  . TSH 10/20/2019 1.85  0.35 - 4.50 uIU/mL Final   TSH normal.  Apparently free T4 not checked.  Thyroid U/S (11/03/2019): Narrative & Impression    CLINICAL DATA:  Other. 39 year old female with a history of papillary thyroid cancer status post right  hemithyroidectomy.  COMPARISON:  Prior thyroid ultrasound 04/08/2018  FINDINGS: Parenchymal Echotexture: Moderately heterogenous Isthmus: 0.2 cm Right lobe: Surgically absent Left lobe: 4.3 x 1.1 x 1.6 cm ____________________________________________________________________  Surgical changes of right hemithyroidectomy. No residual or recurrent thyroid tissue or nodularity in the thyroid resection bed. The residual isthmus and left thyroid gland are heterogeneous in appearance. Small subcentimeter cysts and hypoechoic nodules noted incidentally on the left. No suspicious features. These nodules do not meet criteria for biopsy or further imaging evaluation.  IMPRESSION: 1. Surgical changes of prior right hemithyroidectomy without evidence of residual or recurrent thyroid tissue. 2. Heterogeneous residual thyroid isthmus and left thyroid gland containing small cysts and nodules which do not meet criteria for further evaluation.  The above is in keeping with the ACR TI-RADS recommendations - J Am Coll Radiol 2017;14:587-595.   Electronically Signed   By:  Jacqulynn Cadet M.D.   On: 11/03/2019 16:43     Philemon Kingdom, MD PhD [facility] Endocrinology

## 2019-10-20 NOTE — Patient Instructions (Addendum)
Please stop at the lab.  Please continue levothyroxine 75 mcg daily.  Take the thyroid hormone every day, with water, at least 30 minutes before breakfast, separated by at least 4 hours from: - acid reflux medications - calcium - iron - multivitamins  We will schedule a new neck U/S.  Please come back for a follow-up appointment in 1 year.

## 2019-11-03 ENCOUNTER — Ambulatory Visit
Admission: RE | Admit: 2019-11-03 | Discharge: 2019-11-03 | Disposition: A | Discharge status: Home / Self Care | Payer: BC Managed Care – PPO | Source: Ambulatory Visit | Attending: Internal Medicine | Admitting: Internal Medicine

## 2019-11-03 ENCOUNTER — Telehealth: Payer: Self-pay | Admitting: Internal Medicine

## 2019-11-03 DIAGNOSIS — C73 Malignant neoplasm of thyroid gland: Secondary | ICD-10-CM

## 2019-11-03 DIAGNOSIS — E042 Nontoxic multinodular goiter: Secondary | ICD-10-CM | POA: Diagnosis not present

## 2019-11-03 NOTE — Telephone Encounter (Signed)
She will need total of 3 vaccines over 3 months. I will order first HPV vaccine today. Will need nurse visit with each 1 (Gardisil)

## 2019-11-03 NOTE — Telephone Encounter (Signed)
Pt said she was told to call when she was ready to get HPV vaccine and she said she is ready and wanted to see if Dr Renold Genta can order it

## 2019-11-03 NOTE — Telephone Encounter (Signed)
Patient aware.

## 2019-11-10 ENCOUNTER — Ambulatory Visit: Payer: BC Managed Care – PPO | Admitting: Internal Medicine

## 2019-11-17 ENCOUNTER — Encounter: Payer: Self-pay | Admitting: Internal Medicine

## 2019-11-17 ENCOUNTER — Other Ambulatory Visit: Payer: Self-pay

## 2019-11-17 ENCOUNTER — Ambulatory Visit (INDEPENDENT_AMBULATORY_CARE_PROVIDER_SITE_OTHER): Payer: BC Managed Care – PPO | Admitting: Internal Medicine

## 2019-11-17 VITALS — BP 110/80 | HR 70 | Temp 98.7°F | Ht 68.0 in | Wt 290.0 lb

## 2019-11-17 DIAGNOSIS — Z23 Encounter for immunization: Secondary | ICD-10-CM | POA: Diagnosis not present

## 2019-11-17 NOTE — Patient Instructions (Signed)
HPV vaccine #1 given. Next dose due in one month.

## 2019-11-17 NOTE — Progress Notes (Signed)
HPV vaccine #1 given Next dose to be given in one month

## 2019-12-23 DIAGNOSIS — B029 Zoster without complications: Secondary | ICD-10-CM | POA: Diagnosis not present

## 2019-12-25 DIAGNOSIS — H1032 Unspecified acute conjunctivitis, left eye: Secondary | ICD-10-CM | POA: Diagnosis not present

## 2019-12-25 DIAGNOSIS — B029 Zoster without complications: Secondary | ICD-10-CM | POA: Diagnosis not present

## 2019-12-29 DIAGNOSIS — B029 Zoster without complications: Secondary | ICD-10-CM | POA: Diagnosis not present

## 2020-02-11 ENCOUNTER — Other Ambulatory Visit: Payer: Self-pay | Admitting: *Deleted

## 2020-02-11 MED ORDER — APIXABAN 5 MG PO TABS: 5.0000 mg | ORAL_TABLET | Freq: Two times a day (BID) | ORAL | 3 refills | Status: DC

## 2020-02-22 DIAGNOSIS — B0239 Other herpes zoster eye disease: Secondary | ICD-10-CM | POA: Insufficient documentation

## 2020-02-23 ENCOUNTER — Ambulatory Visit (INDEPENDENT_AMBULATORY_CARE_PROVIDER_SITE_OTHER): Payer: BC Managed Care – PPO | Admitting: Internal Medicine

## 2020-02-23 ENCOUNTER — Other Ambulatory Visit: Payer: Self-pay

## 2020-02-23 ENCOUNTER — Encounter: Payer: Self-pay | Admitting: Internal Medicine

## 2020-02-23 VITALS — BP 130/88 | HR 70 | Ht 68.0 in | Wt 290.0 lb

## 2020-02-23 DIAGNOSIS — Z23 Encounter for immunization: Secondary | ICD-10-CM

## 2020-02-23 NOTE — Progress Notes (Signed)
Third HPV vaccine given by CMA. Patient has now completed the series. MJB.MD

## 2020-03-28 ENCOUNTER — Other Ambulatory Visit: Payer: Self-pay

## 2020-03-28 ENCOUNTER — Inpatient Hospital Stay: Payer: BC Managed Care – PPO | Attending: Oncology | Admitting: Oncology

## 2020-03-28 VITALS — BP 136/64 | HR 68 | Temp 99.2°F | Resp 18 | Ht 68.0 in | Wt 298.5 lb

## 2020-03-28 DIAGNOSIS — Z86711 Personal history of pulmonary embolism: Secondary | ICD-10-CM

## 2020-03-28 DIAGNOSIS — Z8616 Personal history of COVID-19: Secondary | ICD-10-CM | POA: Diagnosis not present

## 2020-03-28 DIAGNOSIS — E890 Postprocedural hypothyroidism: Secondary | ICD-10-CM | POA: Insufficient documentation

## 2020-03-28 DIAGNOSIS — E785 Hyperlipidemia, unspecified: Secondary | ICD-10-CM | POA: Diagnosis not present

## 2020-03-28 DIAGNOSIS — Z85850 Personal history of malignant neoplasm of thyroid: Secondary | ICD-10-CM | POA: Diagnosis not present

## 2020-03-28 DIAGNOSIS — Z7901 Long term (current) use of anticoagulants: Secondary | ICD-10-CM | POA: Insufficient documentation

## 2020-03-28 DIAGNOSIS — Z975 Presence of (intrauterine) contraceptive device: Secondary | ICD-10-CM | POA: Insufficient documentation

## 2020-03-28 DIAGNOSIS — Z79899 Other long term (current) drug therapy: Secondary | ICD-10-CM | POA: Diagnosis not present

## 2020-03-28 DIAGNOSIS — Z23 Encounter for immunization: Secondary | ICD-10-CM | POA: Insufficient documentation

## 2020-03-28 MED ORDER — INFLUENZA VAC SPLIT QUAD 0.5 ML IM SUSY
0.5000 mL | PREFILLED_SYRINGE | Freq: Once | INTRAMUSCULAR | Status: AC
  Administered 2020-03-28: 0.5 mL via INTRAMUSCULAR

## 2020-03-28 MED ORDER — INFLUENZA VAC SPLIT QUAD 0.5 ML IM SUSY
PREFILLED_SYRINGE | INTRAMUSCULAR | Status: AC
  Filled 2020-03-28: qty 0.5

## 2020-03-28 NOTE — Progress Notes (Addendum)
[place] OFFICE PROGRESS NOTE   Diagnosis: Pulmonary embolism, anticoagulation therapy  INTERVAL HISTORY:   Penny Hancock was diagnosed with a pulmonary embolism in June 2019.  She was treated for "pneumonia "for a febrile illness over the 2 weeks prior to hospital admission.  She had taken a trip with a prolonged flight to [place] in April.  She experienced a fall with knee trauma during the trip.  She was seen in consultation by Dr. Beryle Beams and placed on anticoagulation therapy with Lovenox followed by apixaban.  She had a negative hypercoagulation panel.  Penny Hancock reports resolution of respiratory symptoms after a few weeks of anticoagulation therapy.  She continues apixaban.  No bleeding.  No complaint.  She uses a Mirena device for treatment of menorrhagia.  She was seen in consultation by Dr. Joan Flores in December of last year.  He recommends indefinite anticoagulation therapy.  Penny Hancock reports pleuritic chest pain since she was a teenager.  This has resolved since taking Eliquis.  She was diagnosed with thyroid cancer and underwent a partial thyroidectomy in February 2020.  She is followed by Dr. Renne Crigler for management of thyroid hormone replacement and surveillance.  Objective:  Vital signs in last 24 hours:  Blood pressure 136/64, pulse 68, temperature 99.2 F (37.3 C), temperature source Tympanic, resp. rate 18, height 5\' 8"  (1.727 m), weight 298 lb 8 oz (135.4 kg), SpO2 100 %.     Resp: Lungs clear bilaterally Cardio: Regular rate and rhythm GI: No hepatosplenomegaly Vascular: No leg edema   Lab Results:  Lab Results  Component Value Date   WBC 4.9 04/30/2019   HGB 15.6 (H) 04/30/2019   HCT 45.2 (H) 04/30/2019   MCV 94.4 04/30/2019   PLT 254 04/30/2019   NEUTROABS 2,867 04/30/2019    CMP  Lab Results  Component Value Date   NA 136 04/30/2019   K 4.8 04/30/2019   CL 104 04/30/2019   CO2 21 04/30/2019   GLUCOSE 97 04/30/2019   BUN 16  04/30/2019   CREATININE 0.85 04/30/2019   CALCIUM 9.0 04/30/2019   PROT 6.3 08/17/2019   ALBUMIN 3.8 11/18/2017   AST 13 08/17/2019   ALT 10 08/17/2019   ALKPHOS 76 11/18/2017   BILITOT 0.6 08/17/2019   GFRNONAA 86 04/30/2019   GFRAA 99 04/30/2019    Medications: I have reviewed the patient's current medications.   Assessment/Plan: 1. Acute bilateral pulmonary emboli November 18, 2017-maintained on indefinite apixaban anticoagulation  Negative hypercoagulation panel 11/18/2017  Elevated D-dimer April 2020 while off of apixaban for 1 month 2.  Papillary thyroid cancer, status post a right thyroidectomy 07/24/2018, 1.8 cm, no extrathyroidal extension, negative resection margins, 1 - lymph node 3.  Postsurgical hypothyroidism 4.  COVID-19 infection February 2021 5.  Zoster April 2021-eye involvement 6.  History of menorrhagia-maintained on Mirena IUD 7.  Hyperlipidemia 8.  Elevated BMI    Disposition: Penny Hancock is maintained on apixaban anticoagulation after being diagnosed with acute pulmonary embolism in June 2019.  She is tolerating the apixaban well.  We discussed the indication for continuing long-term anticoagulation.  Potential risk factors for pulmonary embolism in her case include travel, leg trauma, and an elevated BMI prior to the pulmonary embolism in June 2019.  A D-dimer in April 2020 returned elevated while off of apixaban.  She continues to have an elevated BMI.  Dr. Joan Flores recommends indefinite anticoagulation therapy.  I agree.  She will continue full dose anticoagulation therapy for now.  We will consider reduced intensity anticoagulation in the future.  Penny Hancock would like to continue follow-up in the hematology clinic.  She will return for an office visit in 1 year.  She received an influenza vaccine today.  Betsy Coder, MD  03/28/2020  3:36 PM

## 2020-03-29 ENCOUNTER — Telehealth: Payer: Self-pay | Admitting: Oncology

## 2020-03-29 NOTE — Telephone Encounter (Signed)
Scheduled appointment per 10/11 los. Called patient, no answer. Left message for patient with appointment date and time.

## 2020-04-01 ENCOUNTER — Other Ambulatory Visit: Payer: Self-pay | Admitting: Internal Medicine

## 2020-04-26 ENCOUNTER — Encounter: Payer: Self-pay | Admitting: Internal Medicine

## 2020-04-26 ENCOUNTER — Ambulatory Visit (INDEPENDENT_AMBULATORY_CARE_PROVIDER_SITE_OTHER): Payer: BC Managed Care – PPO | Admitting: Internal Medicine

## 2020-04-26 ENCOUNTER — Other Ambulatory Visit: Payer: Self-pay

## 2020-04-26 VITALS — BP 120/80 | HR 86 | Temp 98.1°F | Ht 68.0 in | Wt 298.0 lb

## 2020-04-26 DIAGNOSIS — Z23 Encounter for immunization: Secondary | ICD-10-CM | POA: Diagnosis not present

## 2020-04-26 NOTE — Patient Instructions (Addendum)
Patient received third HPV vaccine L deltoid.

## 2020-04-26 NOTE — Progress Notes (Signed)
Third dose HPV vaccine given today. Series completed. MJB,MD

## 2020-06-05 ENCOUNTER — Other Ambulatory Visit: Payer: Self-pay | Admitting: Internal Medicine

## 2020-07-06 ENCOUNTER — Other Ambulatory Visit: Payer: Self-pay | Admitting: Oncology

## 2020-07-07 ENCOUNTER — Other Ambulatory Visit: Payer: Self-pay

## 2020-07-07 MED ORDER — APIXABAN 5 MG PO TABS: 5.0000 mg | ORAL_TABLET | Freq: Two times a day (BID) | ORAL | 3 refills | Status: DC

## 2020-07-08 ENCOUNTER — Other Ambulatory Visit: Payer: Self-pay

## 2020-10-25 ENCOUNTER — Ambulatory Visit (INDEPENDENT_AMBULATORY_CARE_PROVIDER_SITE_OTHER): Payer: BC Managed Care – PPO | Admitting: Internal Medicine

## 2020-10-25 ENCOUNTER — Other Ambulatory Visit: Payer: Self-pay

## 2020-10-25 ENCOUNTER — Encounter: Payer: Self-pay | Admitting: Internal Medicine

## 2020-10-25 VITALS — BP 128/82 | HR 82 | Ht 68.0 in | Wt 297.2 lb

## 2020-10-25 DIAGNOSIS — E890 Postprocedural hypothyroidism: Secondary | ICD-10-CM | POA: Diagnosis not present

## 2020-10-25 DIAGNOSIS — C73 Malignant neoplasm of thyroid gland: Secondary | ICD-10-CM

## 2020-10-25 LAB — TSH: TSH: 1.45 u[IU]/mL (ref 0.35–4.50)

## 2020-10-25 LAB — T4, FREE: Free T4: 1.17 ng/dL (ref 0.60–1.60)

## 2020-10-25 NOTE — Progress Notes (Signed)
Patient ID: Penny Blitz, MD, female   DOB: [birth year], 40 y.o.   MRN: [id number]   This visit occurred during the SARS-CoV-2 public health emergency.  Safety protocols were in place, including screening questions prior to the visit, additional usage of staff PPE, and extensive cleaning of exam room while observing appropriate contact time as indicated for disinfecting solutions.   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 Harlow Asa, returning for follow-up for thyroid cancer and postsurgical hypothyroidism.  Last visit 1 year ago.  Interim history: At last visit, she was on keto diet. She has difficulty sleeping, also has cold sweats.  On Eliquis long-term. She has monthly menses-on IUD.  Reviewed history: Pt. has been dx with papillary thyroid cancer in 07/2018.   Patient was found to have a right thyroid nodule in [employer] (>1 cm).  FNA (2003): colloid  Chest CT angiogram (11/18/2017) performed during investigation for PE (which was positive): Approximately 1.5 cm low-attenuation nodule within the inferior aspect of the right thyroid gland with peripheral dystrophic calcifications. No evidence of mediastinal or hilar adenopathy.  She then had a thyroid ultrasound (04/09/2018): Location: Right; Inferior Maximum size: 1.8 x 1.7 x 1.5 cm Composition: solid/almost completely solid (2) Echogenicity: isoechoic (1) Echogenic foci: peripheral calcifications (2)  FNA of this nodule (05/08/2018): THYROID, FINE NEEDLE ASPIRATION, RLP (SPECIMEN 1 OF 1, COLLECTED 05/08/18): SUSPICIOUS FOR MALIGNANCY, SEE COMMENT (BETHESDA CATEGORY V).  She had right lobectomy + isthmusectomy (07/24/2018) by Dr. Harlow Asa: Thyroid, lobectomy, right and isthmus - PAPILLARY CARCINOMA, 1.8 CM IN GREATEST DIMENSION. - NO EXTRATHYROIDAL EXTENSION IDENTIFIED. - MARGINS OF RESECTION ARE NOT INVOLVED. - ONE LYMPH NODE, NEGATIVE FOR CARCINOMA (0/1). - SEE ONCOLOGY  TABLE. Microscopic Comment THYROID GLAND: Procedure: Right lobectomy and isthmusectomy Tumor Focality: Unifocal Tumor Site: Right lobe Tumor Size: Greatest dimension: 1.8 cm Histologic Type: Papillary carcinoma, classic (usual, conventional) Margins: Uninvolved by carcinoma Angioinvasion: Not identified Lymphatic Invasion: Not identified Extrathyroidal Extension: Not identified Regional Lymph Nodes: Number of Lymph Nodes Involved: 0 Nodal Levels Involved: Not applicable Size of Largest Metastatic Deposit: Not applicable Extranodal Extension (ENE): Not applicable Number of Lymph Nodes Examined: 1 Nodal Levels Examined: Incidental. The lymph node is present in the soft tissue surrounding the thyroid parenchyma. Pathologic Stage Classification (pTNM, AJCC 8th Edition): pT1b, pN0 Comment: Intradepartmental consultation was obtained (Dr. Tresa Moore). r Thyroid U/S (11/03/2019): Parenchymal Echotexture: Moderately heterogenous Isthmus: 0.2 cm Right lobe: Surgically absent Left lobe: 4.3 x 1.1 x 1.6 cm ____________________________________________________________________  Surgical changes of right hemithyroidectomy. No residual or recurrent thyroid tissue or nodularity in the thyroid resection bed. The residual isthmus and left thyroid gland are heterogeneous in appearance. Small subcentimeter cysts and hypoechoic nodules noted incidentally on the left. No suspicious features. These nodules do not meet criteria for biopsy or further imaging evaluation.  IMPRESSION: 1. Surgical changes of prior right hemithyroidectomy without evidence of residual or recurrent thyroid tissue. 2. Heterogeneous residual thyroid isthmus and left thyroid gland containing small cysts and nodules which do not meet criteria for further evaluation.oss  and Lab Results  Component Value Date   THYROGLB 17.7 11/18/2018   Lab Results  Component Value Date   THGAB 1 11/18/2018  cal Information Postsurgical  hypothyroidism  Pt is on levothyroxine 75 mcg daily, taken: - in am - fasting - at least 30-60 min from coffee and cream, breakfast - no Ca, Fe, MVI, PPIs - On biotin 5000 mcg daily- last dose 2 mo  ago. On Vitamin D and occasionally Tums.  Reviewed her TFTs: Lab Results  Component Value Date   TSH 1.85 10/20/2019   TSH 1.73 04/30/2019   TSH 1.55 11/18/2018   TSH 2.026 11/19/2017   FREET4 1.20 11/18/2018  09/2018: TSH ~1 08/2018: TSH ~4  Pt denies: - feeling nodules in neck - hoarseness - dysphagia - choking - SOB with lying down  She has + FH of thyroid disorders in: mother, PGM. No FH of thyroid cancer. No h/o radiation tx to head or neck.  No herbal supplements. No Biotin use. No recent steroids use.   ROS: Constitutional: no weight gain/no weight loss, no fatigue, no subjective hyperthermia, no subjective hypothermia, + increased sweating Eyes: no blurry vision, no xerophthalmia ENT: no sore throat, + see HPI Cardiovascular: no CP/no SOB/no palpitations/no leg swelling Respiratory: no cough/no SOB/no wheezing Gastrointestinal: no N/no V/no D/no C/no acid reflux Musculoskeletal: no muscle aches/no joint aches Skin: no rashes, no hair loss Neurological: no tremors/no numbness/no tingling/no dizziness + decreased sexual arousal  I reviewed pt's medications, allergies, PMH, social hx, family hx, and changes were documented in the history of present illness. Otherwise, unchanged from my initial visit note.  Past Medical History:  Diagnosis Date  . GERD (gastroesophageal reflux disease)    resolved   . PONV (postoperative nausea and vomiting)    mild nausea responsive to meds   . Pulmonary embolism ([place]) 10/2017  . Thyroid carcinoma [facility])    Past Surgical History:  Procedure Laterality Date  . broken wrist    . KNEE SURGERY     arthroscopy   . THYROIDECTOMY Right 07/24/2018   Procedure: RIGHT THYROID LOBECTOMY  AND ISTHMUS;  Surgeon: Armandina Gemma, MD;   Location: [facility];  Service: General;  Laterality: Right;   Social History   Socioeconomic History  . Marital status: Single    Spouse name: Not on file  . Number of children: Not on file  . Years of education: Not on file  . Highest education level: Not on file  Occupational History  . Not on file  Tobacco Use  . Smoking status: Never Smoker  . Smokeless tobacco: Never Used  Vaping Use  . Vaping Use: Never used  Substance and Sexual Activity  . Alcohol use: Yes    Comment: occasional  . Drug use: Never  . Sexual activity: Yes    Birth control/protection: I.U.D.    Comment: Mirena placed 11/04/18  Other Topics Concern  . Not on file  Social History Narrative  . Not on file   Social Determinants of Health   Financial Resource Strain: Not on file  Food Insecurity: Not on file  Transportation Needs: Not on file  Physical Activity: Not on file  Stress: Not on file  Social Connections: Not on file  Intimate Partner Violence: Not on file   Current Outpatient Medications on File Prior to Visit  Medication Sig Dispense Refill  . apixaban (ELIQUIS) 5 MG TABS tablet Take 1 tablet (5 mg total) by mouth 2 (two) times daily. 60 tablet 3  . ELIQUIS 5 MG TABS tablet TAKE ONE TABLET BY MOUTH TWICE A DAY 60 tablet 3  . levonorgestrel (MIRENA) 20 MCG/24HR IUD 1 each by Intrauterine route once. Placed 11/14/18    . levothyroxine (SYNTHROID) 75 MCG tablet TAKE ONE TABLET BY MOUTH DAILY BEFORE BREAKFAST 90 tablet 3  . rosuvastatin (CRESTOR) 5 MG tablet TAKE 1 TABLET BY MOUTH THREE TIMES A WEEK 36 tablet 1  .  Vitamin D, Ergocalciferol, (DRISDOL) 1.25 MG (50000 UNIT) CAPS capsule TAKE ONE CAPSULE BY MOUTH ONCE WEEKLY 12 capsule 3   No current facility-administered medications on file prior to visit.   No Known Allergies Family History  Problem Relation Age of Onset  . Breast cancer Maternal Aunt 32  . Heart disease Paternal Grandmother   . Heart disease Paternal Grandfather      PE: BP 128/82 (BP Location: Left Arm, Patient Position: Sitting, Cuff Size: Normal)   Pulse 82   Ht 5\' 8"  (1.727 m)   Wt 297 lb 3.2 oz (134.8 kg)   SpO2 98%   BMI 45.19 kg/m  Wt Readings from Last 3 Encounters:  10/25/20 297 lb 3.2 oz (134.8 kg)  04/26/20 298 lb (135.2 kg)  03/28/20 298 lb 8 oz (135.4 kg)   Constitutional: overweight, in NAD Eyes: PERRLA, EOMI, no exophthalmos ENT: moist mucous membranes, no thyroid masses palpated, thyroidectomy scar healed, no cervical lymphadenopathy Cardiovascular: RRR, No MRG Respiratory: CTA B Gastrointestinal: abdomen soft, NT, ND, BS+ Musculoskeletal: no deformities, strength intact in all 4 Skin: moist, warm, no rashes Neurological: no tremor with outstretched hands, DTR normal in all 4  ASSESSMENT: 1. Thyroid cancer - see HPI  2. Postsurgical Hypothyroidism  PLAN:  1. Thyroid cancer -papillary -Patient with thyroid cancer stage I TNM pathology -This is a slow-growing cancer, with good prognosis -The tumor was larger than 1.5, however, he did not have aggressive features so I did not recommend completion thyroidectomy or RAI treatment. -We checked her thyroid ultrasound after last visit and this did not show any suspicious masses -Thyroglobulin levels are not checked anymore in the setting of thyroidectomy due to the fact that they are too fluctuating and are reliable -We will continue to follow her clinically and with neck ultrasounds as needed -At today's visit, she has no neck compression symptoms and I cannot feel any masses in her neck  2. Patient with history of partial thyroidectomy for thyroid cancer, now with iatrogenic hypothyroidism, on levothyroxine therapy. - latest thyroid labs reviewed with pt >> normal: Lab Results  Component Value Date   TSH 1.85 10/20/2019   - she continues on LT4 75 mcg daily - pt feels good on this dose. - we discussed about taking the thyroid hormone every day, with water, >30 minutes  before breakfast, separated by >4 hours from acid reflux medications, calcium, iron, multivitamins. Pt. is taking it correctly. - will check thyroid tests today: TSH and fT4 - If labs are abnormal, she will need to return for repeat TFTs in 1.5 months  Component     Latest Ref Rng & Units 10/25/2020  TSH     0.35 - 4.50 uIU/mL 1.45  T4,Free(Direct)     0.60 - 1.60 ng/dL 1.17  TFTs are normal.  Philemon Kingdom, MD PhD [facility]

## 2020-10-25 NOTE — Patient Instructions (Addendum)
Please stop at the lab.  Please continue levothyroxine 75 mcg daily.  Take the thyroid hormone every day, with water, at least 30 minutes before breakfast, separated by at least 4 hours from: - acid reflux medications - calcium - iron - multivitamins  Please come back for a follow-up appointment in 1 year.

## 2020-10-26 ENCOUNTER — Encounter: Payer: Self-pay | Admitting: Internal Medicine

## 2020-11-10 IMAGING — MG DIGITAL SCREENING BILAT W/ TOMO W/ CAD
8 series · 8 of 24 positions shown · non-contrast
Comparison: None.

CLINICAL DATA: Screening.

EXAM:
DIGITAL SCREENING BILATERAL MAMMOGRAM WITH TOMO AND CAD

[L MLO synth-2D]
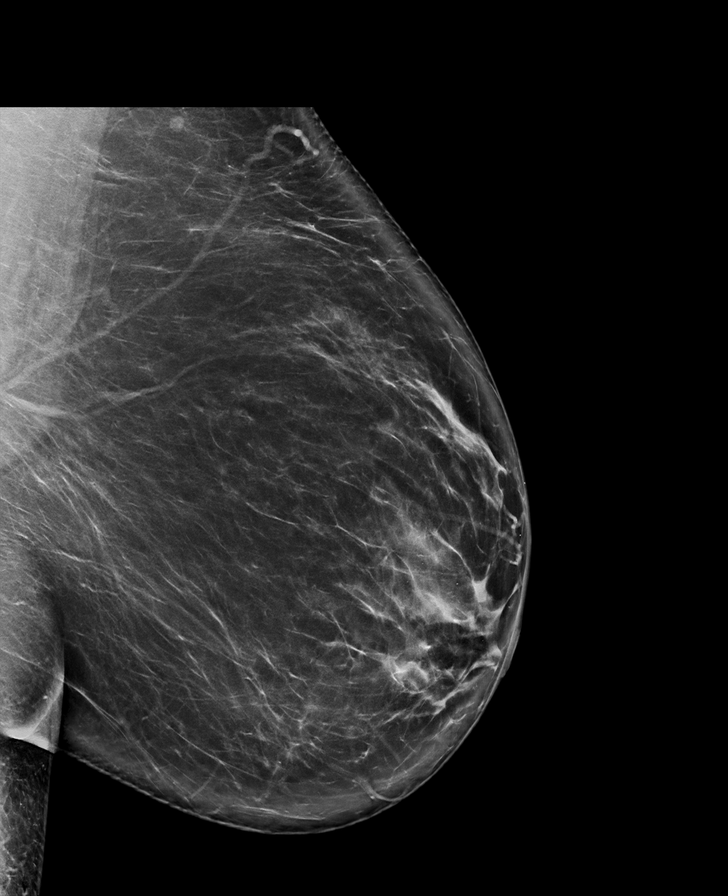

[R MLO synth-2D]
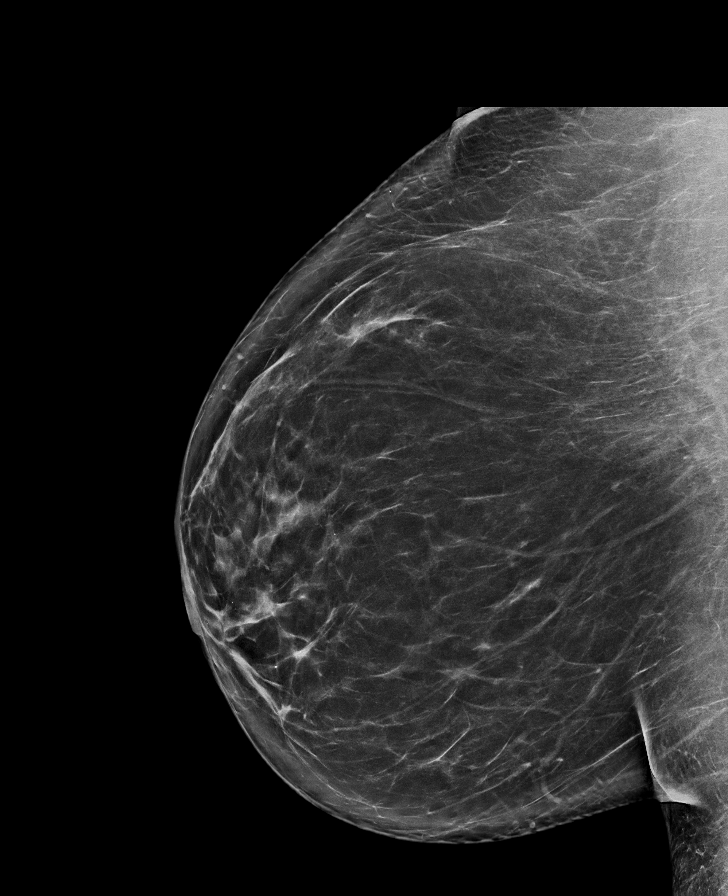

[R CC synth-2D]
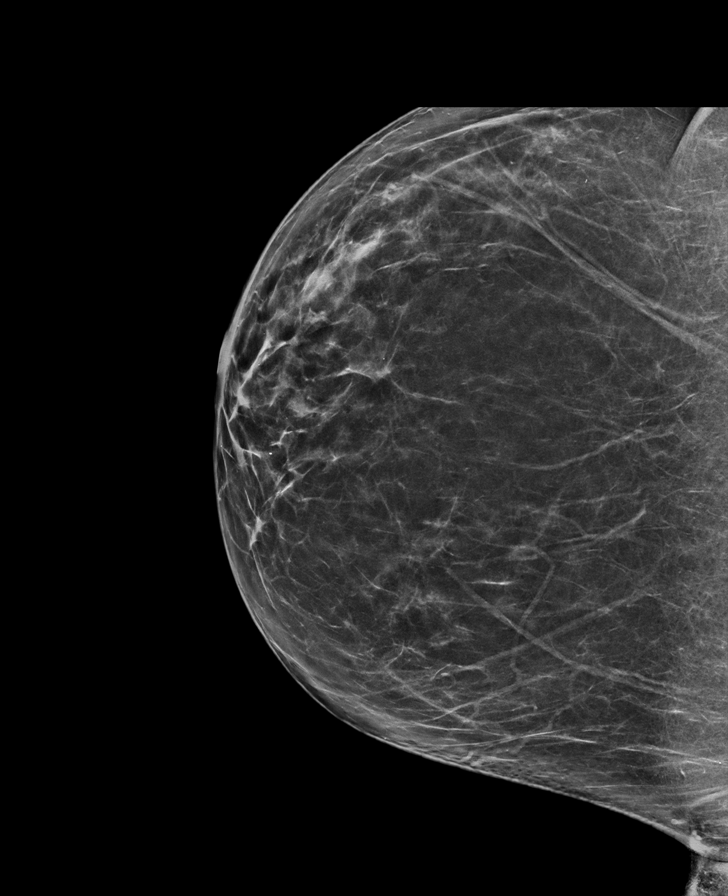

[L CC synth-2D]
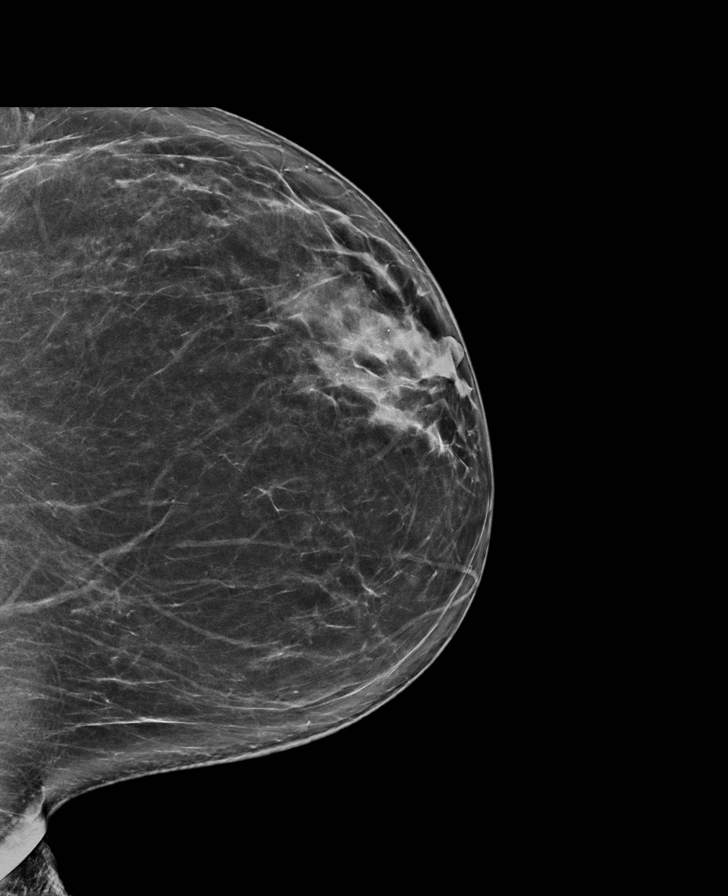

[L MLO tomo · tomo slice 49/98.0]
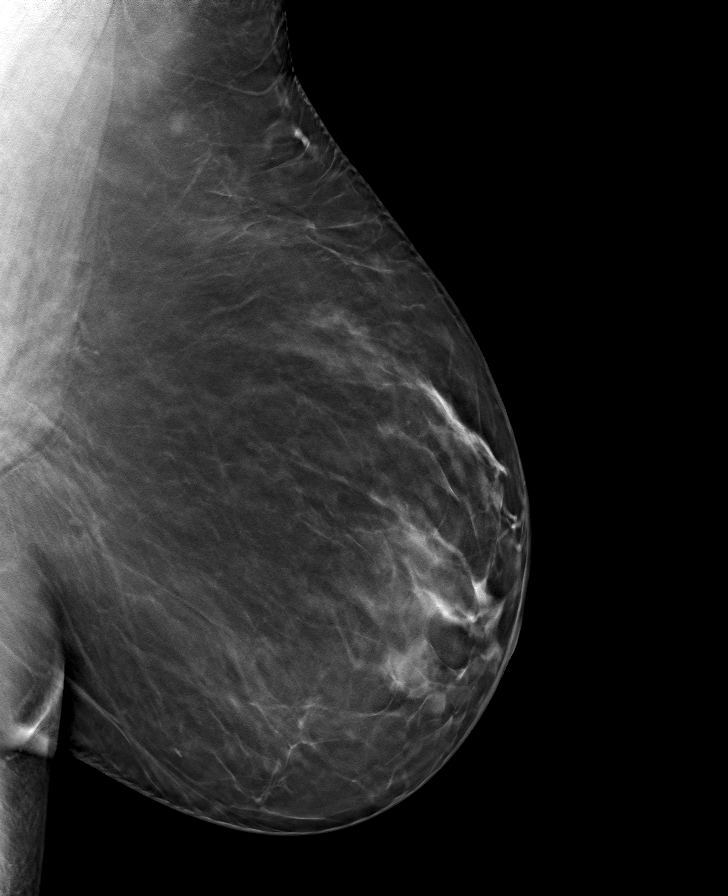

[L CC tomo · tomo slice 43/85.0]
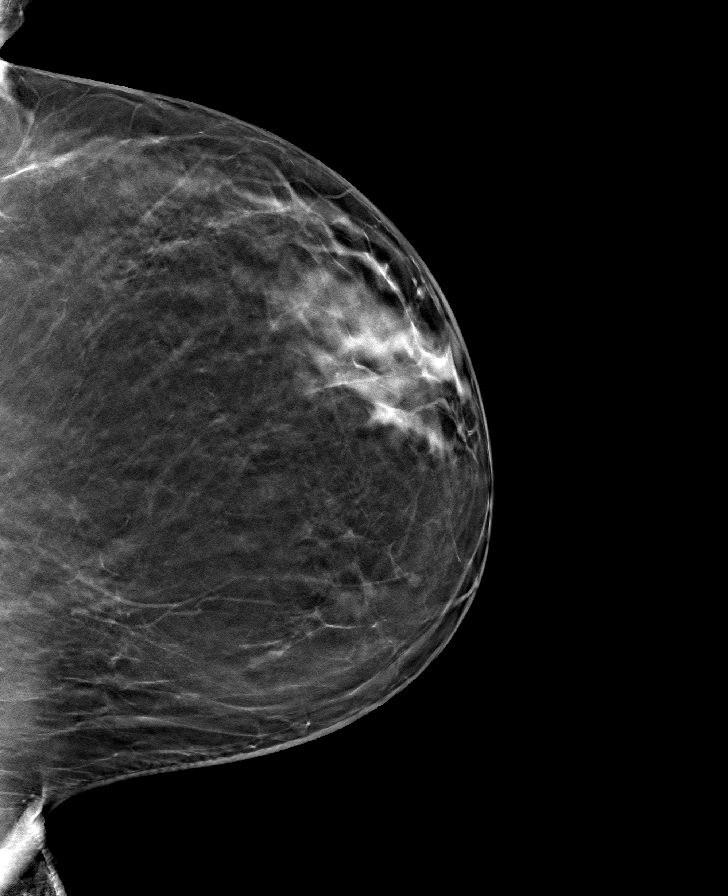

[R MLO tomo · tomo slice 47/93.0]
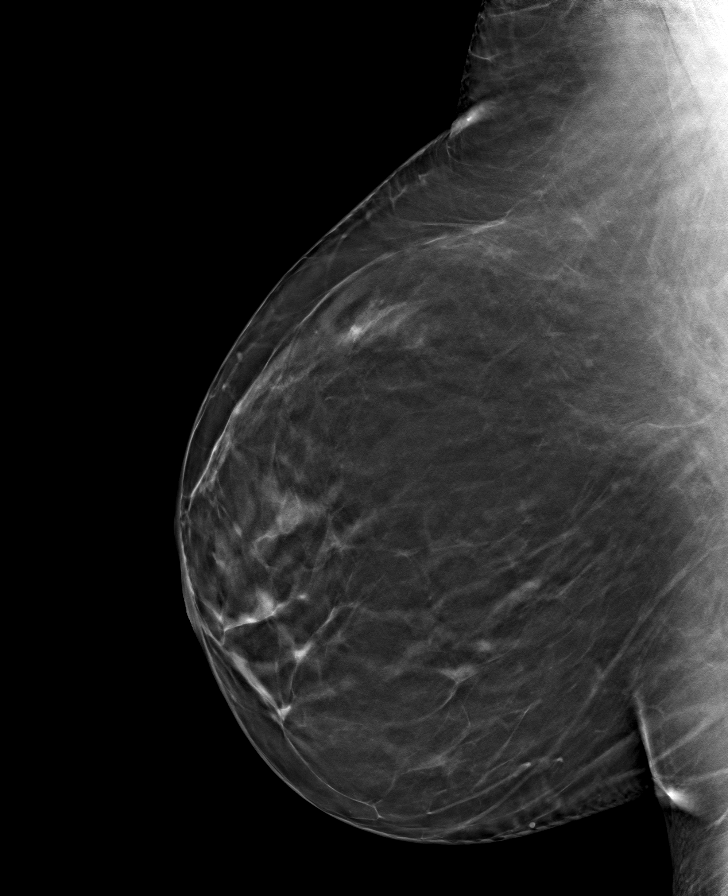

[R CC tomo · tomo slice 40/79.0]
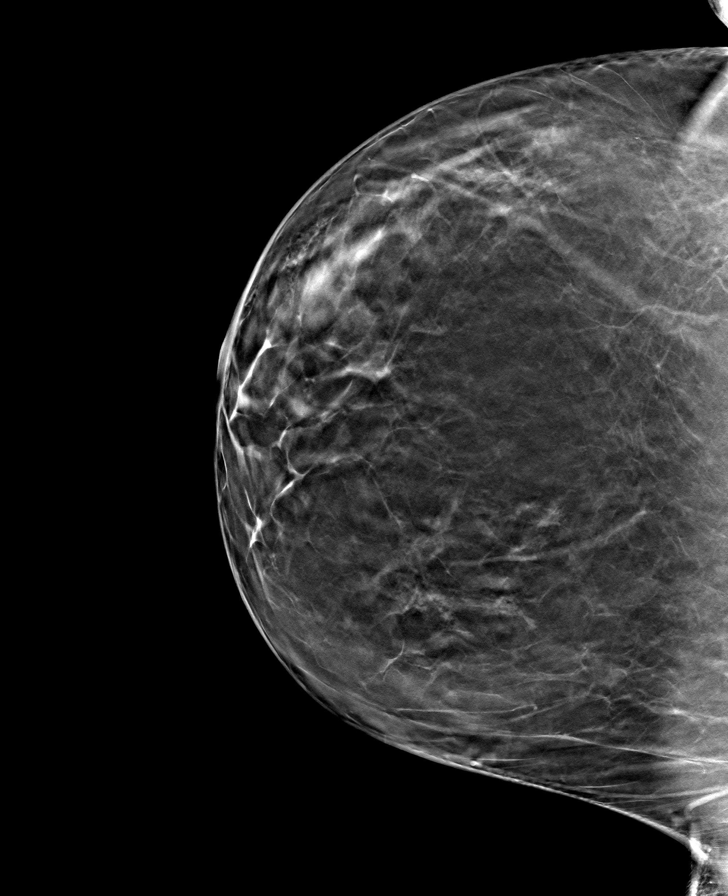

[8 of 24 positions shown; findings below may reference images not displayed]

ACR Breast Density Category b: There are scattered areas of
fibroglandular density.
FINDINGS: There are no findings suspicious for malignancy. Images were
processed with CAD.
IMPRESSION: No mammographic evidence of malignancy. A result letter of this
screening mammogram will be mailed directly to the patient.

RECOMMENDATION:
Screening mammogram in one year. (Code:Y5-G-EJ6)

BI-RADS CATEGORY  1: Negative.

## 2020-11-22 ENCOUNTER — Other Ambulatory Visit: Payer: Self-pay

## 2020-11-22 DIAGNOSIS — Z111 Encounter for screening for respiratory tuberculosis: Secondary | ICD-10-CM

## 2020-11-29 ENCOUNTER — Ambulatory Visit
Admission: RE | Admit: 2020-11-29 | Discharge: 2020-11-29 | Disposition: A | Discharge status: Home / Self Care | Payer: BC Managed Care – PPO | Source: Ambulatory Visit | Attending: Internal Medicine | Admitting: Internal Medicine

## 2020-11-29 DIAGNOSIS — Z111 Encounter for screening for respiratory tuberculosis: Secondary | ICD-10-CM

## 2020-11-29 DIAGNOSIS — R7612 Nonspecific reaction to cell mediated immunity measurement of gamma interferon antigen response without active tuberculosis: Secondary | ICD-10-CM | POA: Diagnosis not present

## 2020-11-29 DIAGNOSIS — M47814 Spondylosis without myelopathy or radiculopathy, thoracic region: Secondary | ICD-10-CM | POA: Diagnosis not present

## 2020-12-03 ENCOUNTER — Other Ambulatory Visit: Payer: Self-pay | Admitting: Internal Medicine

## 2020-12-14 ENCOUNTER — Other Ambulatory Visit: Payer: Self-pay | Admitting: Oncology

## 2021-01-11 ENCOUNTER — Encounter: Payer: Self-pay | Admitting: Internal Medicine

## 2021-01-11 ENCOUNTER — Other Ambulatory Visit: Payer: Self-pay

## 2021-01-11 ENCOUNTER — Telehealth: Payer: Self-pay | Admitting: Internal Medicine

## 2021-01-11 ENCOUNTER — Ambulatory Visit (INDEPENDENT_AMBULATORY_CARE_PROVIDER_SITE_OTHER): Payer: No Typology Code available for payment source | Admitting: Internal Medicine

## 2021-01-11 VITALS — BP 120/80 | HR 96 | Temp 99.0°F | Ht 68.0 in | Wt 290.0 lb

## 2021-01-11 DIAGNOSIS — R509 Fever, unspecified: Secondary | ICD-10-CM

## 2021-01-11 DIAGNOSIS — R059 Cough, unspecified: Secondary | ICD-10-CM

## 2021-01-11 LAB — POCT RAPID STREP A (OFFICE): Rapid Strep A Screen: NEGATIVE

## 2021-01-11 MED ORDER — CEFTRIAXONE SODIUM 1 G IJ SOLR
1.0000 g | Freq: Once | INTRAMUSCULAR | Status: AC
  Administered 2021-01-11: 1 g via INTRAMUSCULAR

## 2021-01-11 MED ORDER — LEVOFLOXACIN 500 MG PO TABS: 500.0000 mg | ORAL_TABLET | Freq: Every day | ORAL | 0 refills | Status: DC

## 2021-01-11 MED ORDER — HYDROCODONE BIT-HOMATROP MBR 5-1.5 MG/5ML PO SOLN: 5.0000 mL | Freq: Three times a day (TID) | ORAL | 0 refills | Status: DC | PRN

## 2021-01-11 NOTE — Progress Notes (Signed)
   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 Has been ill for 8 or 9 days. Took 3 Covid tests all of which were negative.  Has left-sided sore throat.  Has malaise and fatigue.  Did 3 home COVID tests which were negative.  Records in [facility] indicate she had COVID-19 vaccines in January 2021.  Our records indicate 2 COVID-19 vaccines in 2021.  Review of Systems no nausea, vomiting, dysgeusia, headache     Objective:   Physical Exam Blood pressure 120/80, pulse 96, temperature 99 degrees, pulse oximetry 98%, weight 290 pounds, height 5 feet 8 inches, BMI 44.99  Pharynx is red.  Rapid strep screen is negative.  Left TM full. Right TM slightly full. Neck supple. Chest clear to auscultation.       Assessment & Plan:  Acute non strep pharyngitis-rapid strep screen negative  Acute bilateral otitis media  Acute respiratory infection-rule out COVID-19  Plan: COVID PCR test, Flu test, and Respiratory virus panel obtained.  Advised to rest at home today and await test results.  Started on Levaquin 500 mg daily for 10 days.  Hycodan 1 teaspoon every 8 hours as needed for cough and sore throat pain.  Stay well-hydrated. One gram IM Rocephin given.

## 2021-01-11 NOTE — Telephone Encounter (Signed)
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 She has ear pain, cough, nasal congestion and low grade fever. Would like to be seen today if at all possible.

## 2021-01-11 NOTE — Telephone Encounter (Signed)
Scheduled Office Visit after talking with DR Renold Genta

## 2021-01-12 LAB — TIQ-MISC

## 2021-01-13 NOTE — Patient Instructions (Addendum)
Rocephin one gram IM given in office. Covid 19 PCR test obtained as well as Flu test and Respiratory virus panel. Take Levaquin 500 mg daily x 10 days. Hycodan one tsp every 8 hours as needed for cough.

## 2021-01-14 ENCOUNTER — Telehealth: Payer: Self-pay | Admitting: Internal Medicine

## 2021-01-14 NOTE — Telephone Encounter (Signed)
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 Previously these were resulting in 48 hours. 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 MJB

## 2021-01-16 LAB — SARS-COV-2 RNA, INFLUENZA A/B, AND RSV RNA, QUALITATIVE NAAT
INFLUENZA A RNA: NOT DETECTED
INFLUENZA B RNA: NOT DETECTED
RSV RNA: NOT DETECTED
SARS COV2 RNA: NOT DETECTED

## 2021-01-25 ENCOUNTER — Ambulatory Visit
Admission: RE | Admit: 2021-01-25 | Discharge: 2021-01-25 | Disposition: A | Discharge status: Home / Self Care | Payer: Self-pay | Source: Ambulatory Visit | Attending: Internal Medicine | Admitting: Internal Medicine

## 2021-01-25 ENCOUNTER — Other Ambulatory Visit: Payer: Self-pay

## 2021-01-25 DIAGNOSIS — R053 Chronic cough: Secondary | ICD-10-CM

## 2021-01-26 ENCOUNTER — Other Ambulatory Visit: Payer: Self-pay

## 2021-01-26 MED ORDER — METHYLPREDNISOLONE 4 MG PO TABS: ORAL_TABLET | ORAL | 0 refills | Status: DC

## 2021-01-26 NOTE — Telephone Encounter (Signed)
Patient notified of results she is asking for a short term of steroids or something to inhale?

## 2021-02-07 ENCOUNTER — Ambulatory Visit (HOSPITAL_BASED_OUTPATIENT_CLINIC_OR_DEPARTMENT_OTHER): Payer: BC Managed Care – PPO | Admitting: Obstetrics & Gynecology

## 2021-02-17 ENCOUNTER — Other Ambulatory Visit: Payer: Self-pay | Admitting: Internal Medicine

## 2021-03-07 ENCOUNTER — Ambulatory Visit (HOSPITAL_BASED_OUTPATIENT_CLINIC_OR_DEPARTMENT_OTHER): Payer: BC Managed Care – PPO | Admitting: Obstetrics & Gynecology

## 2021-03-08 ENCOUNTER — Encounter (HOSPITAL_BASED_OUTPATIENT_CLINIC_OR_DEPARTMENT_OTHER): Payer: Self-pay | Admitting: Obstetrics & Gynecology

## 2021-03-08 ENCOUNTER — Other Ambulatory Visit: Payer: Self-pay

## 2021-03-08 ENCOUNTER — Other Ambulatory Visit (HOSPITAL_COMMUNITY)
Admission: RE | Admit: 2021-03-08 | Discharge: 2021-03-08 | Disposition: A | Discharge status: Home / Self Care | Payer: No Typology Code available for payment source | Source: Ambulatory Visit | Attending: Obstetrics & Gynecology | Admitting: Obstetrics & Gynecology

## 2021-03-08 ENCOUNTER — Ambulatory Visit (INDEPENDENT_AMBULATORY_CARE_PROVIDER_SITE_OTHER): Payer: No Typology Code available for payment source | Admitting: Obstetrics & Gynecology

## 2021-03-08 VITALS — BP 118/92 | HR 78 | Ht 67.0 in | Wt 295.4 lb

## 2021-03-08 DIAGNOSIS — I2699 Other pulmonary embolism without acute cor pulmonale: Secondary | ICD-10-CM | POA: Diagnosis not present

## 2021-03-08 DIAGNOSIS — E890 Postprocedural hypothyroidism: Secondary | ICD-10-CM

## 2021-03-08 DIAGNOSIS — Z01419 Encounter for gynecological examination (general) (routine) without abnormal findings: Secondary | ICD-10-CM | POA: Diagnosis not present

## 2021-03-08 DIAGNOSIS — C73 Malignant neoplasm of thyroid gland: Secondary | ICD-10-CM | POA: Diagnosis not present

## 2021-03-08 DIAGNOSIS — Z124 Encounter for screening for malignant neoplasm of cervix: Secondary | ICD-10-CM

## 2021-03-08 DIAGNOSIS — Z1231 Encounter for screening mammogram for malignant neoplasm of breast: Secondary | ICD-10-CM

## 2021-03-08 NOTE — Progress Notes (Addendum)
40 y.o. G0P0000 Single White or Caucasian female here for annual exam.  H/o thyroid cancer s/p thyroid cancer.  Followed by Dr. Cruzita Lederer.  Had URI this year that was tested multiple times for Covid.  All were negative.  Mirena placed 11/14/18.  Cycles last two days with some spotting.  About 75% lighter than prior to Mirena.  No LMP recorded. (Menstrual status: IUD).          Sexually active: Yes.    The current method of family planning is IUD.    Exercising: Yes.    Smoker:  no  Health Maintenance: Pap:  12/17/2017 Negative History of abnormal Pap:  no MMG:  06/30/2019 Negative Colonoscopy:  guidelines reviewed Hep C testing: reviewed Screening Labs: does with Dr. Renold Genta   reports that she has never smoked. She has never used smokeless tobacco. She reports current alcohol use. She reports that she does not use drugs.  Past Medical History:  Diagnosis Date   GERD (gastroesophageal reflux disease)    resolved    PONV (postoperative nausea and vomiting)    mild nausea responsive to meds    Pulmonary embolism ([place]) 10/2017   Thyroid carcinoma ([facility])     Past Surgical History:  Procedure Laterality Date   broken wrist     KNEE SURGERY     arthroscopy    THYROIDECTOMY Right 07/24/2018   Procedure: RIGHT THYROID LOBECTOMY  AND ISTHMUS;  Surgeon: Armandina Gemma, MD;  Location: [facility];  Service: General;  Laterality: Right;    Current Outpatient Medications  Medication Sig Dispense Refill   ELIQUIS 5 MG TABS tablet TAKE ONE TABLET BY MOUTH TWICE A DAY 60 tablet 3   levonorgestrel (MIRENA) 20 MCG/24HR IUD 1 each by Intrauterine route once. Placed 11/14/18     levothyroxine (SYNTHROID) 75 MCG tablet TAKE ONE TABLET BY MOUTH DAILY BEFORE BREAKFAST 90 tablet 3   rosuvastatin (CRESTOR) 5 MG tablet TAKE ONE TABLET BY MOUTH THREE TIMES A WEEK 36 tablet 1   Vitamin D, Ergocalciferol, (DRISDOL) 1.25 MG (50000 UNIT) CAPS capsule TAKE ONE CAPSULE BY MOUTH ONCE WEEKLY 12 capsule 3   No current  facility-administered medications for this visit.    Family History  Problem Relation Age of Onset   Breast cancer Maternal Aunt 17   Heart disease Paternal Grandmother    Heart disease Paternal Grandfather     Review of Systems  All other systems reviewed and are negative.  Exam:   BP (!) 118/92 (BP Location: Right Arm, Patient Position: Sitting, Cuff Size: Large)   Pulse 78   Ht 5\' 7"  (1.702 m)   Wt 295 lb 6.4 oz (134 kg)   BMI 46.27 kg/m   Height: 5\' 7"  (170.2 cm)  Wt: -42lb  General appearance: alert, cooperative and appears stated age Head: Normocephalic, without obvious abnormality, atraumatic Neck: no adenopathy, supple, symmetrical, trachea midline and thyroid normal to inspection and palpation Lungs: clear to auscultation bilaterally Breasts: normal appearance, no masses or tenderness Heart: regular rate and rhythm Abdomen: soft, non-tender; bowel sounds normal; no masses,  no organomegaly Extremities: extremities normal, atraumatic, no cyanosis or edema Skin: Skin color, texture, turgor normal. No rashes or lesions Lymph nodes: Cervical, supraclavicular, and axillary nodes normal. No abnormal inguinal nodes palpated Neurologic: Grossly normal  Pelvic: External genitalia:  no lesions              Urethra:  normal appearing urethra with no masses, tenderness or lesions  Bartholins and Skenes: normal                 Vagina: normal appearing vagina with normal color and no discharge, no lesions              Cervix: no lesions, IUD string noted              Pap taken: Yes.   Bimanual Exam:  Uterus:  normal size, contour, position, consistency, mobility, non-tender              Adnexa: normal adnexa and no mass, fullness, tenderness               Rectovaginal: Confirms               Anus:  normal sphincter tone, no lesions  Chaperone, Octaviano Batty, CMA, was present for exam.  Assessment/Plan: 1. Well woman exam with routine gynecological exam -- Pap  smear obtained today - Mammogram 06/2019 - Colonoscopy guidelines reviewed - lab work will be done in December with Dr. Renold Genta - vaccines reviewed/updated  2. Postsurgical hypothyroidism  3. Papillary thyroid carcinoma ([place]) - followed by Dr. Cruzita Lederer  4. Acute pulmonary embolism without acute cor pulmonale, unspecified pulmonary embolism type ([facility]) - on Eliquis

## 2021-03-08 NOTE — Patient Instructions (Signed)
Revaree and replans vaginal moisturizer

## 2021-03-14 ENCOUNTER — Telehealth (HOSPITAL_BASED_OUTPATIENT_CLINIC_OR_DEPARTMENT_OTHER): Payer: Self-pay | Admitting: Obstetrics & Gynecology

## 2021-03-14 ENCOUNTER — Encounter (HOSPITAL_BASED_OUTPATIENT_CLINIC_OR_DEPARTMENT_OTHER): Payer: Self-pay

## 2021-03-14 LAB — CYTOLOGY - PAP
Comment: NEGATIVE
Comment: NEGATIVE
Diagnosis: UNDETERMINED — AB
HPV 16: POSITIVE — AB
HPV 18 / 45: NEGATIVE
High risk HPV: POSITIVE — AB

## 2021-03-14 NOTE — Telephone Encounter (Signed)
Patient call and said that she is returning your call Kenney Houseman

## 2021-03-28 ENCOUNTER — Ambulatory Visit: Payer: BC Managed Care – PPO | Admitting: Oncology

## 2021-04-11 ENCOUNTER — Inpatient Hospital Stay: Payer: No Typology Code available for payment source | Attending: Oncology | Admitting: Oncology

## 2021-04-11 ENCOUNTER — Other Ambulatory Visit: Payer: Self-pay

## 2021-04-11 ENCOUNTER — Telehealth: Payer: Self-pay | Admitting: Internal Medicine

## 2021-04-11 VITALS — BP 128/96 | HR 82 | Temp 98.0°F | Resp 18 | Ht 67.0 in | Wt 295.6 lb

## 2021-04-11 DIAGNOSIS — E890 Postprocedural hypothyroidism: Secondary | ICD-10-CM | POA: Diagnosis not present

## 2021-04-11 DIAGNOSIS — Z7901 Long term (current) use of anticoagulants: Secondary | ICD-10-CM | POA: Diagnosis not present

## 2021-04-11 DIAGNOSIS — Z86711 Personal history of pulmonary embolism: Secondary | ICD-10-CM | POA: Diagnosis not present

## 2021-04-11 DIAGNOSIS — E785 Hyperlipidemia, unspecified: Secondary | ICD-10-CM | POA: Insufficient documentation

## 2021-04-11 DIAGNOSIS — I2699 Other pulmonary embolism without acute cor pulmonale: Secondary | ICD-10-CM

## 2021-04-11 DIAGNOSIS — Z79899 Other long term (current) drug therapy: Secondary | ICD-10-CM | POA: Insufficient documentation

## 2021-04-11 DIAGNOSIS — Z85850 Personal history of malignant neoplasm of thyroid: Secondary | ICD-10-CM | POA: Insufficient documentation

## 2021-04-11 MED ORDER — APIXABAN 5 MG PO TABS: 5.0000 mg | ORAL_TABLET | Freq: Two times a day (BID) | ORAL | 3 refills | Status: DC

## 2021-04-11 NOTE — Telephone Encounter (Signed)
-----   Message from Elby Showers, MD sent at 04/11/2021 12:50 PM EDT ----- Please schedule appt to evaluate BP which was elevated with Dr. Benay Spice ----- Message ----- From: Ladell Pier, MD Sent: 04/11/2021  12:48 PM EDT To: Elby Showers, MD

## 2021-04-11 NOTE — Progress Notes (Signed)
  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  No bleeding or symptom of recurrent thrombosis.  She feels well.  Vaginal bleeding improved with placement of a Mirena device.  Objective:  Vital signs in last 24 hours:  Blood pressure (!) 128/96, pulse 82, temperature 98 F (36.7 C), temperature source Oral, resp. rate 18, height 5\' 7"  (1.702 m), weight 295 lb 9.6 oz (134.1 kg), SpO2 100 %.     Resp: Lungs clear bilaterally Cardio: Regular rate and rhythm GI: No hepatosplenomegaly Vascular: No leg edema   Lab Results:  Lab Results  Component Value Date   WBC 4.9 04/30/2019   HGB 15.6 (H) 04/30/2019   HCT 45.2 (H) 04/30/2019   MCV 94.4 04/30/2019   PLT 254 04/30/2019   NEUTROABS 2,867 04/30/2019    CMP  Lab Results  Component Value Date   NA 136 04/30/2019   K 4.8 04/30/2019   CL 104 04/30/2019   CO2 21 04/30/2019   GLUCOSE 97 04/30/2019   BUN 16 04/30/2019   CREATININE 0.85 04/30/2019   CALCIUM 9.0 04/30/2019   PROT 6.3 08/17/2019   ALBUMIN 3.8 11/18/2017   AST 13 08/17/2019   ALT 10 08/17/2019   ALKPHOS 76 11/18/2017   BILITOT 0.6 08/17/2019   GFRNONAA 86 04/30/2019   GFRAA 99 04/30/2019     Medications: I have reviewed the patient's current medications.   Assessment/Plan: 1. Acute bilateral pulmonary emboli November 18, 2017-maintained on indefinite apixaban anticoagulation Negative hypercoagulation panel 11/18/2017 Elevated D-dimer April 2020 while off of apixaban for 1 month 2.  Papillary thyroid cancer, status post a right thyroidectomy 07/24/2018, 1.8 cm, no extrathyroidal extension, negative resection margins, 1 - lymph node 3.  Postsurgical hypothyroidism 4.  COVID-19 infection February 2021 5.  Zoster April 2021-eye involvement 6.  History of menorrhagia-maintained on Mirena IUD 7.  Hyperlipidemia 8.  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  She is tolerating the apixaban well.  We discussed the risk/benefit of continuing anticoagulation therapy.  The plan is to continue indefinite apixaban anticoagulation as recommended by Dr. Joan Flores.  Dr. Marcello Moores will return for an office visit in 1 year.  She will follow-up with Dr. Renold Genta for management of hypertension.  Betsy Coder, MD  04/11/2021  12:20 PM

## 2021-04-11 NOTE — Telephone Encounter (Signed)
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

## 2021-04-11 NOTE — Telephone Encounter (Signed)
error 

## 2021-04-12 ENCOUNTER — Other Ambulatory Visit: Payer: Self-pay | Admitting: Internal Medicine

## 2021-04-18 ENCOUNTER — Other Ambulatory Visit: Payer: Self-pay

## 2021-04-18 ENCOUNTER — Ambulatory Visit (INDEPENDENT_AMBULATORY_CARE_PROVIDER_SITE_OTHER): Payer: No Typology Code available for payment source | Admitting: Internal Medicine

## 2021-04-18 ENCOUNTER — Encounter: Payer: Self-pay | Admitting: Internal Medicine

## 2021-04-18 VITALS — BP 126/84 | HR 88 | Temp 99.0°F | Ht 67.0 in | Wt 291.0 lb

## 2021-04-18 DIAGNOSIS — R0989 Other specified symptoms and signs involving the circulatory and respiratory systems: Secondary | ICD-10-CM

## 2021-04-18 DIAGNOSIS — Z85850 Personal history of malignant neoplasm of thyroid: Secondary | ICD-10-CM

## 2021-04-18 DIAGNOSIS — Z86711 Personal history of pulmonary embolism: Secondary | ICD-10-CM

## 2021-04-18 DIAGNOSIS — Z6841 Body Mass Index (BMI) 40.0 and over, adult: Secondary | ICD-10-CM

## 2021-04-18 DIAGNOSIS — E7800 Pure hypercholesterolemia, unspecified: Secondary | ICD-10-CM

## 2021-04-18 DIAGNOSIS — Z7901 Long term (current) use of anticoagulants: Secondary | ICD-10-CM | POA: Diagnosis not present

## 2021-04-18 MED ORDER — METOPROLOL TARTRATE 25 MG PO TABS: 12.5000 mg | ORAL_TABLET | Freq: Two times a day (BID) | ORAL | 3 refills | Status: DC

## 2021-04-18 NOTE — Patient Instructions (Addendum)
Will schedule CPE for January with fasting labs. Try Lopressor 12.5 mg (I/2 of a 25 mg tab)  every morning and monitor BP readings.

## 2021-04-18 NOTE — Progress Notes (Signed)
   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 Benay Spice for follow-up on chronic anticoagulation with apixaban at which time blood pressure was noted to be 128/96.  We advised her to keep take multiple blood pressure readings after she notified me of this elevation.  Readings brought in today show diastolic BPs 35-36 and  Systolic BPs 144- 315.  Tends to be more elevated at work she says.  She feels well and has no new complaints.  She is on chronic anticoagulation for acute bilateral pulmonary emboli in June 2019 and it is recommended by Dr.Sherrill  and Dr. Joan Flores that she remain on indefinite apixaban anticoagulation due to this.  I would agree with that decision.  She has postsurgical hypothyroidism, history of COVID-19 in February 2021.  History of papillary thyroid cancer status post right thyroidectomy February 2020.  Has Mirena IUD with history of menorrhagia.  EKG from 2019 reviewed  Review of Systems no new complaints-feels well     Objective:   Physical Exam Blood pressure today is excellent at 126/84 pulse 88 temperature 99 degrees pulse oximetry 99% weight 291 pounds BMI 45.58  Neck is supple without JVD thyromegaly or carotid bruits.  No thyroid enlargement or nodules palpated.  Chest clear.  Cardiac exam: Regular rate and rhythm without ectopy.  No lower extremity pitting edema today     Assessment & Plan:   We discussed various options.  Amlodipine might cause some dependent edema but usually not at lower dose.  Says that her heart rate tends to be on the high side particularly when she is working.  She might benefit from Lopressor once or twice daily.  She will start with 12.5 mg daily and may very well need 12.5 mg twice daily or even lower.  And monitor her readings.  She will let me know what her blood pressure readings are.  We will plan to follow-up at time of her CPE in  January or sooner if necessary.  She will remain on thyroid replacement medication with history of partial thyroidectomy.  Her LDL was elevated in 2021 and I think she would benefit from low-dose statin therapy.  We can follow-up in January.  She will be starting Crestor 5 mg 3 times a week.  Consider coronary calcium scoring.

## 2021-04-19 MED ORDER — ROSUVASTATIN CALCIUM 5 MG PO TABS: ORAL_TABLET | ORAL | 3 refills | Status: DC

## 2021-04-27 DIAGNOSIS — E7800 Pure hypercholesterolemia, unspecified: Secondary | ICD-10-CM | POA: Insufficient documentation

## 2021-04-27 DIAGNOSIS — I10 Essential (primary) hypertension: Secondary | ICD-10-CM | POA: Insufficient documentation

## 2021-05-18 ENCOUNTER — Encounter: Payer: Self-pay | Admitting: Internal Medicine

## 2021-05-22 ENCOUNTER — Other Ambulatory Visit: Payer: Self-pay

## 2021-05-22 ENCOUNTER — Ambulatory Visit (INDEPENDENT_AMBULATORY_CARE_PROVIDER_SITE_OTHER): Payer: No Typology Code available for payment source | Admitting: Obstetrics & Gynecology

## 2021-05-22 ENCOUNTER — Other Ambulatory Visit (HOSPITAL_COMMUNITY)
Admission: RE | Admit: 2021-05-22 | Discharge: 2021-05-22 | Disposition: A | Discharge status: Home / Self Care | Payer: No Typology Code available for payment source | Source: Ambulatory Visit | Attending: Obstetrics & Gynecology | Admitting: Obstetrics & Gynecology

## 2021-05-22 VITALS — BP 129/70 | HR 66 | Wt 296.8 lb

## 2021-05-22 DIAGNOSIS — R87610 Atypical squamous cells of undetermined significance on cytologic smear of cervix (ASC-US): Secondary | ICD-10-CM | POA: Insufficient documentation

## 2021-05-22 DIAGNOSIS — R87810 Cervical high risk human papillomavirus (HPV) DNA test positive: Secondary | ICD-10-CM

## 2021-05-24 ENCOUNTER — Other Ambulatory Visit: Payer: Self-pay

## 2021-05-24 DIAGNOSIS — Z136 Encounter for screening for cardiovascular disorders: Secondary | ICD-10-CM

## 2021-05-25 ENCOUNTER — Encounter (HOSPITAL_BASED_OUTPATIENT_CLINIC_OR_DEPARTMENT_OTHER): Payer: Self-pay | Admitting: Obstetrics & Gynecology

## 2021-05-25 LAB — SURGICAL PATHOLOGY

## 2021-05-25 NOTE — Progress Notes (Signed)
40 y.o. G0P0000 Single White female here for colposcopy with possible biopsies and/or ECC due to ASCUS pap with +HR HPV Pap obtained 03/08/2021.  HPV 16 subtyping was positive.  Pt does have new partner since prior pap smear screening.  Findings reviewed.  Questions answered.    Prior evaluation/treatment:  none.  No LMP recorded. (Menstrual status: IUD).          Sexually active: Yes.    The current method of family planning is IUD.     Patient has been counseled about results and procedure.  Risks and benefits have bene reviewed including immediate and/or delayed bleeding, infection, cervical scaring from procedure, possibility of needing additional follow up as well as treatment.  Rare risks of missing a lesion discussed as well.  All questions answered.  Pt ready to proceed.  Consent obtained.  BP 129/70   Pulse 66   Wt 296 lb 12.8 oz (134.6 kg)   BMI 46.49 kg/m   General appearance: alert, cooperative and appears stated age Lymph nodes: No abnormal inguinal nodes palpated Neurologic: Grossly normal  Pelvic: External genitalia:  no lesions              Urethra:  normal appearing urethra with no masses, tenderness or lesions              Bartholins and Skenes: normal                 Vagina: normal appearing vagina with normal color and no discharge, no lesions               Physical Exam Constitutional:      Appearance: Normal appearance.  Genitourinary:    General: Normal vulva.     Vagina: Normal.     Neurological:     General: No focal deficit present.     Mental Status: She is alert.  Psychiatric:        Mood and Affect: Mood normal.    Speculum placed.  3% acetic acid applied to cervix for >45 seconds.  Cervix visualized with both 7.5X and 15X magnification.  Green filter also used.  Lugols solution was not used.  Findings:  Small area of AWE noted right at 12 o'clock.  Biopsy:  attempts made to biopsy this location but it was just endocervical and could not get biopsy  forceps in exact location.  ECC:  was performed and location of AWE was scraped with ECC for cell sample.  Monsel's was not needed.  Excellent hemostasis was present.  Pt tolerated procedure well and all instruments were removed.  Findings noted above on picture of cervix.  Chaperone, Octaviano Batty, was present during procedure.  Assessment/Plan: 1. ASCUS with positive high risk HPV cervical - Surgical pathology( [facility]/ [facility]) - Pathology results will be called to patient and follow-up planned pending results.

## 2021-06-27 ENCOUNTER — Ambulatory Visit (HOSPITAL_BASED_OUTPATIENT_CLINIC_OR_DEPARTMENT_OTHER)
Admission: RE | Admit: 2021-06-27 | Discharge: 2021-06-27 | Disposition: A | Discharge status: Home / Self Care | Payer: No Typology Code available for payment source | Source: Ambulatory Visit | Attending: Obstetrics & Gynecology | Admitting: Obstetrics & Gynecology

## 2021-06-27 ENCOUNTER — Other Ambulatory Visit: Payer: Self-pay

## 2021-06-27 DIAGNOSIS — Z1231 Encounter for screening mammogram for malignant neoplasm of breast: Secondary | ICD-10-CM

## 2021-07-10 ENCOUNTER — Other Ambulatory Visit: Payer: Self-pay

## 2021-07-10 ENCOUNTER — Other Ambulatory Visit: Payer: No Typology Code available for payment source | Admitting: Internal Medicine

## 2021-07-10 DIAGNOSIS — E559 Vitamin D deficiency, unspecified: Secondary | ICD-10-CM

## 2021-07-10 DIAGNOSIS — E7800 Pure hypercholesterolemia, unspecified: Secondary | ICD-10-CM

## 2021-07-10 DIAGNOSIS — C73 Malignant neoplasm of thyroid gland: Secondary | ICD-10-CM

## 2021-07-10 DIAGNOSIS — Z0000 Encounter for general adult medical examination without abnormal findings: Secondary | ICD-10-CM

## 2021-07-10 NOTE — Progress Notes (Signed)
Added test

## 2021-07-11 ENCOUNTER — Ambulatory Visit (INDEPENDENT_AMBULATORY_CARE_PROVIDER_SITE_OTHER): Payer: No Typology Code available for payment source | Admitting: Internal Medicine

## 2021-07-11 VITALS — HR 91 | Temp 98.5°F | Ht 68.0 in | Wt 301.0 lb

## 2021-07-11 DIAGNOSIS — Z8639 Personal history of other endocrine, nutritional and metabolic disease: Secondary | ICD-10-CM

## 2021-07-11 DIAGNOSIS — R0989 Other specified symptoms and signs involving the circulatory and respiratory systems: Secondary | ICD-10-CM | POA: Diagnosis not present

## 2021-07-11 DIAGNOSIS — Z6841 Body Mass Index (BMI) 40.0 and over, adult: Secondary | ICD-10-CM

## 2021-07-11 DIAGNOSIS — Z7901 Long term (current) use of anticoagulants: Secondary | ICD-10-CM

## 2021-07-11 DIAGNOSIS — I10 Essential (primary) hypertension: Secondary | ICD-10-CM | POA: Diagnosis not present

## 2021-07-11 DIAGNOSIS — Z85850 Personal history of malignant neoplasm of thyroid: Secondary | ICD-10-CM

## 2021-07-11 DIAGNOSIS — E7800 Pure hypercholesterolemia, unspecified: Secondary | ICD-10-CM

## 2021-07-11 DIAGNOSIS — Z86711 Personal history of pulmonary embolism: Secondary | ICD-10-CM

## 2021-07-11 DIAGNOSIS — Z0000 Encounter for general adult medical examination without abnormal findings: Secondary | ICD-10-CM | POA: Diagnosis not present

## 2021-07-11 LAB — POCT URINALYSIS DIPSTICK
Bilirubin, UA: NEGATIVE
Blood, UA: NEGATIVE
Glucose, UA: NEGATIVE
Ketones, UA: NEGATIVE
Leukocytes, UA: NEGATIVE
Nitrite, UA: NEGATIVE
Odor: NEGATIVE
Protein, UA: NEGATIVE
Spec Grav, UA: 1.025 (ref 1.010–1.025)
Urobilinogen, UA: 0.2 E.U./dL
pH, UA: 5 (ref 5.0–8.0)

## 2021-07-11 MED ORDER — METOPROLOL TARTRATE 25 MG PO TABS: 12.5000 mg | ORAL_TABLET | Freq: Two times a day (BID) | ORAL | 3 refills | Status: DC

## 2021-07-11 MED ORDER — ROSUVASTATIN CALCIUM 5 MG PO TABS: 5.0000 mg | ORAL_TABLET | Freq: Every day | ORAL | 3 refills | Status: DC

## 2021-07-11 NOTE — Progress Notes (Signed)
° ° ° °  Subjective:    Patient ID: Monico Blitz, MD, female    DOB: Oct 09, 1980, 41 y.o.   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   She has a history of papillary thyroid cancer and underwent right thyroid lobectomy by Dr. Harlow Asa in February 2020.  Now followed by Dr. Catheryn Bacon.  History of bilateral pulmonary emboli requiring admission to the hospital with acute dyspnea and hypoxemia in June 2019.  Remains on chronic anticoagulation.  This may have been related to a trip to [place] in March 2019 with prolonged airplane flight and hiking.  At 1 point she fell down and hurt her leg.  She is now on chronic Eliquis therapy.  She did see hematologist at [facility] who advised chronic anticoagulation.  Diagnosed with mononucleosis in March 2020 after a trip to the [place].  Remote history of fractured ankle, fractured wrist and knee surgery.  Social history: She is single.  Social alcohol consumption.  Non-smoker.  Family history: Parents and twin brothers in good health.  History of elevated LDL cholesterol and has been placed on Crestor 5 mg 3 times a week.  Apparently ran out recently and LDL was elevated at 146.  Needs to get back on this medication and have repeat lipid panel in a few weeks.  Would like for her to take it daily instead of 3 times weekly.  She is on high-dose vitamin D weekly.  Has Mirena IUD.  Have started her on low-dose Lopressor 12.5 mg twice daily.   Blood pressure seems to be elevated at work.  Beta-blocker is a good option if her pulse tends to run a bit high while she is working.  Discussion regarding weight loss.  BMI 45.77.  Not interested in gastric bypass surgery.    GYN is Dr. Edwinna Areola.  Patient had colposcopy in December with HPV 16 subtyping positive.  She has an IUD.  History of COVID-19 in February  2021.  Review of Systems blood pressure was noted to be borderline elevated in November Dr. Gearldine Shown office.     Objective:   Physical Exam Temperature 98.5 degrees pulse 91 pulse oximetry 98% weight 301 pounds BMI 45.77 Skin: Warm and dry.  No cervical adenopathy.  No thyromegaly.  No carotid bruits.  Chest is clear to auscultation without rales or wheezing.  Cardiac exam: Regular rate and rhythm without ectopy or murmur.  Abdomen is soft nondistended without hepatosplenomegaly masses or tenderness.  No lower extremity pitting edema.  Neurological exam is intact without gross focal deficits.  Affect thought and judgment are entirely normal.      Assessment & Plan:  Elevated blood pressure-treated with low-dose metoprolol twice daily and continue to monitor  Hyperlipidemia restart Crestor 5 mg daily instead of 3 times weekly and follow-up this summer.  BMI 45.77-not interested in gastric bypass surgery.  [facility] is not option.  M[facility] Parsons is an option and could be handled through endocrinology.  History of bilateral pulmonary emboli on chronic anticoagulation  History of papillary carcinoma of the thyroid status post surgical removal and now on thyroid replacement medication.  Plan: Would like to see her again around July for follow-up on hypertension and lipid management.

## 2021-07-12 LAB — LIPID PANEL
Cholesterol: 214 mg/dL — ABNORMAL HIGH (ref <200)
HDL: 52 mg/dL (ref >=50)
LDL Cholesterol (Calc): 146 mg/dL (calc) — ABNORMAL HIGH
Non-HDL Cholesterol (Calc): 162 mg/dL (calc) — ABNORMAL HIGH (ref <130)
Total CHOL/HDL Ratio: 4.1 (calc) (ref <5.0)
Triglycerides: 66 mg/dL (ref <150)

## 2021-07-12 LAB — CBC WITH DIFFERENTIAL/PLATELET
Absolute Monocytes: 400 cells/uL (ref 200–950)
Basophils Absolute: 29 cells/uL (ref 0–200)
Basophils Relative: 0.5 %
Eosinophils Absolute: 29 cells/uL (ref 15–500)
Eosinophils Relative: 0.5 %
HCT: 44 % (ref 35.0–45.0)
Hemoglobin: 14.9 g/dL (ref 11.7–15.5)
Lymphs Abs: 1282 cells/uL (ref 850–3900)
MCH: 32.5 pg (ref 27.0–33.0)
MCHC: 33.9 g/dL (ref 32.0–36.0)
MCV: 95.9 fL (ref 80.0–100.0)
MPV: 10.7 fL (ref 7.5–12.5)
Monocytes Relative: 6.9 %
Neutro Abs: 4060 cells/uL (ref 1500–7800)
Neutrophils Relative %: 70 %
Platelets: 246 10*3/uL (ref 140–400)
RBC: 4.59 10*6/uL (ref 3.80–5.10)
RDW: 11.8 % (ref 11.0–15.0)
Total Lymphocyte: 22.1 %
WBC: 5.8 10*3/uL (ref 3.8–10.8)

## 2021-07-12 LAB — TSH: TSH: 2.07 mIU/L

## 2021-07-12 LAB — COMPLETE METABOLIC PANEL WITH GFR
AG Ratio: 1.8 (calc) (ref 1.0–2.5)
ALT: 9 U/L (ref 6–29)
AST: 13 U/L (ref 10–30)
Albumin: 4.1 g/dL (ref 3.6–5.1)
Alkaline phosphatase (APISO): 49 U/L (ref 31–125)
BUN: 14 mg/dL (ref 7–25)
CO2: 23 mmol/L (ref 20–32)
Calcium: 9 mg/dL (ref 8.6–10.2)
Chloride: 108 mmol/L (ref 98–110)
Creat: 0.85 mg/dL (ref 0.50–0.99)
Globulin: 2.3 g/dL (calc) (ref 1.9–3.7)
Glucose, Bld: 108 mg/dL — ABNORMAL HIGH (ref 65–99)
Potassium: 4.4 mmol/L (ref 3.5–5.3)
Sodium: 138 mmol/L (ref 135–146)
Total Bilirubin: 0.7 mg/dL (ref 0.2–1.2)
Total Protein: 6.4 g/dL (ref 6.1–8.1)
eGFR: 88 mL/min/{1.73_m2} (ref >=60)

## 2021-07-12 LAB — VITAMIN D 25 HYDROXY (VIT D DEFICIENCY, FRACTURES): Vit D, 25-Hydroxy: 59 ng/mL (ref 30–100)

## 2021-07-12 LAB — HEMOGLOBIN A1C W/OUT EAG: Hgb A1c MFr Bld: 5.1 % of total Hgb (ref <5.7)

## 2021-07-26 ENCOUNTER — Ambulatory Visit (HOSPITAL_COMMUNITY)
Admission: RE | Admit: 2021-07-26 | Discharge: 2021-07-26 | Disposition: A | Discharge status: Home / Self Care | Payer: No Typology Code available for payment source | Source: Ambulatory Visit | Attending: Internal Medicine | Admitting: Internal Medicine

## 2021-07-26 ENCOUNTER — Encounter (HOSPITAL_COMMUNITY): Payer: Self-pay

## 2021-07-26 ENCOUNTER — Other Ambulatory Visit: Payer: Self-pay

## 2021-07-26 DIAGNOSIS — E7800 Pure hypercholesterolemia, unspecified: Secondary | ICD-10-CM | POA: Insufficient documentation

## 2021-08-12 ENCOUNTER — Encounter: Payer: Self-pay | Admitting: Internal Medicine

## 2021-08-12 NOTE — Patient Instructions (Signed)
Would like to see you again in 6 months regarding blood pressure and hyperlipidemia.  May take Crestor 5 mg daily and follow-up at that time with lipid panel.  Continue to monitor blood pressure on metoprolol.  It was a pleasure to see you today.

## 2021-10-31 ENCOUNTER — Ambulatory Visit: Payer: BC Managed Care – PPO | Admitting: Internal Medicine

## 2021-11-10 ENCOUNTER — Ambulatory Visit (INDEPENDENT_AMBULATORY_CARE_PROVIDER_SITE_OTHER): Payer: No Typology Code available for payment source | Admitting: Internal Medicine

## 2021-11-10 VITALS — BP 122/86 | HR 83 | Ht 68.0 in | Wt 305.2 lb

## 2021-11-10 DIAGNOSIS — C73 Malignant neoplasm of thyroid gland: Secondary | ICD-10-CM

## 2021-11-10 DIAGNOSIS — E890 Postprocedural hypothyroidism: Secondary | ICD-10-CM | POA: Diagnosis not present

## 2021-11-10 NOTE — Patient Instructions (Signed)
Please stop at the lab.  Please continue levothyroxine 75 mcg daily.  Take the thyroid hormone every day, with water, at least 30 minutes before breakfast, separated by at least 4 hours from: - acid reflux medications - calcium - iron - multivitamins  Please come back for a follow-up appointment in 1 year.

## 2021-11-10 NOTE — Progress Notes (Unsigned)
Patient ID: Penny Blitz, MD, female   DOB: Jan 17, 1981, 41 y.o.   MRN: [id number]   This visit occurred during the SARS-CoV-2 public health emergency.  Safety protocols were in place, including screening questions prior to the visit, additional usage of staff PPE, and extensive cleaning of exam room while observing appropriate contact time as indicated for disinfecting solutions.   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 Harlow Asa, returning for follow-up for thyroid cancer and postsurgical hypothyroidism.  Last visit 1 year ago.  Interim history: She continues to have difficulty sleeping and cold sweats. Since the beginning of the year, she had several episodes of anxiety >> during these, stopped LT4 for few days >> resolved >> restarted LT4.  Started on metoprolol approximately 6 months ago.  Reviewed history: Pt. has been dx with papillary thyroid cancer in 07/2018.   Patient was found to have a right thyroid nodule in [employer] (>1 cm).  FNA (2003): colloid  Chest CT angiogram (11/18/2017) performed during investigation for PE (which was positive): Approximately 1.5 cm low-attenuation nodule within the inferior aspect of the right thyroid gland with peripheral dystrophic calcifications. No evidence of mediastinal or hilar adenopathy.  She then had a thyroid ultrasound (04/09/2018): Location: Right; Inferior Maximum size: 1.8 x 1.7 x 1.5 cm Composition: solid/almost completely solid (2) Echogenicity: isoechoic (1) Echogenic foci: peripheral calcifications (2)  FNA of this nodule (05/08/2018): THYROID, FINE NEEDLE ASPIRATION, RLP (SPECIMEN 1 OF 1, COLLECTED 05/08/18): SUSPICIOUS FOR MALIGNANCY, SEE COMMENT ([place]ES[place] CATEGORY V).  She had right lobectomy + isthmusectomy (07/24/2018) by Dr. Harlow Asa: Thyroid, lobectomy, right and isthmus - PAPILLARY CARCINOMA, 1.8 CM IN GREATEST DIMENSION. - NO EXTRATHYROIDAL EXTENSION  IDENTIFIED. - MARGINS OF RESECTION ARE NOT INVOLVED. - ONE LYMPH NODE, NEGATIVE FOR CARCINOMA (0/1). - SEE ONCOLOGY TABLE. Microscopic Comment THYROID GLAND: Procedure: Right lobectomy and isthmusectomy Tumor Focality: Unifocal Tumor Site: Right lobe Tumor Size: Greatest dimension: 1.8 cm Histologic Type: Papillary carcinoma, classic (usual, conventional) Margins: Uninvolved by carcinoma Angioinvasion: Not identified Lymphatic Invasion: Not identified Extrathyroidal Extension: Not identified Regional Lymph Nodes: Number of Lymph Nodes Involved: 0 Nodal Levels Involved: Not applicable Size of Largest Metastatic Deposit: Not applicable Extranodal Extension (ENE): Not applicable Number of Lymph Nodes Examined: 1 Nodal Levels Examined: Incidental. The lymph node is present in the soft tissue surrounding the thyroid parenchyma. Pathologic Stage Classification (pTNM, AJCC 8th Edition): pT1b, pN0 Comment: Intradepartmental consultation was obtained (Dr. Tresa Moore). r Thyroid U/S (11/03/2019): Parenchymal Echotexture: Moderately heterogenous Isthmus: 0.2 cm Right lobe: Surgically absent Left lobe: 4.3 x 1.1 x 1.6 cm  ____________________________________________________________________   Surgical changes of right hemithyroidectomy. No residual or recurrent thyroid tissue or nodularity in the thyroid resection bed. The residual isthmus and left thyroid gland are heterogeneous in appearance. Small subcentimeter cysts and hypoechoic nodules noted incidentally on the left. No suspicious features. These nodules do not meet criteria for biopsy or further imaging evaluation.   IMPRESSION: 1. Surgical changes of prior right hemithyroidectomy without evidence of residual or recurrent thyroid tissue. 2. Heterogeneous residual thyroid isthmus and left thyroid gland containing small cysts and nodules which do not meet criteria for further evaluation.oss  and Lab Results  Component Value Date    THYROGLB 17.7 11/18/2018   Lab Results  Component Value Date   THGAB 1 11/18/2018  cal Information Postsurgical hypothyroidism  Pt is on levothyroxine 75 mcg daily, taken: - in am - fasting - at least 30-60 min from  coffee and cream, breakfast - no Ca, Fe, MVI, PPIs - On biotin 5000 mcg daily - not recently On Vitamin D and occasionally Tums.  Reviewed her TFTs: Lab Results  Component Value Date   TSH 2.07 07/10/2021   TSH 1.45 10/25/2020   TSH 1.85 10/20/2019   TSH 1.73 04/30/2019   TSH 1.55 11/18/2018   TSH 2.026 11/19/2017   FREET4 1.17 10/25/2020   FREET4 1.20 11/18/2018  09/2018: TSH ~1 08/2018: TSH ~4  Pt denies: - feeling nodules in neck - hoarseness - dysphagia - choking - SOB with lying down  She has + FH of thyroid disorders in: mother, PGM. No FH of thyroid cancer. No h/o radiation tx to head or neck. No herbal supplements. No Biotin use. No recent steroids use.   She is on the Mirena IUD since 2020.  ROS: + See HPI  I reviewed pt's medications, allergies, PMH, social hx, family hx, and changes were documented in the history of present illness. Otherwise, unchanged from my initial visit note.  Past Medical History:  Diagnosis Date   GERD (gastroesophageal reflux disease)    resolved    PONV (postoperative nausea and vomiting)    mild nausea responsive to meds    Pulmonary embolism ([place]) 10/2017   Thyroid carcinoma ([facility])    Past Surgical History:  Procedure Laterality Date   broken wrist     KNEE SURGERY     arthroscopy    THYROIDECTOMY Right 07/24/2018   Procedure: RIGHT THYROID LOBECTOMY  AND ISTHMUS;  Surgeon: Armandina Gemma, MD;  Location: [facility];  Service: General;  Laterality: Right;   Social History   Socioeconomic History   Marital status: Single    Spouse name: Not on file   Number of children: Not on file   Years of education: Not on file   Highest education level: Not on file  Occupational History   Not on file  Tobacco Use    Smoking status: Never   Smokeless tobacco: Never  Vaping Use   Vaping Use: Never used  Substance and Sexual Activity   Alcohol use: Yes    Comment: occasional   Drug use: Never   Sexual activity: Yes    Birth control/protection: I.U.D.    Comment: Mirena placed 11/04/18  Other Topics Concern   Not on file  Social History Narrative   Social history: She is single.  Social alcohol consumption.  Has steady female partner.  Non-smoker.       Family history: Parents and twin brothers in good health   Social Determinants of Health   Financial Resource Strain: Not on file  Food Insecurity: Not on file  Transportation Needs: Not on file  Physical Activity: Not on file  Stress: Not on file  Social Connections: Not on file  Intimate Partner Violence: Not on file   Current Outpatient Medications on File Prior to Visit  Medication Sig Dispense Refill   apixaban (ELIQUIS) 5 MG TABS tablet Take 1 tablet (5 mg total) by mouth 2 (two) times daily. 180 tablet 3   levonorgestrel (MIRENA) 20 MCG/24HR IUD 1 each by Intrauterine route once. Placed 11/14/18     levothyroxine (SYNTHROID) 75 MCG tablet TAKE ONE TABLET BY MOUTH EVERY MORNING 90 tablet 3   metoprolol tartrate (LOPRESSOR) 25 MG tablet Take 0.5 tablets (12.5 mg total) by mouth 2 (two) times daily. 90 tablet 3   rosuvastatin (CRESTOR) 5 MG tablet Take 1 tablet (5 mg total) by mouth daily. [place]  tablet 3   Vitamin D, Ergocalciferol, (DRISDOL) 1.25 MG (50000 UNIT) CAPS capsule TAKE ONE CAPSULE BY MOUTH ONCE WEEKLY 12 capsule 3   No current facility-administered medications on file prior to visit.   No Known Allergies Family History  Problem Relation Age of Onset   Breast cancer Maternal Aunt 43   Heart disease Paternal Grandmother    Heart disease Paternal Grandfather     PE: BP 122/86 (BP Location: Left Arm, Patient Position: Sitting, Cuff Size: Normal)   Pulse 83   Ht '5\' 8"'$  (1.727 m)   Wt (!) 305 lb 3.2 oz (138.4 kg)   SpO2 96%    BMI 46.41 kg/m  Wt Readings from Last 3 Encounters:  11/10/21 (!) 305 lb 3.2 oz (138.4 kg)  07/11/21 (!) 301 lb (136.5 kg)  05/22/21 296 lb 12.8 oz (134.6 kg)   Constitutional: overweight, in NAD Eyes: PERRLA, EOMI, no exophthalmos ENT: moist mucous membranes, no thyroid masses palpated, thyroidectomy scar healed, no cervical lymphadenopathy Cardiovascular: RRR, No MRG Respiratory: CTA B Musculoskeletal: no deformities, strength intact in all 4 Skin: moist, warm, no rashes Neurological: no tremor with outstretched hands, DTR normal in all 4  ASSESSMENT: 1. Thyroid cancer - see HPI  2. Postsurgical Hypothyroidism  PLAN:  1. Thyroid cancer -papillary -Patient with thyroid cancer stage I TNM by pathology -this is a slow-growing cancer, with good prognosis -The tumor was larger than 1.5 cm, however, it did not have aggressive features, so I did not recommend completion thyroidectomy or RAI treatment -We checked a thyroid ultrasound in 10/2019 and this did not show any concerning masses.  She had heterogeneous residual thyroid isthmus and left thyroid gland left in place, which appeared low risk and no further investigation was recommended -Thyroglobulin levels are not checked anymore in the setting of thyroidectomy due to the fact that they are too fluctuating and unreliable -We will continue to follow her clinically and with neck ultrasounds as needed -At today's visit, she has no neck compression symptoms and I cannot feel any masses in her neck -We discussed about not stopping the levothyroxine anymore (which she has been doing  to help with anxiety) as this can stimulate any remnant cancerous cell to grow -plan to check another ultrasound at next visit  2. Patient with history of partial thyroidectomy for thyroid cancer, now with iatrogenic hypothyroidism, on levothyroxine therapy. - latest thyroid labs reviewed with pt. >> normal: Lab Results  Component Value Date   TSH 2.07  07/10/2021  - she continues on LT4 75 mcg daily - we discussed about the importance of not missing doses anymore.  Her anxiety episodes may not be related to her thyroid medication, but the hypothyroidism that is used, may have helped.  I explained that this does not mean that the levothyroxine tablets are the cause of her anxiety.  However, it is possible that some of the excipients in the tablets may contribute and we discussed about switching to white tablets after the results return.  She agrees with this plan.  Another alternative is to switch to Tirosint (levothyroxine), but discussed that this is expensive and may not be covered by her insurance. - pt feels good on this dose.  She gained 8 pounds since last visit. - we discussed about taking the thyroid hormone every day, with water, >30 minutes before breakfast, separated by >4 hours from acid reflux medications, calcium, iron, multivitamins. Pt. is taking it correctly. - will check thyroid tests today: TSH and  fT4 - If labs are abnormal, she will need to return for repeat TFTs in 1.5 months  Needs white tabs.  Philemon Kingdom, MD PhD [facility] Endocrinology

## 2021-11-11 LAB — TSH: TSH: 2.2 u[IU]/mL (ref 0.450–4.500)

## 2021-11-11 LAB — T4, FREE: Free T4: 1.54 ng/dL (ref 0.82–1.77)

## 2021-11-14 ENCOUNTER — Other Ambulatory Visit (HOSPITAL_BASED_OUTPATIENT_CLINIC_OR_DEPARTMENT_OTHER): Payer: Self-pay

## 2021-11-14 ENCOUNTER — Encounter: Payer: Self-pay | Admitting: Internal Medicine

## 2021-11-14 MED ORDER — LEVOTHYROXINE SODIUM 50 MCG PO TABS
ORAL_TABLET | ORAL | 3 refills | Status: DC
  Filled 2021-11-14: qty 90, 50d supply, fill #0
  Filled 2022-01-23: qty 90, 50d supply, fill #1

## 2021-11-16 DIAGNOSIS — Z0289 Encounter for other administrative examinations: Secondary | ICD-10-CM

## 2021-11-23 ENCOUNTER — Encounter: Payer: Self-pay | Admitting: Internal Medicine

## 2021-11-23 ENCOUNTER — Telehealth: Payer: Self-pay

## 2021-11-23 ENCOUNTER — Other Ambulatory Visit (HOSPITAL_BASED_OUTPATIENT_CLINIC_OR_DEPARTMENT_OTHER): Payer: Self-pay

## 2021-11-23 ENCOUNTER — Ambulatory Visit (INDEPENDENT_AMBULATORY_CARE_PROVIDER_SITE_OTHER): Payer: No Typology Code available for payment source | Admitting: Internal Medicine

## 2021-11-23 VITALS — BP 112/82 | HR 77 | Temp 99.0°F

## 2021-11-23 DIAGNOSIS — Z1383 Encounter for screening for respiratory disorder NEC: Secondary | ICD-10-CM

## 2021-11-23 DIAGNOSIS — Z7901 Long term (current) use of anticoagulants: Secondary | ICD-10-CM

## 2021-11-23 DIAGNOSIS — R059 Cough, unspecified: Secondary | ICD-10-CM | POA: Diagnosis not present

## 2021-11-23 DIAGNOSIS — J029 Acute pharyngitis, unspecified: Secondary | ICD-10-CM

## 2021-11-23 DIAGNOSIS — Z85850 Personal history of malignant neoplasm of thyroid: Secondary | ICD-10-CM

## 2021-11-23 DIAGNOSIS — J22 Unspecified acute lower respiratory infection: Secondary | ICD-10-CM

## 2021-11-23 DIAGNOSIS — Z86711 Personal history of pulmonary embolism: Secondary | ICD-10-CM

## 2021-11-23 DIAGNOSIS — H6693 Otitis media, unspecified, bilateral: Secondary | ICD-10-CM

## 2021-11-23 LAB — POCT RAPID STREP A (OFFICE): Rapid Strep A Screen: NEGATIVE

## 2021-11-23 MED ORDER — HYDROCODONE BIT-HOMATROP MBR 5-1.5 MG/5ML PO SOLN
5.0000 mL | Freq: Three times a day (TID) | ORAL | 0 refills | Status: DC | PRN
  Filled 2021-11-23: qty 120, 7d supply, fill #0

## 2021-11-23 MED ORDER — CEFTRIAXONE SODIUM 1 G IJ SOLR
1.0000 g | Freq: Once | INTRAMUSCULAR | Status: AC
  Administered 2021-11-23: 1 g via INTRAMUSCULAR

## 2021-11-23 MED ORDER — LEVOFLOXACIN 500 MG PO TABS
500.0000 mg | ORAL_TABLET | Freq: Every day | ORAL | 0 refills | Status: DC
  Filled 2021-11-23: qty 10, 10d supply, fill #0

## 2021-11-23 NOTE — Progress Notes (Signed)
   Subjective:    Patient ID: Penny Blitz, MD, female    DOB: April 10, 1981, 41 y.o.   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  She called today with symptoms onset earlier this week  of productive cough, diaphoresis, respiratory congestion and ear pain.  She took a home COVID test today which was negative.  She has a history of bilateral pulmonary emboli requiring admission to the hospital with acute dyspnea and hypoxemia in June 2019 and remains on chronic anticoagulation.  History of papillary thyroid cancer and underwent right thyroid lobectomy by Dr. Harlow Asa in February 2020 and is followed by Dr. Letta Median, Endocrinologist.    Review of Systems no vomiting but has malaise and fatigue.     Objective:   Physical Exam Temperature 99 degrees by ear thermometer pulse oximetry 98% pulse 77 blood pressure 112/82.  She looks fatigued. Skin warm and dry.  No cervical adenopathy.  TMs are full bilaterally.  Neck is supple.  Chest is clear to auscultation without rales or wheezing.  Rapid strep screen is negative.  Respiratory virus panel collected.  COVID PCR test obtained.      Assessment & Plan:  Acute respiratory infection-I do not think she has pneumonia at the present time.  She looks fatigued.  She has upper respiratory congestion and sore throat.  Rapid strep screen is negative.  Acute otitis media  Acute pharyngitis-none strep  History of bilateral pulmonary embolism on chronic anticoagulation therapy and followed by Dr. Levora Angel and fatigue  History of papillary carcinoma of the thyroid followed by Endocrinology status post surgery  Plan: 1 g IM Rocephin.  Levaquin 500 mg daily for 10 days.  Take with food.  Hycodan 1 teaspoon every 8 hours as needed for cough.  Rest and drink fluids.  Fortunately she does not have to go back to work this weekend.  Call if not improving within 48  hours or sooner if worse.

## 2021-11-23 NOTE — Telephone Encounter (Signed)
Patient called with cough, congestion, night sweats and ear pain. She asked for appt. She has been advised to take a covid test and call back with results for appointment.

## 2021-11-23 NOTE — Patient Instructions (Signed)
Rest at home, stay well-hydrated, monitor pulse oximetry.  Walk some to prevent atelectasis.  COVID-19 PCR test and respiratory virus panel are pending.  Rapid strep screen is negative.  Received 1 g IM Rocephin in office and will take Levaquin 500 mg daily for 10 days.  May take Hycodan 1 teaspoon every 8 hours as needed for cough.  Call if not improving within 48 hours or sooner if worse.

## 2021-11-27 ENCOUNTER — Ambulatory Visit (INDEPENDENT_AMBULATORY_CARE_PROVIDER_SITE_OTHER): Payer: No Typology Code available for payment source | Admitting: Obstetrics & Gynecology

## 2021-11-27 ENCOUNTER — Other Ambulatory Visit (HOSPITAL_COMMUNITY)
Admission: RE | Admit: 2021-11-27 | Discharge: 2021-11-27 | Disposition: A | Discharge status: Home / Self Care | Payer: No Typology Code available for payment source | Source: Ambulatory Visit | Attending: Obstetrics & Gynecology | Admitting: Obstetrics & Gynecology

## 2021-11-27 ENCOUNTER — Encounter (HOSPITAL_BASED_OUTPATIENT_CLINIC_OR_DEPARTMENT_OTHER): Payer: Self-pay | Admitting: Obstetrics & Gynecology

## 2021-11-27 VITALS — BP 130/94 | HR 74 | Wt 303.0 lb

## 2021-11-27 DIAGNOSIS — R87610 Atypical squamous cells of undetermined significance on cytologic smear of cervix (ASC-US): Secondary | ICD-10-CM

## 2021-11-27 DIAGNOSIS — B977 Papillomavirus as the cause of diseases classified elsewhere: Secondary | ICD-10-CM

## 2021-11-27 DIAGNOSIS — Z86711 Personal history of pulmonary embolism: Secondary | ICD-10-CM

## 2021-11-28 DIAGNOSIS — Z86711 Personal history of pulmonary embolism: Secondary | ICD-10-CM | POA: Insufficient documentation

## 2021-11-28 LAB — SARS-COV-2 RNA (COVID-19) RESP VIRAL PNL QL NAAT
Adenovirus B: NOT DETECTED
HUMAN PARAINFLU VIRUS 1: NOT DETECTED
HUMAN PARAINFLU VIRUS 2: NOT DETECTED
HUMAN PARAINFLU VIRUS 3: NOT DETECTED
INFLUENZA A SUBTYPE H1: DETECTED — AB
INFLUENZA A SUBTYPE H3: NOT DETECTED
Influenza A: DETECTED — AB
Influenza B: NOT DETECTED
Metapneumovirus: NOT DETECTED
Respiratory Syncytial Virus A: NOT DETECTED
Respiratory Syncytial Virus B: NOT DETECTED
Rhinovirus: NOT DETECTED
SARS CoV2 RNA: NOT DETECTED

## 2021-11-28 NOTE — Progress Notes (Signed)
GYNECOLOGY  VISIT  CC:   follow up pap smear  HPI: 41 y.o. G0P0000 Single White or Caucasian female here for repeat pap and HR HPV due to hx of ASCUS pap with +HR HPV obtained 03/08/2021.  Colposcopy done 05/22/2021 was negative for visible abnormal findings.  ECC was negative.  As there was no lesion noted, repeat pap smear recommended.  She is here for this today.  Has IUD.  Getting married in October.   Past Medical History:  Diagnosis Date   GERD (gastroesophageal reflux disease)    resolved    PONV (postoperative nausea and vomiting)    mild nausea responsive to meds    Pulmonary embolism ([place]) 10/2017   Thyroid carcinoma ([place])     MEDS:   Current Outpatient Medications on File Prior to Visit  Medication Sig Dispense Refill   apixaban (ELIQUIS) 5 MG TABS tablet Take 1 tablet (5 mg total) by mouth 2 (two) times daily. 180 tablet 3   HYDROcodone bit-homatropine (HYCODAN) 5-1.5 MG/5ML syrup Take 5 mLs by mouth every 8 (eight) hours as needed for cough. 120 mL 0   levofloxacin (LEVAQUIN) 500 MG tablet Take 1 tablet (500 mg total) by mouth daily. 10 tablet 0   levonorgestrel (MIRENA) 20 MCG/24HR IUD 1 each by Intrauterine route once. Placed 11/14/18     levothyroxine (SYNTHROID) 50 MCG tablet Take by mouth 1.5 tablets alternating with 2 tablets every other day 90 tablet 3   metoprolol tartrate (LOPRESSOR) 25 MG tablet Take 0.5 tablets (12.5 mg total) by mouth 2 (two) times daily. 90 tablet 3   rosuvastatin (CRESTOR) 5 MG tablet Take 1 tablet (5 mg total) by mouth daily. 90 tablet 3   Vitamin D, Ergocalciferol, (DRISDOL) 1.25 MG (50000 UNIT) CAPS capsule TAKE ONE CAPSULE BY MOUTH ONCE WEEKLY 12 capsule 3   No current facility-administered medications on file prior to visit.    ALLERGIES: Patient has no known allergies.  SH:  engaged, non smoker  Review of Systems  Genitourinary: Negative.     PHYSICAL EXAMINATION:    BP (!) 130/94 (BP Location: Right Arm, Patient Position:  Standing, Cuff Size: Large)   Pulse 74   Wt (!) 303 lb (137.4 kg)   BMI 46.07 kg/m     General appearance: alert, cooperative and appears stated age Lymph:  no inguinal LAD noted  Pelvic: External genitalia:  no lesions              Urethra:  normal appearing urethra with no masses, tenderness or lesions              Bartholins and Skenes: normal                 Vagina: normal appearing vagina with normal color and discharge, no lesions              Cervix: no lesions  Chaperone, Octaviano Batty, CMA, was present for exam.  Assessment/Plan: 1. High risk HPV infection - Cytology - PAP( [place])  2. Atypical squamous cells of undetermined significance on cytologic smear of cervix (ASC-US)

## 2021-11-29 ENCOUNTER — Other Ambulatory Visit (HOSPITAL_BASED_OUTPATIENT_CLINIC_OR_DEPARTMENT_OTHER): Payer: Self-pay

## 2021-11-29 ENCOUNTER — Encounter: Payer: Self-pay | Admitting: Internal Medicine

## 2021-11-29 ENCOUNTER — Telehealth: Payer: Self-pay | Admitting: Internal Medicine

## 2021-11-29 DIAGNOSIS — J101 Influenza due to other identified influenza virus with other respiratory manifestations: Secondary | ICD-10-CM

## 2021-11-29 MED ORDER — METHYLPREDNISOLONE 4 MG PO TABS: ORAL_TABLET | ORAL | 0 refills | Status: DC

## 2021-11-29 MED ORDER — BENZONATATE 100 MG PO CAPS: 100.0000 mg | ORAL_CAPSULE | Freq: Three times a day (TID) | ORAL | 1 refills | Status: DC | PRN

## 2021-11-29 NOTE — Telephone Encounter (Signed)
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 I called to check on her. She says airways feel a bit tight.. We spoke by phone today and have decided on a course of  oral steroids and some Tessalon perles for cough.

## 2021-12-01 LAB — CYTOLOGY - PAP
Adequacy: ABSENT
Comment: NEGATIVE
Diagnosis: NEGATIVE
High risk HPV: NEGATIVE

## 2021-12-23 ENCOUNTER — Encounter (INDEPENDENT_AMBULATORY_CARE_PROVIDER_SITE_OTHER): Payer: Self-pay

## 2021-12-26 ENCOUNTER — Other Ambulatory Visit: Payer: BC Managed Care – PPO

## 2021-12-26 DIAGNOSIS — E7800 Pure hypercholesterolemia, unspecified: Secondary | ICD-10-CM | POA: Diagnosis not present

## 2021-12-26 LAB — LIPID PANEL
Cholesterol: 140 mg/dL (ref <200)
HDL: 54 mg/dL (ref >=50)
LDL Cholesterol (Calc): 72 mg/dL (calc)
Non-HDL Cholesterol (Calc): 86 mg/dL (calc) (ref <130)
Total CHOL/HDL Ratio: 2.6 (calc) (ref <5.0)
Triglycerides: 63 mg/dL (ref <150)

## 2022-01-02 ENCOUNTER — Ambulatory Visit: Payer: BC Managed Care – PPO | Admitting: Internal Medicine

## 2022-01-02 ENCOUNTER — Encounter: Payer: Self-pay | Admitting: Internal Medicine

## 2022-01-02 VITALS — BP 112/78 | HR 79 | Temp 98.5°F | Wt 305.5 lb

## 2022-01-02 DIAGNOSIS — Z85850 Personal history of malignant neoplasm of thyroid: Secondary | ICD-10-CM

## 2022-01-02 DIAGNOSIS — Z86711 Personal history of pulmonary embolism: Secondary | ICD-10-CM | POA: Diagnosis not present

## 2022-01-02 DIAGNOSIS — Z7901 Long term (current) use of anticoagulants: Secondary | ICD-10-CM

## 2022-01-02 DIAGNOSIS — I10 Essential (primary) hypertension: Secondary | ICD-10-CM | POA: Diagnosis not present

## 2022-01-02 DIAGNOSIS — Z6841 Body Mass Index (BMI) 40.0 and over, adult: Secondary | ICD-10-CM

## 2022-01-02 DIAGNOSIS — E7800 Pure hypercholesterolemia, unspecified: Secondary | ICD-10-CM

## 2022-01-02 NOTE — Progress Notes (Signed)
   Subjective:    Patient ID: Penny Blitz, MD, female    DOB: 04/10/1981, 41 y.o.   MRN: [id number]  HPI 41 year old seen today regarding recent elevation of blood pressure.  She was informed recently that she will be seen soon at [facility].  She is planning to be married this Fall.  She has a history of papillary thyroid cancer and underwent right thyroid lobectomy by Dr. Harlow Asa in February 2020.  She is followed by Dr. Letta Median, Endocrinologist.  She is on thyroid replacement medication.  Recently she flew to [facility] for medical conference and Came down with a viral illness.  Was diagnosed here with Influenza A upon return.  Had Mononucleosis in March 2020 after a trip to the [place].  Remote history of fractured ankle, fractured wrist and knee surgery.  Her GYN is Edwinna Areola.  She had COVID-19 in February 2021.  History of bilateral pulmonary emboli and is on chronic anticoagulation.  Recent lipid panel is excellent on low-dose Crestor.  She had coronary calcium scoring in February 2023 and score was 0.    Review of Systems see above no new complaints     Objective:   Physical Exam  Blood pressure is excellent today at 112/78, pulse 79 regular, Temperature 98.5 degrees ,pulse oximetry 98%, weight 305 pounds 8 ounces, BMI 46.45  She looks rested and relaxed today and in no acute distress.  She is clear.  Cardiac exam regular rate and rhythm.     Assessment & Plan:  Essential hypertension stable on metoprolol.  Currently on 12.5 mg twice daily.  Pure hypercholesterolemia-Coronary calcium score of 0.  Lipid panel is normal.  Continue low-dose Crestor 5 mg daily  History of bilateral pulmonary emboli-on chronic anticoagulation with Eliquis 5 mg twice daily.  History of COVID-19 2021  History of right thyroid lobectomy due to papillary carcinoma and is followed by Dr. Letta Median  She is on thyroid replacement medication.

## 2022-01-02 NOTE — Patient Instructions (Signed)
It was a pleasure to see you today.  Congratulations on your upcoming marriage.  Continue with chronic anticoagulation with Eliquis continue metoprolol at current dose 12.5 mg twice daily.  Coronary calcium score is excellent at 0.  Lipid panel is normal.  Please continue low-dose

## 2022-01-03 ENCOUNTER — Ambulatory Visit (INDEPENDENT_AMBULATORY_CARE_PROVIDER_SITE_OTHER): Payer: No Typology Code available for payment source | Admitting: Bariatrics

## 2022-01-09 ENCOUNTER — Encounter (INDEPENDENT_AMBULATORY_CARE_PROVIDER_SITE_OTHER): Payer: Self-pay | Admitting: Bariatrics

## 2022-01-09 ENCOUNTER — Ambulatory Visit (INDEPENDENT_AMBULATORY_CARE_PROVIDER_SITE_OTHER): Payer: BC Managed Care – PPO | Admitting: Bariatrics

## 2022-01-09 VITALS — BP 119/77 | HR 82 | Temp 98.1°F | Ht 67.0 in | Wt 300.0 lb

## 2022-01-09 DIAGNOSIS — Z1331 Encounter for screening for depression: Secondary | ICD-10-CM | POA: Diagnosis not present

## 2022-01-09 DIAGNOSIS — I10 Essential (primary) hypertension: Secondary | ICD-10-CM

## 2022-01-09 DIAGNOSIS — R0602 Shortness of breath: Secondary | ICD-10-CM | POA: Diagnosis not present

## 2022-01-09 DIAGNOSIS — Z6841 Body Mass Index (BMI) 40.0 and over, adult: Secondary | ICD-10-CM

## 2022-01-09 DIAGNOSIS — E038 Other specified hypothyroidism: Secondary | ICD-10-CM

## 2022-01-09 DIAGNOSIS — Z8639 Personal history of other endocrine, nutritional and metabolic disease: Secondary | ICD-10-CM | POA: Diagnosis not present

## 2022-01-09 DIAGNOSIS — Z0000 Encounter for general adult medical examination without abnormal findings: Secondary | ICD-10-CM

## 2022-01-09 DIAGNOSIS — E7800 Pure hypercholesterolemia, unspecified: Secondary | ICD-10-CM | POA: Diagnosis not present

## 2022-01-09 DIAGNOSIS — R5383 Other fatigue: Secondary | ICD-10-CM

## 2022-01-09 DIAGNOSIS — E66813 Obesity, class 3: Secondary | ICD-10-CM

## 2022-01-10 LAB — INSULIN, RANDOM: INSULIN: 8 u[IU]/mL (ref 2.6–24.9)

## 2022-01-10 LAB — COMPREHENSIVE METABOLIC PANEL WITH GFR
ALT: 10 IU/L (ref 0–32)
AST: 15 IU/L (ref 0–40)
Albumin/Globulin Ratio: 1.9 (ref 1.2–2.2)
Albumin: 4.2 g/dL (ref 3.9–4.9)
Alkaline Phosphatase: 63 IU/L (ref 44–121)
BUN/Creatinine Ratio: 23 (ref 9–23)
BUN: 18 mg/dL (ref 6–24)
Bilirubin Total: 0.5 mg/dL (ref 0.0–1.2)
CO2: 20 mmol/L (ref 20–29)
Calcium: 9.1 mg/dL (ref 8.7–10.2)
Chloride: 103 mmol/L (ref 96–106)
Creatinine, Ser: 0.8 mg/dL (ref 0.57–1.00)
Globulin, Total: 2.2 g/dL (ref 1.5–4.5)
Glucose: 87 mg/dL (ref 70–99)
Potassium: 4.8 mmol/L (ref 3.5–5.2)
Sodium: 139 mmol/L (ref 134–144)
Total Protein: 6.4 g/dL (ref 6.0–8.5)
eGFR: 94 mL/min/1.73

## 2022-01-10 LAB — TSH+T4F+T3FREE
Free T4: 1.56 ng/dL (ref 0.82–1.77)
T3, Free: 2.8 pg/mL (ref 2.0–4.4)
TSH: 1.71 u[IU]/mL (ref 0.450–4.500)

## 2022-01-10 LAB — VITAMIN D 25 HYDROXY (VIT D DEFICIENCY, FRACTURES): Vit D, 25-Hydroxy: 53.3 ng/mL (ref 30.0–100.0)

## 2022-01-11 ENCOUNTER — Encounter: Payer: Self-pay | Admitting: Internal Medicine

## 2022-01-12 ENCOUNTER — Other Ambulatory Visit: Payer: Self-pay | Admitting: Internal Medicine

## 2022-01-17 ENCOUNTER — Ambulatory Visit (INDEPENDENT_AMBULATORY_CARE_PROVIDER_SITE_OTHER): Payer: No Typology Code available for payment source | Admitting: Bariatrics

## 2022-01-23 ENCOUNTER — Other Ambulatory Visit (HOSPITAL_BASED_OUTPATIENT_CLINIC_OR_DEPARTMENT_OTHER): Payer: Self-pay

## 2022-01-23 ENCOUNTER — Encounter (INDEPENDENT_AMBULATORY_CARE_PROVIDER_SITE_OTHER): Payer: Self-pay | Admitting: Bariatrics

## 2022-01-23 ENCOUNTER — Ambulatory Visit (INDEPENDENT_AMBULATORY_CARE_PROVIDER_SITE_OTHER): Payer: BC Managed Care – PPO | Admitting: Bariatrics

## 2022-01-23 VITALS — BP 125/80 | HR 63 | Temp 98.0°F | Ht 67.0 in | Wt 299.0 lb

## 2022-01-23 DIAGNOSIS — Z6841 Body Mass Index (BMI) 40.0 and over, adult: Secondary | ICD-10-CM | POA: Diagnosis not present

## 2022-01-23 DIAGNOSIS — F5089 Other specified eating disorder: Secondary | ICD-10-CM | POA: Diagnosis not present

## 2022-01-23 DIAGNOSIS — E559 Vitamin D deficiency, unspecified: Secondary | ICD-10-CM | POA: Diagnosis not present

## 2022-01-23 DIAGNOSIS — E669 Obesity, unspecified: Secondary | ICD-10-CM

## 2022-01-23 MED ORDER — BUPROPION HCL ER (SR) 150 MG PO TB12: 150.0000 mg | ORAL_TABLET | Freq: Every day | ORAL | 0 refills | Status: DC

## 2022-01-23 NOTE — Progress Notes (Unsigned)
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 female who presents for evaluation and treatment of obesity and related comorbidities. Current BMI is Body mass index is 46.99 kg/m. Penny Hancock has been struggling with her weight for many years and has been unsuccessful in either losing weight, maintaining weight loss, or reaching her healthy weight goal.  Penny Hancock is currently in the action stage of change and ready to dedicate time achieving and maintaining a healthier weight. Penny Hancock is interested in becoming our patient and working on intensive lifestyle modifications including (but not limited to) diet and exercise for weight loss.  Penny Hancock's habits were reviewed today and are as follows: her desired weight loss is 50-75 lbs, she has been heavy most of her life, she started gaining weight in 6th grade, her heaviest weight ever was 340 pounds, she has significant food cravings issues, she snacks frequently in the evenings, she skips meals frequently, she is frequently drinking liquids with calories, she has problems with excessive hunger, she frequently eats larger portions than normal, and she struggles with emotional eating.  Depression Screen Penny Hancock's Food and Mood (modified PHQ-9) score was 13.     01/09/2022    7:18 AM  Depression screen PHQ 2/9  Decreased Interest 2  Down, Depressed, Hopeless 1  PHQ - 2 Score 3  Altered sleeping 2  Tired, decreased energy 3  Change in appetite 3  Feeling bad or failure about yourself  2  Trouble concentrating 0  Moving slowly or fidgety/restless 0  Suicidal thoughts 0  PHQ-9 Score 13  Difficult doing work/chores Not difficult at all   Subjective:   1. Other fatigue Penny Hancock admits to daytime somnolence and denies waking up still tired. Patient has a history of symptoms of daytime fatigue. Penny Hancock generally gets 7 hours of sleep per night, and states that she has difficulty falling asleep and generally restful  sleep. Snoring is present. Apneic episodes are not present. Epworth Sleepiness Score is 2.   2. SOB (shortness of breath) on exertion Penny Hancock notes increasing shortness of breath with exercising and seems to be worsening over time with weight gain. She notes getting out of breath sooner with activity than she used to. This has not gotten worse recently. Penny Hancock denies shortness of breath at rest or orthopnea.  3. Essential hypertension Penny Hancock is taking metoprolol, and her blood pressure is controlled.  4. Pure hypercholesterolemia Penny Hancock is taking Crestor currently.  5. Elevated serum glucose Penny Hancock is not on medications.  Last glucose was 108 and A1c 5.1.  6. History of vitamin D deficiency Penny Hancock is taking vitamin D currently.  7. Health care maintenance Obesity with comorbidity.  8. Other specified hypothyroidism Penny Hancock is taking Synthroid.  She has a history of thyroid cancer.  Assessment/Plan:   1. Other fatigue Penny Hancock does feel that her weight is causing her energy to be lower than it should be. Fatigue may be related to obesity, depression or many other causes. Labs will be ordered, and in the meanwhile, Penny Hancock will focus on self care including making healthy food choices, increasing physical activity and focusing on stress reduction.  - EKG 12-Lead - Comprehensive metabolic panel - WCH+E5I+D7OEUM  2. SOB (shortness of breath) on exertion Penny Hancock does feel that she gets out of breath more easily that she used to when she exercises. Penny Hancock's shortness of breath appears to be obesity related and exercise induced. She has agreed to work on weight loss and  gradually increase exercise to treat her exercise induced shortness of breath. Will continue to monitor closely.  - Comprehensive metabolic panel - NAT+F5D+D2KGUR  3. Essential hypertension Penny Hancock will continue her medications and she is to eliminate added salt.  4. Pure hypercholesterolemia Penny Hancock will continue her  medications as directed.  5. Elevated serum glucose We will check labs today, and we will follow-up at Penny Hancock's next visit.  - Comprehensive metabolic panel - Insulin, random  6. History of vitamin D deficiency We will check labs today, and Penny Hancock will continue vitamin D as directed.  - VITAMIN D 25 Hydroxy (Vit-D Deficiency, Fractures)  7. Health care maintenance EKG and IC was done and reviewed today, and we will check labs today.  - Insulin, random  8. Other specified hypothyroidism We will check labs today, and Penny Hancock will continue her medications as directed.  9. Depression screening Penny Hancock had a positive depression screening. Depression is commonly associated with obesity and often results in emotional eating behaviors. We will monitor this closely and work on CBT to help improve the non-hunger eating patterns. Referral to Psychology may be required if no improvement is seen as she continues in our clinic.  10. Class 3 severe obesity with serious comorbidity and body mass index (BMI) of 45.0 to 49.9 in adult, unspecified obesity type (HCC) Penny Hancock is currently in the action stage of change and her goal is to continue with weight loss efforts. I recommend Penny Hancock begin the structured treatment plan as follows:  She has agreed to the Category 3 Plan.  Meal planning and intentional eating were discussed.  Review labs with the patient from 07/10/2021, CMP, lipids, vitamin D, CBC, glucose, A1c, and thyroid panel.  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.  Behavioral modification strategies: increasing lean protein intake, decreasing simple carbohydrates, increasing vegetables, increasing water intake, decreasing eating out, no skipping meals, meal planning and cooking strategies, keeping healthy foods in the home, and planning for success.  She was informed of the importance of frequent follow-up visits to maximize her success with intensive lifestyle modifications for her  multiple health conditions. She was informed we would discuss her lab results at her next visit unless there is a critical issue that needs to be addressed sooner. Penny Hancock agreed to keep her next visit at the agreed upon time to discuss these results.  Objective:   Blood pressure 119/77, pulse 82, temperature 98.1 F (36.7 C), height '5\' 7"'$  (1.702 m), weight 300 lb (136.1 kg), last menstrual period 12/25/2021, SpO2 98 %. Body mass index is 46.99 kg/m.  EKG: Normal sinus rhythm, rate 69 BPM.  Indirect Calorimeter completed today shows a VO2 of 301 and a REE of 2074.  Her calculated basal metabolic rate is 4270 thus her basal metabolic rate is better than expected.  General: Cooperative, alert, well developed, in no acute distress. HEENT: Conjunctivae and lids unremarkable. Cardiovascular: Regular rhythm.  Lungs: Normal work of breathing. Neurologic: No focal deficits.   Lab Results  Component Value Date   CREATININE 0.80 01/09/2022   BUN 18 01/09/2022   NA 139 01/09/2022   K 4.8 01/09/2022   CL 103 01/09/2022   CO2 20 01/09/2022   Lab Results  Component Value Date   ALT 10 01/09/2022   AST 15 01/09/2022   ALKPHOS 63 01/09/2022   BILITOT 0.5 01/09/2022   Lab Results  Component Value Date   HGBA1C 5.1 07/10/2021   HGBA1C 5.0 08/17/2019   HGBA1C 5.1 04/30/2019   Lab Results  Component Value Date   INSULIN 8.0 01/09/2022   Lab Results  Component Value Date   TSH 1.710 01/09/2022   Lab Results  Component Value Date   CHOL 140 12/26/2021   HDL 54 12/26/2021   LDLCALC 72 12/26/2021   TRIG 63 12/26/2021   CHOLHDL 2.6 12/26/2021   Lab Results  Component Value Date   WBC 5.8 07/10/2021   HGB 14.9 07/10/2021   HCT 44.0 07/10/2021   MCV 95.9 07/10/2021   PLT 246 07/10/2021   No results found for: "IRON", "TIBC", "FERRITIN"  Attestation Statements:   Reviewed by clinician on day of visit: allergies, medications, problem list, medical history, surgical history,  family history, social history, and previous encounter notes.  Wilhemena Durie, am acting as Location manager for [employer], DO.  I have reviewed the above documentation for accuracy and completeness, and I agree with the above. - ***

## 2022-01-24 ENCOUNTER — Encounter: Payer: Self-pay | Admitting: *Deleted

## 2022-01-24 ENCOUNTER — Encounter (INDEPENDENT_AMBULATORY_CARE_PROVIDER_SITE_OTHER): Payer: Self-pay

## 2022-01-24 ENCOUNTER — Encounter (INDEPENDENT_AMBULATORY_CARE_PROVIDER_SITE_OTHER): Payer: Self-pay | Admitting: Bariatrics

## 2022-01-24 ENCOUNTER — Other Ambulatory Visit (HOSPITAL_BASED_OUTPATIENT_CLINIC_OR_DEPARTMENT_OTHER): Payer: Self-pay

## 2022-01-24 NOTE — Progress Notes (Signed)
Approval received for Eliquis from 12/25/21-01/24/23 Case ID [id number] via [facility].

## 2022-01-25 ENCOUNTER — Other Ambulatory Visit: Payer: Self-pay | Admitting: *Deleted

## 2022-01-25 ENCOUNTER — Other Ambulatory Visit (HOSPITAL_BASED_OUTPATIENT_CLINIC_OR_DEPARTMENT_OTHER): Payer: Self-pay

## 2022-01-25 MED ORDER — APIXABAN 5 MG PO TABS
5.0000 mg | ORAL_TABLET | Freq: Two times a day (BID) | ORAL | 3 refills | Status: DC
Start: 2022-01-25 — End: 2023-01-25

## 2022-01-25 NOTE — Telephone Encounter (Signed)
Dr. Marcello Moores informed Dr. Benay Spice that apixaban must now go to [facility]. Refill sent to [facility] and checked CoverMyMeds and noted it was approved on 01/24/22

## 2022-01-26 ENCOUNTER — Other Ambulatory Visit: Payer: Self-pay

## 2022-01-26 DIAGNOSIS — E7800 Pure hypercholesterolemia, unspecified: Secondary | ICD-10-CM

## 2022-01-26 DIAGNOSIS — I10 Essential (primary) hypertension: Secondary | ICD-10-CM

## 2022-01-26 MED ORDER — METOPROLOL TARTRATE 25 MG PO TABS: 12.5000 mg | ORAL_TABLET | Freq: Two times a day (BID) | ORAL | 3 refills | Status: DC

## 2022-01-26 MED ORDER — VITAMIN D (ERGOCALCIFEROL) 1.25 MG (50000 UNIT) PO CAPS: 50000.0000 [IU] | ORAL_CAPSULE | ORAL | 3 refills | Status: DC

## 2022-01-26 MED ORDER — ROSUVASTATIN CALCIUM 5 MG PO TABS: 5.0000 mg | ORAL_TABLET | Freq: Every day | ORAL | 3 refills | Status: DC

## 2022-01-30 NOTE — Progress Notes (Unsigned)
     Chief Complaint:   OBESITY Penny Hancock is here to discuss her progress with her obesity treatment plan along with follow-up of her obesity related diagnoses. Penny Hancock is on {XYIAXKPVVZSMOLMB/EMLJ4:49201} and states she is following her eating plan approximately ***% of the time. Penny Hancock states she is *** *** minutes *** times per week.  Today's visit was #: *** Starting weight: *** Starting date: *** Today's weight: *** Today's date: 01/23/2022 Total lbs lost to date: *** Total lbs lost since last in-office visit: ***  Interim History: ***  Subjective:   1. Other disorder of eating ***  2. Vitamin D deficiency ***  Assessment/Plan:   1. Other disorder of eating *** - buPROPion (WELLBUTRIN SR) 150 MG 12 hr tablet; Take 1 tablet (150 mg total) by mouth daily.  Dispense: 30 tablet; Refill: 0  2. Vitamin D deficiency ***  3. Obesity, current BMI 00.7 Penny Hancock {CHL AMB IS/IS NOT:210130109} currently in the action stage of change. As such, her goal is to {MWMwtloss#1:210800005}. She has agreed to {MWMwtlossportion/plan2:23431}.   Exercise goals: {MWM EXERCISE RECS:23473}  Behavioral modification strategies: {MWMwtlossdietstrategies3:23432}.  Penny Hancock has agreed to follow-up with our clinic in {NUMBER 1-10:22536} weeks. She was informed of the importance of frequent follow-up visits to maximize her success with intensive lifestyle modifications for her multiple health conditions.   Objective:   Blood pressure 125/80, pulse 63, temperature 98 F (36.7 C), height '5\' 7"'$  (1.702 m), weight 299 lb (135.6 kg), last menstrual period 12/25/2021, SpO2 97 %. Body mass index is 46.83 kg/m.  General: Cooperative, alert, well developed, in no acute distress. HEENT: Conjunctivae and lids unremarkable. Cardiovascular: Regular rhythm.  Lungs: Normal work of breathing. Neurologic: No focal deficits.   Lab Results  Component Value Date   CREATININE 0.80 01/09/2022   BUN 18 01/09/2022   NA  139 01/09/2022   K 4.8 01/09/2022   CL 103 01/09/2022   CO2 20 01/09/2022   Lab Results  Component Value Date   ALT 10 01/09/2022   AST 15 01/09/2022   ALKPHOS 63 01/09/2022   BILITOT 0.5 01/09/2022   Lab Results  Component Value Date   HGBA1C 5.1 07/10/2021   HGBA1C 5.0 08/17/2019   HGBA1C 5.1 04/30/2019   Lab Results  Component Value Date   INSULIN 8.0 01/09/2022   Lab Results  Component Value Date   TSH 1.710 01/09/2022   Lab Results  Component Value Date   CHOL 140 12/26/2021   HDL 54 12/26/2021   LDLCALC 72 12/26/2021   TRIG 63 12/26/2021   CHOLHDL 2.6 12/26/2021   Lab Results  Component Value Date   VD25OH 53.3 01/09/2022   VD25OH 59 07/10/2021   VD25OH 23 (L) 04/30/2019   Lab Results  Component Value Date   WBC 5.8 07/10/2021   HGB 14.9 07/10/2021   HCT 44.0 07/10/2021   MCV 95.9 07/10/2021   PLT 246 07/10/2021   No results found for: "IRON", "TIBC", "FERRITIN"  Attestation Statements:   Reviewed by clinician on day of visit: allergies, medications, problem list, medical history, surgical history, family history, social history, and previous encounter notes.   Wilhemena Durie, am acting as Location manager for [employer], DO.  I have reviewed the above documentation for accuracy and completeness, and I agree with the above. -  ***

## 2022-02-18 ENCOUNTER — Other Ambulatory Visit (INDEPENDENT_AMBULATORY_CARE_PROVIDER_SITE_OTHER): Payer: Self-pay | Admitting: Bariatrics

## 2022-02-18 DIAGNOSIS — F5089 Other specified eating disorder: Secondary | ICD-10-CM

## 2022-02-20 ENCOUNTER — Encounter (INDEPENDENT_AMBULATORY_CARE_PROVIDER_SITE_OTHER): Payer: Self-pay | Admitting: Family Medicine

## 2022-02-20 ENCOUNTER — Ambulatory Visit (INDEPENDENT_AMBULATORY_CARE_PROVIDER_SITE_OTHER): Payer: BC Managed Care – PPO | Admitting: Family Medicine

## 2022-02-20 VITALS — BP 115/79 | HR 64 | Ht 67.0 in | Wt 292.0 lb

## 2022-02-20 DIAGNOSIS — I10 Essential (primary) hypertension: Secondary | ICD-10-CM

## 2022-02-20 DIAGNOSIS — E669 Obesity, unspecified: Secondary | ICD-10-CM

## 2022-02-20 DIAGNOSIS — Z6841 Body Mass Index (BMI) 40.0 and over, adult: Secondary | ICD-10-CM | POA: Diagnosis not present

## 2022-02-20 DIAGNOSIS — F3289 Other specified depressive episodes: Secondary | ICD-10-CM | POA: Diagnosis not present

## 2022-02-28 NOTE — Progress Notes (Signed)
Chief Complaint:   OBESITY Penny Hancock is here to discuss her progress with her obesity treatment plan along with follow-up of her obesity related diagnoses. Penny Hancock is on the Category 3 Plan and states she is following her eating plan approximately 80-85% of the time. Penny Hancock states she is on the elliptical and treadmill for 30 minutes 1-2 times per week.  Today's visit was #: 3 Starting weight: 300 lbs Starting date: 01/09/2022 Today's weight: 292 lbs Today's date: 02/20/2022 Total lbs lost to date: 8 Total lbs lost since last in-office visit: 7  Interim History: Penny Hancock has done well with weight loss on her low carbohydrate plan.  She has also been journaling and trying to meet her protein and calorie goals.  Subjective:   1. Essential hypertension Penny Hancock's blood pressure is well controlled with her diet and medications.  She has no signs of dehydration or hypotension.  2. Other depression, emotional eating behavior Penny Hancock started Wellbutrin and she notes increased insomnia especially when she takes it in the evenings.  Assessment/Plan:   1. Essential hypertension Penny Hancock will continue with her diet, exercise, and medications, and will continue to monitor.   2. Other depression, emotional eating behavior We will refill Wellbutrin SR for 1 month, Penny Hancock will change her dose to a.m.; may need to decrease dose if not enough improvement in insomnia.  3. Obesity, current BMI 86.7 Penny Hancock is currently in the action stage of change. As such, her goal is to continue with weight loss efforts. She has agreed to keeping a food journal and adhering to recommended goals of 1500 calories and 90 grams of protein daily.   Easy chicken recipes were given today.  Exercise goals: As is.   Behavioral modification strategies: increasing lean protein intake.  Penny Hancock has agreed to follow-up with our clinic in 2 weeks. She was informed of the importance of frequent follow-up visits to maximize her  success with intensive lifestyle modifications for her multiple health conditions.   Objective:   Blood pressure 115/79, pulse 64, height '5\' 7"'$  (1.702 m), weight 292 lb (132.5 kg), SpO2 99 %. Body mass index is 45.73 kg/m.  General: Cooperative, alert, well developed, in no acute distress. HEENT: Conjunctivae and lids unremarkable. Cardiovascular: Regular rhythm.  Lungs: Normal work of breathing. Neurologic: No focal deficits.   Lab Results  Component Value Date   CREATININE 0.80 01/09/2022   BUN 18 01/09/2022   NA 139 01/09/2022   K 4.8 01/09/2022   CL 103 01/09/2022   CO2 20 01/09/2022   Lab Results  Component Value Date   ALT 10 01/09/2022   AST 15 01/09/2022   ALKPHOS 63 01/09/2022   BILITOT 0.5 01/09/2022   Lab Results  Component Value Date   HGBA1C 5.1 07/10/2021   HGBA1C 5.0 08/17/2019   HGBA1C 5.1 04/30/2019   Lab Results  Component Value Date   INSULIN 8.0 01/09/2022   Lab Results  Component Value Date   TSH 1.710 01/09/2022   Lab Results  Component Value Date   CHOL 140 12/26/2021   HDL 54 12/26/2021   LDLCALC 72 12/26/2021   TRIG 63 12/26/2021   CHOLHDL 2.6 12/26/2021   Lab Results  Component Value Date   VD25OH 53.3 01/09/2022   VD25OH 59 07/10/2021   VD25OH 23 (L) 04/30/2019   Lab Results  Component Value Date   WBC 5.8 07/10/2021   HGB 14.9 07/10/2021   HCT 44.0 07/10/2021   MCV 95.9 07/10/2021   PLT 246  07/10/2021   No results found for: "IRON", "TIBC", "FERRITIN"  Attestation Statements:   Reviewed by clinician on day of visit: allergies, medications, problem list, medical history, surgical history, family history, social history, and previous encounter notes.   I, Trixie Dredge, am acting as transcriptionist for Dennard Nip, MD.  I have reviewed the above documentation for accuracy and completeness, and I agree with the above. -  Dennard Nip, MD

## 2022-03-01 MED ORDER — BUPROPION HCL ER (SR) 150 MG PO TB12: 150.0000 mg | ORAL_TABLET | Freq: Every morning | ORAL | 0 refills | Status: DC

## 2022-03-02 ENCOUNTER — Other Ambulatory Visit: Payer: Self-pay

## 2022-03-06 ENCOUNTER — Ambulatory Visit (INDEPENDENT_AMBULATORY_CARE_PROVIDER_SITE_OTHER): Payer: BC Managed Care – PPO | Admitting: Family Medicine

## 2022-03-06 ENCOUNTER — Encounter (INDEPENDENT_AMBULATORY_CARE_PROVIDER_SITE_OTHER): Payer: Self-pay | Admitting: Family Medicine

## 2022-03-06 VITALS — BP 111/72 | HR 71 | Temp 98.0°F | Ht 67.0 in | Wt 290.0 lb

## 2022-03-06 DIAGNOSIS — Z6841 Body Mass Index (BMI) 40.0 and over, adult: Secondary | ICD-10-CM | POA: Diagnosis not present

## 2022-03-06 DIAGNOSIS — E669 Obesity, unspecified: Secondary | ICD-10-CM

## 2022-03-06 DIAGNOSIS — E559 Vitamin D deficiency, unspecified: Secondary | ICD-10-CM | POA: Diagnosis not present

## 2022-03-08 NOTE — Progress Notes (Signed)
Chief Complaint:   OBESITY Penny Hancock is here to discuss her progress with her obesity treatment plan along with follow-up of her obesity related diagnoses. Penny Hancock is on keeping a food journal and adhering to recommended goals of 1500 calories and 90 protein and states she is following her eating plan approximately 98% of the time. Penny Hancock states she is working at [employer] 30 minutes 2 times per week.  Today's visit was #: 4 Starting weight: 300 lbs Starting date: 01/09/2022 Today's weight: 290 lbs Today's date: 03/05/2022 Total lbs lost to date: 10 lbs Total lbs lost since last in-office visit: 2 lbs  Interim History: Had a trip to [place] Day.  Still mindful of portions sizes and food choices.  Hungry in the afternoon.  Likes sweets at night.  Fiance not very supportive.  Hunger and cravings.  Better with Wellbutrin  SR 150 mg, moved to the morning.   Subjective:   1. Vitamin D deficiency She is currently taking prescription vitamin D 50,000 IU each week, through PCP She denies nausea, vomiting or muscle weakness.  Assessment/Plan:   1. Vitamin D deficiency Continue Vitamin D through PCP.   2. Obesity,current BMI 45.5 Refilled Wellbutrin SR 150 mg every morning for emotional food cravings on 03/01/2022.  Penny Hancock is currently in the action stage of change. As such, her goal is to continue with weight loss efforts. She has agreed to keeping a food journal and adhering to recommended goals of 1500 calories and 120 protein daily.    Exercise goals:  Gym workouts 3 days per week.  Behavioral modification strategies: increasing lean protein intake, increasing water intake, decreasing liquid calories, decreasing eating out, no skipping meals, keeping healthy foods in the home, ways to avoid boredom eating, better snacking choices, and planning for success.  Penny Hancock has agreed to follow-up with our clinic in 3 weeks. She was informed of the importance of frequent follow-up visits to  maximize her success with intensive lifestyle modifications for her multiple health conditions.   Objective:   Blood pressure 111/72, pulse 71, temperature 98 F (36.7 C), height '5\' 7"'$  (1.702 m), weight 290 lb (131.5 kg), SpO2 99 %. Body mass index is 45.42 kg/m.  General: Cooperative, alert, well developed, in no acute distress. HEENT: Conjunctivae and lids unremarkable. Cardiovascular: Regular rhythm.  Lungs: Normal work of breathing. Neurologic: No focal deficits.   Lab Results  Component Value Date   CREATININE 0.80 01/09/2022   BUN 18 01/09/2022   NA 139 01/09/2022   K 4.8 01/09/2022   CL 103 01/09/2022   CO2 20 01/09/2022   Lab Results  Component Value Date   ALT 10 01/09/2022   AST 15 01/09/2022   ALKPHOS 63 01/09/2022   BILITOT 0.5 01/09/2022   Lab Results  Component Value Date   HGBA1C 5.1 07/10/2021   HGBA1C 5.0 08/17/2019   HGBA1C 5.1 04/30/2019   Lab Results  Component Value Date   INSULIN 8.0 01/09/2022   Lab Results  Component Value Date   TSH 1.710 01/09/2022   Lab Results  Component Value Date   CHOL 140 12/26/2021   HDL 54 12/26/2021   LDLCALC 72 12/26/2021   TRIG 63 12/26/2021   CHOLHDL 2.6 12/26/2021   Lab Results  Component Value Date   VD25OH 53.3 01/09/2022   VD25OH 59 07/10/2021   VD25OH 23 (L) 04/30/2019   Lab Results  Component Value Date   WBC 5.8 07/10/2021   HGB 14.9 07/10/2021  HCT 44.0 07/10/2021   MCV 95.9 07/10/2021   PLT 246 07/10/2021   No results found for: "IRON", "TIBC", "FERRITIN"  Attestation Statements:   Reviewed by clinician on day of visit: allergies, medications, problem list, medical history, surgical history, family history, social history, and previous encounter notes.  I, Davy Pique, am acting as Location manager for Loyal Gambler, DO.  I have reviewed the above documentation for accuracy and completeness, and I agree with the above. Dell Ponto, DO

## 2022-03-19 ENCOUNTER — Other Ambulatory Visit: Payer: Self-pay

## 2022-03-20 ENCOUNTER — Other Ambulatory Visit (INDEPENDENT_AMBULATORY_CARE_PROVIDER_SITE_OTHER): Payer: BC Managed Care – PPO

## 2022-03-20 ENCOUNTER — Encounter (INDEPENDENT_AMBULATORY_CARE_PROVIDER_SITE_OTHER): Payer: Self-pay | Admitting: Family Medicine

## 2022-03-20 ENCOUNTER — Ambulatory Visit (INDEPENDENT_AMBULATORY_CARE_PROVIDER_SITE_OTHER): Payer: BC Managed Care – PPO | Admitting: Family Medicine

## 2022-03-20 VITALS — BP 108/72 | HR 67 | Temp 98.3°F | Ht 67.0 in | Wt 292.0 lb

## 2022-03-20 DIAGNOSIS — E559 Vitamin D deficiency, unspecified: Secondary | ICD-10-CM | POA: Diagnosis not present

## 2022-03-20 DIAGNOSIS — E890 Postprocedural hypothyroidism: Secondary | ICD-10-CM | POA: Diagnosis not present

## 2022-03-20 DIAGNOSIS — Z6841 Body Mass Index (BMI) 40.0 and over, adult: Secondary | ICD-10-CM | POA: Diagnosis not present

## 2022-03-20 DIAGNOSIS — F32A Depression, unspecified: Secondary | ICD-10-CM | POA: Insufficient documentation

## 2022-03-20 DIAGNOSIS — E669 Obesity, unspecified: Secondary | ICD-10-CM | POA: Diagnosis not present

## 2022-03-20 DIAGNOSIS — F3289 Other specified depressive episodes: Secondary | ICD-10-CM

## 2022-03-20 LAB — TSH: TSH: 1.27 u[IU]/mL (ref 0.35–5.50)

## 2022-03-20 LAB — T4, FREE: Free T4: 1.16 ng/dL (ref 0.60–1.60)

## 2022-03-20 MED ORDER — VITAMIN D (ERGOCALCIFEROL) 1.25 MG (50000 UNIT) PO CAPS: 50000.0000 [IU] | ORAL_CAPSULE | ORAL | 3 refills | Status: DC

## 2022-03-20 MED ORDER — BUPROPION HCL ER (SR) 200 MG PO TB12: 200.0000 mg | ORAL_TABLET | Freq: Every morning | ORAL | 0 refills | Status: DC

## 2022-03-27 NOTE — Progress Notes (Signed)
Chief Complaint:   OBESITY Penny Hancock is here to discuss her progress with her obesity treatment plan along with follow-up of her obesity related diagnoses. Penny Hancock is on keeping a food journal and adhering to recommended goals of 1500 calories and 120 grams of protein daily and states she is following her eating plan approximately 85% of the time. [name] states she is treadmill for 30 minutes 3 times per week.  Today's visit was #: 5 Starting weight: 300 lbs Starting date: 01/09/2022 Today's weight: 292 lbs Today's date: 03/20/2022 Total lbs lost to date: 8 Total lbs lost since last in-office visit: 0  Interim History: Nil did well with minimizing weight gain while on vacation. She is working on getting back on track.  She has increased exercise to 3 times per week.  She is getting married on October 21.  Subjective:   1. Vitamin D deficiency Penny Hancock is stable on Vitamin D, with no side effects noted.   2. Other depression, emotional eating behavior Penny Hancock has increased stress with preparing for her wedding.  She feels her Wellbutrin is helping a little.  Assessment/Plan:   1. Vitamin D deficiency We will refill prescription Vitamin D 50,000 IU every week for 1 month. She will follow-up for routine testing of Vitamin D, at least 2-3 times per year to avoid over-replacement.  - Vitamin D, Ergocalciferol, (DRISDOL) 1.25 MG (50000 UNIT) CAPS capsule; Take 1 capsule (50,000 Units total) by mouth once a week.  Dispense: 4 capsule; Refill: 0  2. Other depression, emotional eating behavior [name] agreed to increase Wellbutrin SR to 200 mg q AM with no refills.   - buPROPion (WELLBUTRIN SR) 200 MG 12 hr tablet; Take 1 tablet (200 mg total) by mouth every morning.  Dispense: 30 tablet; Refill: 0  3. Obesity, Current BMI 86.7 Penny Hancock is currently in the action stage of change. As such, her goal is to continue with weight loss efforts. She has agreed to keeping a food journal and adhering  to recommended goals of 1500 calories and 85+ grams of protein daily.   Exercise goals: As is.   Behavioral modification strategies: increasing lean protein intake.  Penny Hancock has agreed to follow-up with our clinic in 2 to 3 weeks. She was informed of the importance of frequent follow-up visits to maximize her success with intensive lifestyle modifications for her multiple health conditions.   Objective:   Blood pressure 108/72, pulse 67, temperature 98.3 F (36.8 C), height '5\' 7"'$  (1.702 m), weight 292 lb (132.5 kg), SpO2 99 %. Body mass index is 45.73 kg/m.  General: Cooperative, alert, well developed, in no acute distress. HEENT: Conjunctivae and lids unremarkable. Cardiovascular: Regular rhythm.  Lungs: Normal work of breathing. Neurologic: No focal deficits.   Lab Results  Component Value Date   CREATININE 0.80 01/09/2022   BUN 18 01/09/2022   NA 139 01/09/2022   K 4.8 01/09/2022   CL 103 01/09/2022   CO2 20 01/09/2022   Lab Results  Component Value Date   ALT 10 01/09/2022   AST 15 01/09/2022   ALKPHOS 63 01/09/2022   BILITOT 0.5 01/09/2022   Lab Results  Component Value Date   HGBA1C 5.1 07/10/2021   HGBA1C 5.0 08/17/2019   HGBA1C 5.1 04/30/2019   Lab Results  Component Value Date   INSULIN 8.0 01/09/2022   Lab Results  Component Value Date   TSH 1.27 03/20/2022   Lab Results  Component Value Date   CHOL 140 12/26/2021  HDL 54 12/26/2021   LDLCALC 72 12/26/2021   TRIG 63 12/26/2021   CHOLHDL 2.6 12/26/2021   Lab Results  Component Value Date   VD25OH 53.3 01/09/2022   VD25OH 59 07/10/2021   VD25OH 23 (L) 04/30/2019   Lab Results  Component Value Date   WBC 5.8 07/10/2021   HGB 14.9 07/10/2021   HCT 44.0 07/10/2021   MCV 95.9 07/10/2021   PLT 246 07/10/2021   No results found for: "IRON", "TIBC", "FERRITIN"  Attestation Statements:   Reviewed by clinician on day of visit: allergies, medications, problem list, medical history, surgical  history, family history, social history, and previous encounter notes.   I, Trixie Dredge, am acting as transcriptionist for Dennard Nip, MD.  I have reviewed the above documentation for accuracy and completeness, and I agree with the above. -  Dennard Nip, MD

## 2022-04-02 ENCOUNTER — Encounter (INDEPENDENT_AMBULATORY_CARE_PROVIDER_SITE_OTHER): Payer: Self-pay | Admitting: Family Medicine

## 2022-04-02 ENCOUNTER — Ambulatory Visit (INDEPENDENT_AMBULATORY_CARE_PROVIDER_SITE_OTHER): Payer: BC Managed Care – PPO | Admitting: Family Medicine

## 2022-04-02 VITALS — BP 111/75 | HR 64 | Temp 97.5°F | Ht 67.0 in | Wt 288.0 lb

## 2022-04-02 DIAGNOSIS — F3289 Other specified depressive episodes: Secondary | ICD-10-CM | POA: Diagnosis not present

## 2022-04-02 DIAGNOSIS — E038 Other specified hypothyroidism: Secondary | ICD-10-CM | POA: Diagnosis not present

## 2022-04-02 DIAGNOSIS — E669 Obesity, unspecified: Secondary | ICD-10-CM | POA: Diagnosis not present

## 2022-04-02 DIAGNOSIS — E039 Hypothyroidism, unspecified: Secondary | ICD-10-CM | POA: Insufficient documentation

## 2022-04-02 DIAGNOSIS — Z6841 Body Mass Index (BMI) 40.0 and over, adult: Secondary | ICD-10-CM

## 2022-04-02 MED ORDER — BUPROPION HCL ER (SR) 200 MG PO TB12: 200.0000 mg | ORAL_TABLET | Freq: Every morning | ORAL | 0 refills | Status: DC

## 2022-04-03 ENCOUNTER — Inpatient Hospital Stay: Payer: BC Managed Care – PPO | Attending: Oncology | Admitting: Oncology

## 2022-04-03 VITALS — BP 129/77 | HR 72 | Temp 98.1°F | Resp 18 | Ht 67.0 in | Wt 292.5 lb

## 2022-04-03 DIAGNOSIS — E785 Hyperlipidemia, unspecified: Secondary | ICD-10-CM | POA: Insufficient documentation

## 2022-04-03 DIAGNOSIS — Z7901 Long term (current) use of anticoagulants: Secondary | ICD-10-CM | POA: Diagnosis not present

## 2022-04-03 DIAGNOSIS — Z86711 Personal history of pulmonary embolism: Secondary | ICD-10-CM | POA: Insufficient documentation

## 2022-04-03 DIAGNOSIS — E890 Postprocedural hypothyroidism: Secondary | ICD-10-CM | POA: Insufficient documentation

## 2022-04-03 DIAGNOSIS — Z79899 Other long term (current) drug therapy: Secondary | ICD-10-CM | POA: Diagnosis not present

## 2022-04-03 DIAGNOSIS — I2699 Other pulmonary embolism without acute cor pulmonale: Secondary | ICD-10-CM | POA: Diagnosis not present

## 2022-04-03 DIAGNOSIS — Z85850 Personal history of malignant neoplasm of thyroid: Secondary | ICD-10-CM | POA: Diagnosis not present

## 2022-04-03 NOTE — Progress Notes (Signed)
  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  She continues apixaban.  No bleeding aside from her menstrual cycle.  The menstrual cycle has been lighter since a Mirena was placed.  No symptoms of recurrent venous thrombosis.  No complaint.  Objective:  Vital signs in last 24 hours:  Blood pressure 129/77, pulse 72, temperature 98.1 F (36.7 C), temperature source Oral, resp. rate 18, height '5\' 7"'$  (1.702 m), weight 292 lb 8 oz (132.7 kg), SpO2 98 %.    HEENT: Neck without mass Lymphatics: No cervical or supraclavicular nodes Resp: Lungs clear bilaterally Cardio: Regular rate and rhythm GI: No hepatosplenomegaly Vascular: No leg edema   Lab Results:  Lab Results  Component Value Date   WBC 5.8 07/10/2021   HGB 14.9 07/10/2021   HCT 44.0 07/10/2021   MCV 95.9 07/10/2021   PLT 246 07/10/2021   NEUTROABS 4,060 07/10/2021    CMP  Lab Results  Component Value Date   NA 139 01/09/2022   K 4.8 01/09/2022   CL 103 01/09/2022   CO2 20 01/09/2022   GLUCOSE 87 01/09/2022   BUN 18 01/09/2022   CREATININE 0.80 01/09/2022   CALCIUM 9.1 01/09/2022   PROT 6.4 01/09/2022   ALBUMIN 4.2 01/09/2022   AST 15 01/09/2022   ALT 10 01/09/2022   ALKPHOS 63 01/09/2022   BILITOT 0.5 01/09/2022   GFRNONAA 86 04/30/2019   GFRAA 99 04/30/2019     Medications: I have reviewed the patient's current medications.   Assessment/Plan: Acute bilateral pulmonary emboli November 18, 2017-maintained on indefinite apixaban anticoagulation Negative hypercoagulation panel 11/18/2017 Elevated D-dimer April 2020 while off of apixaban for 1 month 2.  Papillary thyroid cancer, status post a right thyroidectomy 07/24/2018, 1.8 cm, no extrathyroidal extension, negative resection margins, 1 - lymph node 3.  Postsurgical hypothyroidism 4.  COVID-19 infection February 2021 5.  Zoster April 2021-eye involvement 6.  History  of menorrhagia-maintained on Mirena IUD 7.  Hyperlipidemia 8.  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  She continues apixaban anticoagulation.  We discussed reduced intensity anticoagulation.  She is comfortable continuing full dose apixaban for now.  She will call for new symptoms.  She will return for an office visit in 1 year.  Betsy Coder, MD  04/03/2022  12:19 PM

## 2022-04-09 NOTE — Progress Notes (Signed)
Chief Complaint:   OBESITY Penny Hancock is here to discuss her progress with her obesity treatment plan along with follow-up of her obesity related diagnoses. Penny Hancock is on keeping a food journal and adhering to recommended goals of 1500 calories and 85+ grams of protein daily and states she is following her eating plan approximately 80% of the time. Penny Hancock states she is on the treadmill for 30 minutes 3 times per week.  Today's visit was #: 6 Starting weight: 300 lbs Starting date: 01/09/2022 Today's weight: 288 lbs Today's date: 04/02/2022 Total lbs lost to date: 12 Total lbs lost since last in-office visit: 4  Interim History: Penny Hancock continues to do well with weight loss. She is exercising regularly and she is working on [employer], [employer]. She is doing well with meeting her protein goals.   Subjective:   1. Other specified hypothyroidism Penny Hancock is on Synthroid but she takes this with other medications due to her difficult schedule. She asks if she can take Wellbutrin at the same time.   2. Other depression, emotional eating behavior Penny Hancock feels she is doing better with decreasing emotional eating behaviors. She has increased stress with an upcoming wedding, but she has decreased the need for comfort eating.   Assessment/Plan:   1. Other specified hypothyroidism Penny Hancock is ok to take her medication together, but we may need to adjust he thyroid dose. We will recheck labs in 3 months.   2. Other depression, emotional eating behavior Penny Hancock will continue Wellbutrin SR, and we will refill for 1 month. We will continue to follow.   - buPROPion (WELLBUTRIN SR) 200 MG 12 hr tablet; Take 1 tablet (200 mg total) by mouth every morning.  Dispense: 30 tablet; Refill: 0  3. Obesity, Current BMI 35.0 Penny Hancock is currently in the action stage of change. As such, her goal is to continue with weight loss efforts. She has agreed to keeping a food journal and adhering to recommended goals of 1500 calories  and 85+ grams of protein daily.   Exercise goals: As is.   Behavioral modification strategies: increasing lean protein intake, travel eating strategies, and celebration eating strategies.  Penny Hancock has agreed to follow-up with our clinic in 3 to 4 weeks. She was informed of the importance of frequent follow-up visits to maximize her success with intensive lifestyle modifications for her multiple health conditions.   Objective:   Blood pressure 111/75, pulse 64, temperature (!) 97.5 F (36.4 C), height '5\' 7"'$  (1.702 m), weight 288 lb (130.6 kg), SpO2 99 %. Body mass index is 45.11 kg/m.  General: Cooperative, alert, well developed, in no acute distress. HEENT: Conjunctivae and lids unremarkable. Cardiovascular: Regular rhythm.  Lungs: Normal work of breathing. Neurologic: No focal deficits.   Lab Results  Component Value Date   CREATININE 0.80 01/09/2022   BUN 18 01/09/2022   NA 139 01/09/2022   K 4.8 01/09/2022   CL 103 01/09/2022   CO2 20 01/09/2022   Lab Results  Component Value Date   ALT 10 01/09/2022   AST 15 01/09/2022   ALKPHOS 63 01/09/2022   BILITOT 0.5 01/09/2022   Lab Results  Component Value Date   HGBA1C 5.1 07/10/2021   HGBA1C 5.0 08/17/2019   HGBA1C 5.1 04/30/2019   Lab Results  Component Value Date   INSULIN 8.0 01/09/2022   Lab Results  Component Value Date   TSH 1.27 03/20/2022   Lab Results  Component Value Date   CHOL 140 12/26/2021   HDL 54  12/26/2021   LDLCALC 72 12/26/2021   TRIG 63 12/26/2021   CHOLHDL 2.6 12/26/2021   Lab Results  Component Value Date   VD25OH 53.3 01/09/2022   VD25OH 59 07/10/2021   VD25OH 23 (L) 04/30/2019   Lab Results  Component Value Date   WBC 5.8 07/10/2021   HGB 14.9 07/10/2021   HCT 44.0 07/10/2021   MCV 95.9 07/10/2021   PLT 246 07/10/2021   No results found for: "IRON", "TIBC", "FERRITIN"  Attestation Statements:   Reviewed by clinician on day of visit: allergies, medications, problem list,  medical history, surgical history, family history, social history, and previous encounter notes.   I, Trixie Dredge, am acting as transcriptionist for Dennard Nip, MD.  I have reviewed the above documentation for accuracy and completeness, and I agree with the above. -  Dennard Nip, MD

## 2022-04-10 ENCOUNTER — Inpatient Hospital Stay: Payer: No Typology Code available for payment source | Admitting: Oncology

## 2022-04-23 ENCOUNTER — Other Ambulatory Visit: Payer: Self-pay

## 2022-04-23 DIAGNOSIS — E890 Postprocedural hypothyroidism: Secondary | ICD-10-CM

## 2022-04-23 MED ORDER — LEVOTHYROXINE SODIUM 50 MCG PO TABS: ORAL_TABLET | ORAL | 3 refills | Status: DC

## 2022-04-24 ENCOUNTER — Encounter (INDEPENDENT_AMBULATORY_CARE_PROVIDER_SITE_OTHER): Payer: Self-pay | Admitting: Family Medicine

## 2022-04-24 ENCOUNTER — Ambulatory Visit (INDEPENDENT_AMBULATORY_CARE_PROVIDER_SITE_OTHER): Payer: BC Managed Care – PPO | Admitting: Family Medicine

## 2022-04-24 VITALS — BP 136/82 | HR 83 | Temp 98.1°F | Ht 67.0 in | Wt 282.0 lb

## 2022-04-24 DIAGNOSIS — Z6841 Body Mass Index (BMI) 40.0 and over, adult: Secondary | ICD-10-CM | POA: Diagnosis not present

## 2022-04-24 DIAGNOSIS — E669 Obesity, unspecified: Secondary | ICD-10-CM

## 2022-04-24 DIAGNOSIS — E559 Vitamin D deficiency, unspecified: Secondary | ICD-10-CM | POA: Diagnosis not present

## 2022-04-24 DIAGNOSIS — F439 Reaction to severe stress, unspecified: Secondary | ICD-10-CM | POA: Diagnosis not present

## 2022-05-02 NOTE — Progress Notes (Signed)
Chief Complaint:   OBESITY Penny Hancock is here to discuss her progress with her obesity treatment plan along with follow-up of her obesity related diagnoses. Penny Hancock is on keeping a food journal and adhering to recommended goals of 1500 calories and 85+ grams of protein daily and states she is following her eating plan approximately 85-90% of the time. [name] states she is on the treadmill for 30 minutes 1-2 times per week.  Today's visit was #: 7 Starting weight: 300 lbs Starting date: 01/09/2022 Today's weight: 282 lbs Today's date: 04/24/2022 Total lbs lost to date: 18 Total lbs lost since last in-office visit: 6  Interim History: Penny Hancock continues to work on her weight loss. She is going through extreme relationship stress and she has a period of eating much. She is working and getting back on track with her eating plan.   Subjective:   1. Stress [name] is dealing with increased relationship stress. She is working on decreasing emotional eating behaviors and she is doing well on Wellbutrin. We discussed the option of starting an SSRI.   2. Vitamin D deficiency Penny Hancock is stable on Vitamin D, and she denies nausea, vomiting, or muscle weakness.   Assessment/Plan:   1. Stress Penny Hancock deferred other medications, and we will continue to monitor.   2. Vitamin D deficiency Penny Hancock will continue Vitamin D prescription, and we will recheck labs in 2 months.   3. Obesity, Current BMI 82.5 Penny Hancock is currently in the action stage of change. As such, her goal is to continue with weight loss efforts. She has agreed to keeping a food journal and adhering to recommended goals of 1500 calories and 85+ grams of protein daily.    Exercise goals: As is.   Behavioral modification strategies: increasing lean protein intake.  Penny Hancock has agreed to follow-up with our clinic in 3 weeks. She was informed of the importance of frequent follow-up visits to maximize her success with intensive lifestyle  modifications for her multiple health conditions.   Objective:   Blood pressure 136/82, pulse 83, temperature 98.1 F (36.7 C), height '5\' 7"'$  (1.702 m), weight 282 lb (127.9 kg), SpO2 98 %. Body mass index is 44.17 kg/m.  General: Cooperative, alert, well developed, in no acute distress. HEENT: Conjunctivae and lids unremarkable. Cardiovascular: Regular rhythm.  Lungs: Normal work of breathing. Neurologic: No focal deficits.   Lab Results  Component Value Date   CREATININE 0.80 01/09/2022   BUN 18 01/09/2022   NA 139 01/09/2022   K 4.8 01/09/2022   CL 103 01/09/2022   CO2 20 01/09/2022   Lab Results  Component Value Date   ALT 10 01/09/2022   AST 15 01/09/2022   ALKPHOS 63 01/09/2022   BILITOT 0.5 01/09/2022   Lab Results  Component Value Date   HGBA1C 5.1 07/10/2021   HGBA1C 5.0 08/17/2019   HGBA1C 5.1 04/30/2019   Lab Results  Component Value Date   INSULIN 8.0 01/09/2022   Lab Results  Component Value Date   TSH 1.27 03/20/2022   Lab Results  Component Value Date   CHOL 140 12/26/2021   HDL 54 12/26/2021   LDLCALC 72 12/26/2021   TRIG 63 12/26/2021   CHOLHDL 2.6 12/26/2021   Lab Results  Component Value Date   VD25OH 53.3 01/09/2022   VD25OH 59 07/10/2021   VD25OH 23 (L) 04/30/2019   Lab Results  Component Value Date   WBC 5.8 07/10/2021   HGB 14.9 07/10/2021   HCT 44.0 07/10/2021  MCV 95.9 07/10/2021   PLT 246 07/10/2021   No results found for: "IRON", "TIBC", "FERRITIN"  Attestation Statements:   Reviewed by clinician on day of visit: allergies, medications, problem list, medical history, surgical history, family history, social history, and previous encounter notes.  Time spent on visit including pre-visit chart review and post-visit care and charting was 30 minutes.   I, Trixie Dredge, am acting as transcriptionist for Dennard Nip, MD.  I have reviewed the above documentation for accuracy and completeness, and I agree with the  above. -  Dennard Nip, MD

## 2022-05-15 ENCOUNTER — Ambulatory Visit (INDEPENDENT_AMBULATORY_CARE_PROVIDER_SITE_OTHER): Payer: BC Managed Care – PPO | Admitting: Family Medicine

## 2022-05-15 ENCOUNTER — Encounter (INDEPENDENT_AMBULATORY_CARE_PROVIDER_SITE_OTHER): Payer: Self-pay | Admitting: Family Medicine

## 2022-05-15 VITALS — BP 120/84 | HR 69 | Temp 98.5°F | Ht 67.0 in | Wt 283.8 lb

## 2022-05-15 DIAGNOSIS — E669 Obesity, unspecified: Secondary | ICD-10-CM | POA: Diagnosis not present

## 2022-05-15 DIAGNOSIS — F3289 Other specified depressive episodes: Secondary | ICD-10-CM

## 2022-05-15 DIAGNOSIS — Z6841 Body Mass Index (BMI) 40.0 and over, adult: Secondary | ICD-10-CM | POA: Diagnosis not present

## 2022-05-15 MED ORDER — BUPROPION HCL ER (SR) 200 MG PO TB12: 200.0000 mg | ORAL_TABLET | Freq: Every morning | ORAL | 0 refills | Status: DC

## 2022-05-23 NOTE — Progress Notes (Signed)
Chief Complaint:   OBESITY Penny Hancock is here to discuss her progress with her obesity treatment plan along with follow-up of her obesity related diagnoses. Penny Hancock is on keeping a food journal and adhering to recommended goals of 1500 calories and 85+ grams of protein and states she is following her eating plan approximately 90% of the time. [name] states she is on the treadmill for 30 minutes 1-2 times per week.  Today's visit was #: 8 Starting weight: 300 lbs Starting date: 01/09/2022 Today's weight: 283 lbs Today's date: 05/15/2022 Total lbs lost to date: 17 Total lbs lost since last in-office visit: 0  Interim History: Penny Hancock feels she is struggling to eat breakfast and lunch, but then overdoes dinner.  She feels she is not meeting her calorie or protein goals and this could be decreasing her RMR.  Subjective:   1. Other depression, emotional eating behavior Penny Hancock notes work stress and some family stress.  She is stable on Wellbutrin with no insomnia and her blood pressure is stable.  Assessment/Plan:   1. Other depression, emotional eating behavior Penny Hancock will continue Wellbutrin SR 200 mg once daily, and we will refill for 1 month.  - buPROPion (WELLBUTRIN SR) 200 MG 12 hr tablet; Take 1 tablet (200 mg total) by mouth every morning.  Dispense: 30 tablet; Refill: 0  2. Obesity, Current BMI 26.7 Penny Hancock is currently in the action stage of change. As such, her goal is to continue with weight loss efforts. She has agreed to keeping a food journal and adhering to recommended goals of 1500 calories and 85+ grams of protein daily.   Exercise goals: As is.   Behavioral modification strategies: increasing lean protein intake.  Penny Hancock has agreed to follow-up with our clinic in 3 weeks. She was informed of the importance of frequent follow-up visits to maximize her success with intensive lifestyle modifications for her multiple health conditions.   Objective:   Blood pressure  120/84, pulse 69, temperature 98.5 F (36.9 C), height '5\' 7"'$  (1.702 m), weight 283 lb 12.8 oz (128.7 kg), SpO2 98 %. Body mass index is 44.45 kg/m.  General: Cooperative, alert, well developed, in no acute distress. HEENT: Conjunctivae and lids unremarkable. Cardiovascular: Regular rhythm.  Lungs: Normal work of breathing. Neurologic: No focal deficits.   Lab Results  Component Value Date   CREATININE 0.80 01/09/2022   BUN 18 01/09/2022   NA 139 01/09/2022   K 4.8 01/09/2022   CL 103 01/09/2022   CO2 20 01/09/2022   Lab Results  Component Value Date   ALT 10 01/09/2022   AST 15 01/09/2022   ALKPHOS 63 01/09/2022   BILITOT 0.5 01/09/2022   Lab Results  Component Value Date   HGBA1C 5.1 07/10/2021   HGBA1C 5.0 08/17/2019   HGBA1C 5.1 04/30/2019   Lab Results  Component Value Date   INSULIN 8.0 01/09/2022   Lab Results  Component Value Date   TSH 1.27 03/20/2022   Lab Results  Component Value Date   CHOL 140 12/26/2021   HDL 54 12/26/2021   LDLCALC 72 12/26/2021   TRIG 63 12/26/2021   CHOLHDL 2.6 12/26/2021   Lab Results  Component Value Date   VD25OH 53.3 01/09/2022   VD25OH 59 07/10/2021   VD25OH 23 (L) 04/30/2019   Lab Results  Component Value Date   WBC 5.8 07/10/2021   HGB 14.9 07/10/2021   HCT 44.0 07/10/2021   MCV 95.9 07/10/2021   PLT 246 07/10/2021  No results found for: "IRON", "TIBC", "FERRITIN"  Attestation Statements:   Reviewed by clinician on day of visit: allergies, medications, problem list, medical history, surgical history, family history, social history, and previous encounter notes.  Time spent on visit including pre-visit chart review and post-visit care and charting was 30 minutes.   I, Trixie Dredge, am acting as transcriptionist for Dennard Nip, MD.  I have reviewed the above documentation for accuracy and completeness, and I agree with the above. -  Dennard Nip, MD

## 2022-05-24 ENCOUNTER — Other Ambulatory Visit (INDEPENDENT_AMBULATORY_CARE_PROVIDER_SITE_OTHER): Payer: Self-pay | Admitting: Family Medicine

## 2022-05-24 DIAGNOSIS — F3289 Other specified depressive episodes: Secondary | ICD-10-CM

## 2022-05-30 ENCOUNTER — Ambulatory Visit (INDEPENDENT_AMBULATORY_CARE_PROVIDER_SITE_OTHER): Payer: BC Managed Care – PPO | Admitting: Family Medicine

## 2022-05-30 VITALS — BP 119/81 | HR 60 | Temp 98.2°F | Ht 67.0 in | Wt 279.0 lb

## 2022-05-30 DIAGNOSIS — Z6841 Body Mass Index (BMI) 40.0 and over, adult: Secondary | ICD-10-CM | POA: Diagnosis not present

## 2022-05-30 DIAGNOSIS — E669 Obesity, unspecified: Secondary | ICD-10-CM | POA: Diagnosis not present

## 2022-05-30 DIAGNOSIS — F3289 Other specified depressive episodes: Secondary | ICD-10-CM | POA: Diagnosis not present

## 2022-05-30 MED ORDER — BUPROPION HCL ER (SR) 200 MG PO TB12: 200.0000 mg | ORAL_TABLET | Freq: Every morning | ORAL | 0 refills | Status: DC

## 2022-06-04 ENCOUNTER — Ambulatory Visit (HOSPITAL_BASED_OUTPATIENT_CLINIC_OR_DEPARTMENT_OTHER): Payer: No Typology Code available for payment source | Admitting: Obstetrics & Gynecology

## 2022-06-13 NOTE — Progress Notes (Signed)
Chief Complaint:   OBESITY Penny Hancock is here to discuss her progress with her obesity treatment plan along with follow-up of her obesity related diagnoses. Penny Hancock is on keeping a food journal and adhering to recommended goals of 1500 calories and 85+ grams of protein and states she is following her eating plan approximately 85% of the time. Penny Hancock states she is on the treadmill for 30 minutes 2 times per week.  Today's visit was #: 9 Starting weight: 300 lbs Starting date: 01/09/2022 Today's weight: 279 lbs Today's date: 05/30/2022 Total lbs lost to date: 21 Total lbs lost since last in-office visit: 4  Interim History: Penny Hancock continues to do well with her weight loss even over Thanksgiving. She is doing well with decreasing simple carbohydrates and she has had a bit more fat.   Subjective:   1. Other depression, emotional eating behavior Penny Hancock is doing well on her medications with no side effects noted. She has been out for 3 days due to a pharmacy error.   Assessment/Plan:   1. Other depression, emotional eating behavior Penny Hancock will continue metformin SR 200 mg once daily, and we will refill for 1 month.   - buPROPion (WELLBUTRIN SR) 200 MG 12 hr tablet; Take 1 tablet (200 mg total) by mouth every morning.  Dispense: 30 tablet; Refill: 0  2. Obesity, Current BMI 19.4 Penny Hancock is currently in the action stage of change. As such, her goal is to continue with weight loss efforts. She has agreed to keeping a food journal and adhering to recommended goals of 1500 calories and 85+ grams of protein daily.   Exercise goals: As is.   Behavioral modification strategies: increasing lean protein intake.  Penny Hancock has agreed to follow-up with our clinic in 4 weeks. She was informed of the importance of frequent follow-up visits to maximize her success with intensive lifestyle modifications for her multiple health conditions.   Objective:   Blood pressure 119/81, pulse 60, temperature 98.2  F (36.8 C), height '5\' 7"'$  (1.702 m), weight 279 lb (126.6 kg), SpO2 99 %. Body mass index is 43.7 kg/m.  General: Cooperative, alert, well developed, in no acute distress. HEENT: Conjunctivae and lids unremarkable. Cardiovascular: Regular rhythm.  Lungs: Normal work of breathing. Neurologic: No focal deficits.   Lab Results  Component Value Date   CREATININE 0.80 01/09/2022   BUN 18 01/09/2022   NA 139 01/09/2022   K 4.8 01/09/2022   CL 103 01/09/2022   CO2 20 01/09/2022   Lab Results  Component Value Date   ALT 10 01/09/2022   AST 15 01/09/2022   ALKPHOS 63 01/09/2022   BILITOT 0.5 01/09/2022   Lab Results  Component Value Date   HGBA1C 5.1 07/10/2021   HGBA1C 5.0 08/17/2019   HGBA1C 5.1 04/30/2019   Lab Results  Component Value Date   INSULIN 8.0 01/09/2022   Lab Results  Component Value Date   TSH 1.27 03/20/2022   Lab Results  Component Value Date   CHOL 140 12/26/2021   HDL 54 12/26/2021   LDLCALC 72 12/26/2021   TRIG 63 12/26/2021   CHOLHDL 2.6 12/26/2021   Lab Results  Component Value Date   VD25OH 53.3 01/09/2022   VD25OH 59 07/10/2021   VD25OH 23 (L) 04/30/2019   Lab Results  Component Value Date   WBC 5.8 07/10/2021   HGB 14.9 07/10/2021   HCT 44.0 07/10/2021   MCV 95.9 07/10/2021   PLT 246 07/10/2021   No results found  for: "IRON", "TIBC", "FERRITIN"  Attestation Statements:   Reviewed by clinician on day of visit: allergies, medications, problem list, medical history, surgical history, family history, social history, and previous encounter notes.   I, Trixie Dredge, am acting as transcriptionist for Dennard Nip, MD.  I have reviewed the above documentation for accuracy and completeness, and I agree with the above. -  Dennard Nip, MD

## 2022-06-14 ENCOUNTER — Ambulatory Visit (HOSPITAL_BASED_OUTPATIENT_CLINIC_OR_DEPARTMENT_OTHER): Payer: No Typology Code available for payment source | Admitting: Obstetrics & Gynecology

## 2022-06-19 ENCOUNTER — Ambulatory Visit (INDEPENDENT_AMBULATORY_CARE_PROVIDER_SITE_OTHER): Payer: BC Managed Care – PPO | Admitting: Family Medicine

## 2022-06-19 ENCOUNTER — Encounter (INDEPENDENT_AMBULATORY_CARE_PROVIDER_SITE_OTHER): Payer: Self-pay | Admitting: Family Medicine

## 2022-06-19 VITALS — BP 116/79 | HR 67 | Ht 67.0 in | Wt 281.0 lb

## 2022-06-19 DIAGNOSIS — M461 Sacroiliitis, not elsewhere classified: Secondary | ICD-10-CM

## 2022-06-19 DIAGNOSIS — E559 Vitamin D deficiency, unspecified: Secondary | ICD-10-CM

## 2022-06-19 DIAGNOSIS — E669 Obesity, unspecified: Secondary | ICD-10-CM

## 2022-06-19 DIAGNOSIS — F3289 Other specified depressive episodes: Secondary | ICD-10-CM

## 2022-06-19 DIAGNOSIS — Z6841 Body Mass Index (BMI) 40.0 and over, adult: Secondary | ICD-10-CM

## 2022-06-19 MED ORDER — VITAMIN D (ERGOCALCIFEROL) 1.25 MG (50000 UNIT) PO CAPS: 50000.0000 [IU] | ORAL_CAPSULE | ORAL | 0 refills | Status: DC

## 2022-06-19 MED ORDER — BUPROPION HCL ER (SR) 200 MG PO TB12: 200.0000 mg | ORAL_TABLET | Freq: Every morning | ORAL | 0 refills | Status: DC

## 2022-06-21 ENCOUNTER — Ambulatory Visit: Payer: BC Managed Care – PPO | Attending: Family Medicine | Admitting: Rehabilitative and Restorative Service Providers"

## 2022-06-21 ENCOUNTER — Other Ambulatory Visit: Payer: Self-pay

## 2022-06-21 ENCOUNTER — Encounter: Payer: Self-pay | Admitting: Rehabilitative and Restorative Service Providers"

## 2022-06-21 DIAGNOSIS — M461 Sacroiliitis, not elsewhere classified: Secondary | ICD-10-CM | POA: Diagnosis not present

## 2022-06-21 DIAGNOSIS — M533 Sacrococcygeal disorders, not elsewhere classified: Secondary | ICD-10-CM | POA: Insufficient documentation

## 2022-06-21 DIAGNOSIS — M6281 Muscle weakness (generalized): Secondary | ICD-10-CM | POA: Diagnosis not present

## 2022-06-21 DIAGNOSIS — R252 Cramp and spasm: Secondary | ICD-10-CM

## 2022-06-21 NOTE — Therapy (Signed)
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 DOB:November 26, 1980, 42 y.o., female Today's Date: 06/21/2022  END OF SESSION:  PT End of Session - 06/21/22 1107     Visit Number 1    Date for PT Re-Evaluation 08/17/22    Authorization Type BC/BS    PT Start Time 1100    PT Stop Time 1140    PT Time Calculation (min) 40 min    Activity Tolerance Patient tolerated treatment well    Behavior During Therapy WFL for tasks assessed/performed             Past Medical History:  Diagnosis Date   Back pain    GERD (gastroesophageal reflux disease)    resolved    H/O blood clots    High cholesterol    Hypertension    Hypothyroidism    Joint pain    PONV (postoperative nausea and vomiting)    mild nausea responsive to meds    Pulmonary embolism ([place]) 10/2017   Thyroid carcinoma ([place])    Vitamin D deficiency    Past Surgical History:  Procedure Laterality Date   broken wrist     KNEE SURGERY     arthroscopy    THYROIDECTOMY Right 07/24/2018   Procedure: RIGHT THYROID LOBECTOMY  AND ISTHMUS;  Surgeon: Armandina Gemma, MD;  Location: [facility];  Service: General;  Laterality: Right;   Patient Active Problem List   Diagnosis Date Noted   Sacroiliitis ([place]) 06/19/2022   Stress 04/24/2022   Other specified hypothyroidism 04/02/2022   Depression 03/20/2022   Class 3 severe obesity with serious comorbidity and body mass index (BMI) of 45.0 to 49.9 in adult ([place]) 03/20/2022   Vitamin D deficiency 03/06/2022   History of pulmonary embolism 11/28/2021   Hypercholesterolemia 04/27/2021   Hypertensive disorder 04/27/2021   Shingles of eyelid 02/22/2020   Postsurgical hypothyroidism 10/28/2018   Papillary thyroid carcinoma ([place]) 10/28/2018    PCP: Elby Showers, MD  REFERRING PROVIDER: Starlyn Skeans, MD  REFERRING DIAG: M46.1 (ICD-10-CM) - Sacroiliitis ([place])  THERAPY DIAG:  Muscle weakness (generalized) - Plan: PT  plan of care cert/re-cert  SI (sacroiliac) pain - Plan: PT plan of care cert/re-cert  Cramp and spasm - Plan: PT plan of care cert/re-cert  Rationale for Evaluation and Treatment: Rehabilitation  ONSET DATE: 6-8 months ago  SUBJECTIVE:   SUBJECTIVE STATEMENT: Pt reports that she has been having the SI pain for approx 6-8 months.  States that she has done quad stretching and tried other stretches, but has not had much consistent relief.  PERTINENT HISTORY: bilat pulmonary embolism on 11/18/2017, thyroid cancer s/p right thyroidectomy 07/24/2018, elevated BMI, HTN  PAIN:  Are you having pain? Yes: NPRS scale: 2-5/10 Pain location: right back pain, but also bilat psoas pain Pain description: sore, occasional sharp pains with certain movements Aggravating factors: bending, sitting Relieving factors: standing, lying flat  PRECAUTIONS: None  WEIGHT BEARING RESTRICTIONS: No  FALLS:  Has patient fallen in last 6 months? No  LIVING ENVIRONMENT: Lives with: lives alone Lives in: House/apartment Stairs: Yes: Internal: 15 steps; can reach both and External: 3-4 steps; can reach both Has following equipment at home: Single point cane, Crutches, and None  OCCUPATION: Surgeon, does robotic surgery  PLOF: Independent, Vocation/Vocational requirements: sitting with robotic surgery, and Leisure: gardening  PATIENT GOALS: To no longer have pain.  NEXT MD VISIT: as needed  OBJECTIVE:   DIAGNOSTIC FINDINGS: n/a  PATIENT  SURVEYS:  FOTO 46% (projected 68% by visit 13)  COGNITION: Overall cognitive status: Within functional limits for tasks assessed     SENSATION: Pt reports when she is on treadmill or elliptical, has numbness on 3rd and 4th toes (primarily on left)  MUSCLE LENGTH: Hamstrings: Right 80 deg; Left 75 deg Tocci test: increased tightness on right side  PALPATION: Tenderness to palpation along SI joint  LOWER EXTREMITY ROM: WFL  LOWER EXTREMITY MMT:  Left hip  strength 4/5, Right is WFL   LOWER EXTREMITY SPECIAL TESTS:  Hip special tests: SI distraction test: positive   FUNCTIONAL TESTS:  06/21/2021: 30 seconds chair stand test 14 stands with discomfort in SI joint in left  GAIT: Distance walked: >500 ft Assistive device utilized: None Level of assistance: Complete Independence Comments: denies pain with in clinic ambulation   TODAY'S TREATMENT:                                                                                                                              DATE: 06/21/2022  Reviewed HEP (see below)   PATIENT EDUCATION:  Education details: Issued HEP Person educated: Patient Education method: Explanation, Media planner, and Handouts Education comprehension: verbalized understanding  HOME EXERCISE PROGRAM: Access Code: [email or web address] URL: [email or web address] Date: 06/21/2022 Prepared by: Shelby Dubin Lydon Vansickle  Exercises - Clamshell  - 1 x daily - 7 x weekly - 2 sets - 10 reps - Sidelying Reverse Clamshell  - 1 x daily - 7 x weekly - 2 sets - 10 reps - Supine SI Joint Self-Correction  - 1 x daily - 7 x weekly - 2 sets - 5 reps - 5 sec hold - Psoas Mobilization with Small Ball  - 1 x daily - 7 x weekly - 2 sets - 10 reps - Half Kneeling Hip Flexor Stretch  - 1 x daily - 7 x weekly - 1 sets - 2 reps - 20 sec hold  ASSESSMENT:  CLINICAL IMPRESSION: Patient is a 42 y.o. female who was seen today for physical therapy evaluation and treatment for sacroillitis. 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.  Patient presents with SI joint pain, difficulty performing functional tasks, and muscle tightness noted.  Patient would benefit from skilled PT to address her functional impairments to allow her to return to her painfree lifestyle.  OBJECTIVE IMPAIRMENTS: decreased strength, increased muscle spasms, impaired flexibility, and pain.   ACTIVITY  LIMITATIONS: bending and sitting  PARTICIPATION LIMITATIONS: community activity and occupation  PERSONAL FACTORS: Time since onset of injury/illness/exacerbation are also affecting patient's functional outcome.   REHAB POTENTIAL: Good  CLINICAL DECISION MAKING: Stable/uncomplicated  EVALUATION COMPLEXITY: Low   GOALS: Goals reviewed with patient? Yes  SHORT TERM GOALS: Target date: 07/12/2022 Pt will be independent with initial HEP. Baseline: Goal status: INITIAL  2.  Patient will report at least a 30% improvements in functional mobility.  Baseline:  Goal status: INITIAL   LONG TERM GOALS: Target date: 08/17/2022  Patient will be independent with advanced HEP. Baseline:  Goal status: INITIAL  2.  Patient will be able to work a shift of work without increased SI pain. Baseline:  Goal status: INITIAL  3.  Patient will be able to exercise in the gym without increased SI pain. Baseline:  Goal status: INITIAL  4.  Patient will increase FOTO to at least 68% to demonstrate improvements in functional mobility. Baseline: 46% Goal status: INITIAL  5.  Patient will increase left hip strength to at least 4+/5 to allow her to perform functional tasks with increased ease. Baseline:  Goal status: INITIAL   PLAN:  PT FREQUENCY: 1-2x/week  PT DURATION: 8 weeks  PLANNED INTERVENTIONS: Therapeutic exercises, Therapeutic activity, Neuromuscular re-education, Balance training, Gait training, Patient/Family education, Self Care, Joint mobilization, Joint manipulation, Stair training, Aquatic Therapy, Dry Needling, Electrical stimulation, Spinal manipulation, Spinal mobilization, Cryotherapy, Moist heat, Taping, Traction, Ultrasound, Ionotophoresis '4mg'$ /ml Dexamethasone, Manual therapy, and Re-evaluation  PLAN FOR NEXT SESSION: assess and progress HEP as indicated, aquatic PT, dry needling/manual therapy as indicated, core stability   Juel Burrow, PT 06/21/2022, 12:31 PM     [facility] [address] [place], [place] [address] Phone # [phone] Fax [phone]

## 2022-06-21 NOTE — Patient Instructions (Signed)
[facility] Welcome to [place]! Here you will find all the information you will need regarding your pool therapy. If you have further questions at any time, please call our office at [phone]. After completing your initial evaluation in the [facility] clinic, you may be eligible to complete a portion of your therapy in the pool. A typical week of therapy will consist of 1-2 typical physical therapy visits at our [place] location and an additional session of therapy in the pool located at the [facility]. [address], [place]. The phone number at the pool site is [phone]. Please call this number if you are running late or need to cancel your appointment.  Aquatic therapy will be offered on Wednesday mornings and Friday afternoons. Each session will last approximately 45 minutes. All scheduling and payments for aquatic therapy sessions, including cancelations, will be done through our [place] location.  To be eligible for aquatic therapy, these criteria must be met: You must be able to independently change in the locker room and get to the pool deck. A caregiver can come with you to help if needed. There are benches for a caregiver to sit on next to the pool. No one with an open wound is permitted in the pool.  Handicap parking is available in the front and there is a drop off option for even closer accessibility. Please arrive 15 minutes prior to your appointment to prepare for your pool session. You must sign in at the front desk upon your arrival. Please be sure to attend to any toileting needs prior to entering the pool. [place] rooms for changing are available.  There is direct access to the pool deck from the locker room. You can lock your belongings in a locker or bring them with you poolside. Your therapist will greet you on the pool deck. There may be other swimmers in the pool at the  same time but your session is one-on-one with the therapist.    Trigger Point Dry Needling  What is Trigger Point Dry Needling (DN)? DN is a physical therapy technique used to treat muscle pain and dysfunction. Specifically, DN helps deactivate muscle trigger points (muscle knots).  A thin filiform needle is used to penetrate the skin and stimulate the underlying trigger point. The goal is for a local twitch response (LTR) to occur and for the trigger point to relax. No medication of any kind is injected during the procedure.   What Does Trigger Point Dry Needling Feel Like?  The procedure feels different for each individual patient. Some patients report that they do not actually feel the needle enter the skin and overall the process is not painful. Very mild bleeding may occur. However, many patients feel a deep cramping in the muscle in which the needle was inserted. This is the local twitch response.   How Will I feel after the treatment? Soreness is normal, and the onset of soreness may not occur for a few hours. Typically this soreness does not last longer than two days.  Bruising is uncommon, however; ice can be used to decrease any possible bruising.  In rare cases feeling tired or nauseous after the treatment is normal. In addition, your symptoms may get worse before they get better, this period will typically not last longer than 24 hours.   What Can I do After My Treatment? Increase your hydration by drinking more water for the next 24 hours.  You may place ice or heat on the areas treated that have become sore, however, do not use heat on inflamed or bruised areas. Heat often brings more relief post needling. You can continue your regular activities, but vigorous activity is not recommended initially after the treatment for 24 hours. DN is best combined with other physical therapy such as strengthening, stretching, and other therapies.  [facility]  [address] [place] [address].  [phone]

## 2022-06-28 ENCOUNTER — Ambulatory Visit (HOSPITAL_BASED_OUTPATIENT_CLINIC_OR_DEPARTMENT_OTHER): Payer: BC Managed Care – PPO | Admitting: Physical Therapy

## 2022-06-28 ENCOUNTER — Encounter (HOSPITAL_BASED_OUTPATIENT_CLINIC_OR_DEPARTMENT_OTHER): Payer: Self-pay | Admitting: Obstetrics & Gynecology

## 2022-06-28 ENCOUNTER — Encounter (HOSPITAL_BASED_OUTPATIENT_CLINIC_OR_DEPARTMENT_OTHER): Payer: Self-pay

## 2022-06-28 ENCOUNTER — Ambulatory Visit (INDEPENDENT_AMBULATORY_CARE_PROVIDER_SITE_OTHER): Payer: BC Managed Care – PPO | Admitting: Obstetrics & Gynecology

## 2022-06-28 ENCOUNTER — Other Ambulatory Visit (HOSPITAL_COMMUNITY)
Admission: RE | Admit: 2022-06-28 | Discharge: 2022-06-28 | Disposition: A | Discharge status: Home / Self Care | Payer: BC Managed Care – PPO | Source: Ambulatory Visit | Attending: Obstetrics & Gynecology | Admitting: Obstetrics & Gynecology

## 2022-06-28 VITALS — BP 110/74 | HR 71 | Ht 67.0 in | Wt 282.8 lb

## 2022-06-28 DIAGNOSIS — R87810 Cervical high risk human papillomavirus (HPV) DNA test positive: Secondary | ICD-10-CM | POA: Diagnosis not present

## 2022-06-28 DIAGNOSIS — R87610 Atypical squamous cells of undetermined significance on cytologic smear of cervix (ASC-US): Secondary | ICD-10-CM

## 2022-06-28 DIAGNOSIS — Z124 Encounter for screening for malignant neoplasm of cervix: Secondary | ICD-10-CM | POA: Diagnosis not present

## 2022-06-28 NOTE — Progress Notes (Signed)
GYNECOLOGY  VISIT  CC:   repeat pap  HPI: 42 y.o. G0P0000 Single White or Caucasian female here for repeat Pap smear.  H/O ASCUS pap with +HR HPV obtained 03/08/2021.  Colposcopy did not show any abnormal findings and ECC was negative.  Pap was repeated 11/27/2021 and was negative with neg HR HPV.  Here for repeat pap smear.  Has Mirena IUD placed 11/04/2018.  Cycles are regular and last about 3 days.     Past Medical History:  Diagnosis Date   Back pain    GERD (gastroesophageal reflux disease)    resolved    H/O blood clots    High cholesterol    Hypertension    Hypothyroidism    Joint pain    PONV (postoperative nausea and vomiting)    mild nausea responsive to meds    Pulmonary embolism ([place]) 10/2017   Thyroid carcinoma ([place])    Vitamin D deficiency     MEDS:   Current Outpatient Medications on File Prior to Visit  Medication Sig Dispense Refill   apixaban (ELIQUIS) 5 MG TABS tablet Take 1 tablet (5 mg total) by mouth 2 (two) times daily. 180 tablet 3   buPROPion (WELLBUTRIN SR) 200 MG 12 hr tablet Take 1 tablet (200 mg total) by mouth every morning. 30 tablet 0   levonorgestrel (MIRENA) 20 MCG/24HR IUD 1 each by Intrauterine route once. Placed 11/14/18     levothyroxine (SYNTHROID) 50 MCG tablet Take by mouth 1.5 tablets alternating with 2 tablets every other day 90 tablet 3   metoprolol tartrate (LOPRESSOR) 25 MG tablet Take 0.5 tablets (12.5 mg total) by mouth 2 (two) times daily. 90 tablet 3   rosuvastatin (CRESTOR) 5 MG tablet Take 1 tablet (5 mg total) by mouth daily. 90 tablet 3   Vitamin D, Ergocalciferol, (DRISDOL) 1.25 MG (50000 UNIT) CAPS capsule Take 1 capsule (50,000 Units total) by mouth once a week. 4 capsule 0   No current facility-administered medications on file prior to visit.    ALLERGIES: Patient has no known allergies.  SH:  single, non smoker  Review of Systems  Constitutional: Negative.   Genitourinary: Negative.     PHYSICAL EXAMINATION:     BP 110/74   Pulse 71   Ht '5\' 7"'$  (1.702 m) Comment: Reported  Wt 282 lb 12.8 oz (128.3 kg)   BMI 44.29 kg/m     General appearance: alert, cooperative and appears stated age Lymph:  no inguinal LAD noted  Pelvic: External genitalia:  no lesions              Urethra:  normal appearing urethra with no masses, tenderness or lesions              Bartholins and Skenes: normal                 Vagina: normal appearing vagina with normal color and discharge, no lesions              Cervix: no lesions, IUD string noted, pap obtained               Chaperone, Octaviano Batty, CMA, was present for exam.  Assessment/Plan: 1. ASCUS with positive high risk HPV cervical - Cytology - PAP( [place]) - follow up will be planned pending pap smear results

## 2022-07-02 ENCOUNTER — Encounter (HOSPITAL_BASED_OUTPATIENT_CLINIC_OR_DEPARTMENT_OTHER): Payer: Self-pay | Admitting: Physical Therapy

## 2022-07-02 ENCOUNTER — Ambulatory Visit (HOSPITAL_BASED_OUTPATIENT_CLINIC_OR_DEPARTMENT_OTHER): Payer: BC Managed Care – PPO | Attending: Family Medicine | Admitting: Physical Therapy

## 2022-07-02 DIAGNOSIS — M6281 Muscle weakness (generalized): Secondary | ICD-10-CM

## 2022-07-02 DIAGNOSIS — M461 Sacroiliitis, not elsewhere classified: Secondary | ICD-10-CM | POA: Insufficient documentation

## 2022-07-02 DIAGNOSIS — M533 Sacrococcygeal disorders, not elsewhere classified: Secondary | ICD-10-CM

## 2022-07-02 DIAGNOSIS — R252 Cramp and spasm: Secondary | ICD-10-CM

## 2022-07-02 NOTE — Progress Notes (Signed)
Chief Complaint:   OBESITY Penny Hancock is here to discuss her progress with her obesity treatment plan along with follow-up of her obesity related diagnoses. Penny Hancock is on keeping a food journal and adhering to recommended goals of 1500 calories and 85+ grams of protein and states she is following her eating plan approximately 60% of the time. Penny Hancock states she is walking for 30 minutes 3 times per week.  Today's visit was #: 10 Starting weight: 300 lbs Starting date: 01/09/2022 Today's weight: 281 lbs Today's date: 06/19/2022 Total lbs lost to date: 19 Total lbs lost since last in-office visit: 0  Interim History: Penny Hancock has done well with minimizing holiday weight gain.  She is working on getting back on track with journaling and she is trying to increase her protein.  Subjective:   1. Vitamin D deficiency Penny Hancock is stable on vitamin D with no side effects noted.  2. Emotional Eating Behavior Penny Hancock has been doing well with decreasing emotional eating behaviors.  No side effects were noted with Wellbutrin.  3. Sacroiliitis ([place]) Penny Hancock notes increased pain around SI joint.  She is interested in starting physical therapy.  Assessment/Plan:   1. Vitamin D deficiency Penny Hancock will continue prescription vitamin D, and we will refill for 1 month.  - Vitamin D, Ergocalciferol, (DRISDOL) 1.25 MG (50000 UNIT) CAPS capsule; Take 1 capsule (50,000 Units total) by mouth once a week.  Dispense: 4 capsule; Refill: 0  2. Emotional Eating Behavior Penny Hancock will continue Wellbutrin SR, and we will refill for 1 month.  - buPROPion (WELLBUTRIN SR) 200 MG 12 hr tablet; Take 1 tablet (200 mg total) by mouth every morning.  Dispense: 30 tablet; Refill: 0  3. Sacroiliitis ([place]) Penny Hancock was referred to physical therapy at [facility] 2 times per week for 12 weeks.  - Ambulatory referral to Physical Therapy  4. Obesity, Current BMI 17.6 Penny Hancock is currently in the action stage of change. As such, her  goal is to continue with weight loss efforts. She has agreed to keeping a food journal and adhering to recommended goals of 1500 calories and 85+ grams of protein daily.   Exercise goals: As is.   Behavioral modification strategies: increasing lean protein intake and meal planning and cooking strategies.  Penny Hancock has agreed to follow-up with our clinic in 4 weeks. She was informed of the importance of frequent follow-up visits to maximize her success with intensive lifestyle modifications for her multiple health conditions.   Objective:   Blood pressure 116/79, pulse 67, height '5\' 7"'$  (1.702 m), weight 281 lb (127.5 kg), SpO2 99 %. Body mass index is 44.01 kg/m.  General: Cooperative, alert, well developed, in no acute distress. HEENT: Conjunctivae and lids unremarkable. Cardiovascular: Regular rhythm.  Lungs: Normal work of breathing. Neurologic: No focal deficits.   Lab Results  Component Value Date   CREATININE 0.80 01/09/2022   BUN 18 01/09/2022   NA 139 01/09/2022   K 4.8 01/09/2022   CL 103 01/09/2022   CO2 20 01/09/2022   Lab Results  Component Value Date   ALT 10 01/09/2022   AST 15 01/09/2022   ALKPHOS 63 01/09/2022   BILITOT 0.5 01/09/2022   Lab Results  Component Value Date   HGBA1C 5.1 07/10/2021   HGBA1C 5.0 08/17/2019   HGBA1C 5.1 04/30/2019   Lab Results  Component Value Date   INSULIN 8.0 01/09/2022   Lab Results  Component Value Date   TSH 1.27 03/20/2022   Lab Results  Component Value Date   CHOL 140 12/26/2021   HDL 54 12/26/2021   LDLCALC 72 12/26/2021   TRIG 63 12/26/2021   CHOLHDL 2.6 12/26/2021   Lab Results  Component Value Date   VD25OH 53.3 01/09/2022   VD25OH 59 07/10/2021   VD25OH 23 (L) 04/30/2019   Lab Results  Component Value Date   WBC 5.8 07/10/2021   HGB 14.9 07/10/2021   HCT 44.0 07/10/2021   MCV 95.9 07/10/2021   PLT 246 07/10/2021   No results found for: "IRON", "TIBC", "FERRITIN"  Attestation Statements:    Reviewed by clinician on day of visit: allergies, medications, problem list, medical history, surgical history, family history, social history, and previous encounter notes.   I, Trixie Dredge, am acting as transcriptionist for Dennard Nip, MD.  I have reviewed the above documentation for accuracy and completeness, and I agree with the above. -  Dennard Nip, MD

## 2022-07-02 NOTE — Therapy (Signed)
OUTPATIENT PHYSICAL THERAPY LOWER EXTREMITY TREATMENT   Patient Name: Penny Cribb, MD MRN: [id number] DOB:[birth year], 42 y.o., female Today's Date: 07/02/2022  END OF SESSION:  PT End of Session - 07/02/22 0829     Visit Number 2    Date for PT Re-Evaluation 08/17/22    Authorization Type BC/BS    PT Start Time 0815    PT Stop Time 3329    PT Time Calculation (min) 40 min             Past Medical History:  Diagnosis Date   Back pain    GERD (gastroesophageal reflux disease)    resolved    H/O blood clots    High cholesterol    Hypertension    Hypothyroidism    Joint pain    PONV (postoperative nausea and vomiting)    mild nausea responsive to meds    Pulmonary embolism ([place]) 10/2017   Thyroid carcinoma ([place])    Vitamin D deficiency    Past Surgical History:  Procedure Laterality Date   broken wrist     KNEE SURGERY     arthroscopy    THYROIDECTOMY Right 07/24/2018   Procedure: RIGHT THYROID LOBECTOMY  AND ISTHMUS;  Surgeon: Armandina Gemma, MD;  Location: [facility];  Service: General;  Laterality: Right;   Patient Active Problem List   Diagnosis Date Noted   Sacroiliitis ([place]) 06/19/2022   Stress 04/24/2022   Other specified hypothyroidism 04/02/2022   Depression 03/20/2022   Class 3 severe obesity with serious comorbidity and body mass index (BMI) of 45.0 to 49.9 in adult ([place]) 03/20/2022   Vitamin D deficiency 03/06/2022   History of pulmonary embolism 11/28/2021   Hypercholesterolemia 04/27/2021   Hypertensive disorder 04/27/2021   Shingles of eyelid 02/22/2020   Postsurgical hypothyroidism 10/28/2018   Papillary thyroid carcinoma ([place]) 10/28/2018    PCP: Elby Showers, MD  REFERRING PROVIDER: Starlyn Skeans, MD  REFERRING DIAG: M46.1 (ICD-10-CM) - Sacroiliitis ([place])  THERAPY DIAG:  SI (sacroiliac) pain  Muscle weakness (generalized)  Cramp and spasm  Rationale for Evaluation and Treatment: Rehabilitation  ONSET DATE: 6-8 months  ago  SUBJECTIVE:   SUBJECTIVE STATEMENT: Pt reports the HEP "really irritated" her SI.  She reports that bridging exercise tends to help.  She woke up with more pain, but she attributes it to moving/ lifting boxes.   PERTINENT HISTORY: bilat pulmonary embolism on 11/18/2017, thyroid cancer s/p right thyroidectomy 07/24/2018, elevated BMI, HTN  PAIN:  Are you having pain? Yes: NPRS scale: 4-5/10 Pain location: right SI Pain description: sore, occasional sharp pains with certain movements Aggravating factors: bending, sitting Relieving factors: standing, lying flat  PRECAUTIONS: None  WEIGHT BEARING RESTRICTIONS: No  FALLS:  Has patient fallen in last 6 months? No  LIVING ENVIRONMENT: Lives with: lives alone Lives in: House/apartment Stairs: Yes: Internal: 15 steps; can reach both and External: 3-4 steps; can reach both Has following equipment at home: Single point cane, Crutches, and None  OCCUPATION: Surgeon, does robotic surgery  PLOF: Independent, Vocation/Vocational requirements: sitting with robotic surgery, and Leisure: gardening  PATIENT GOALS: To no longer have pain.  NEXT MD VISIT: as needed  OBJECTIVE:   DIAGNOSTIC FINDINGS: n/a  PATIENT SURVEYS:  FOTO 46% (projected 68% by visit 13)  COGNITION: Overall cognitive status: Within functional limits for tasks assessed     SENSATION: Pt reports when she is on treadmill or elliptical, has numbness on 3rd and 4th toes (primarily on left)  MUSCLE  LENGTH: Hamstrings: Right 80 deg; Left 75 deg Chartrand test: increased tightness on right side  PALPATION: Tenderness to palpation along SI joint  LOWER EXTREMITY ROM: WFL  LOWER EXTREMITY MMT:  Left hip strength 4/5, Right is WFL   LOWER EXTREMITY SPECIAL TESTS:  Hip special tests: SI distraction test: positive   FUNCTIONAL TESTS:  06/21/2021: 30 seconds chair stand test 14 stands with discomfort in SI joint in left  GAIT: Distance walked: >500  ft Assistive device utilized: None Level of assistance: Complete Independence Comments: denies pain with in clinic ambulation   TODAY'S TREATMENT:                                                                                                                              Pt seen for aquatic therapy today.  Treatment took place in water 3.25-4.5 ft in depth at the [facility]. Temp of water was 91.  Pt entered/exited the pool via stairs independently with single rail. *intro to [employer] * walking forward/backward and side stepping without support * walking forward/backward with single yellow hand float at side with core engaged * side stepping with shoulder addct/abdct with rainbow hand floats * 1/2 diamond  holding noodle- switched to holding wall ( limited Lt hip ER)  * Holding wall:  single leg stance with clam, side to side lunge for adductor stretch, curtsy lunge, hip circles CW  * TrA set with solid noodle press;  bilat heel raises with UE on solid noodle * marching backward/ forward with arm swing * Quad stretch with foot on solid noodle x 15s then forward leans with foot supported by noodle for opp hamstring stretch x 5 * fig 4 stretch at stairs  Pt requires the buoyancy and hydrostatic pressure of water for support, and to offload joints by unweighting joint load by at least 50 % in navel deep water and by at least 75-80% in chest to neck deep water.  Viscosity of the water is needed for resistance of strengthening. Water current perturbations provides challenge to standing balance requiring increased core activation.    PATIENT EDUCATION:  Education details: Aquatics intro Person educated: Patient Education method: Education officer, environmental, Education comprehension: verbalized understanding  HOME EXERCISE PROGRAM: Access Code: [email or web address] URL: [email or web address] Date: 06/21/2022 Prepared by: Shelby Dubin Menke  Exercises - Clamshell  - 1 x daily -  7 x weekly - 2 sets - 10 reps - Sidelying Reverse Clamshell  - 1 x daily - 7 x weekly - 2 sets - 10 reps - Supine SI Joint Self-Correction  - 1 x daily - 7 x weekly - 2 sets - 5 reps - 5 sec hold - Psoas Mobilization with Small Ball  - 1 x daily - 7 x weekly - 2 sets - 10 reps - Half Kneeling Hip Flexor Stretch  - 1 x daily - 7 x weekly - 1 sets - 2 reps - 20 sec hold  ASSESSMENT:  CLINICAL  IMPRESSION: Pt observed with decreased Lt hip ER ROM.  She reported gradual reduction of pain in Rt SI to 0/10 while exercising in the water.  Will plan to work on strengthening posterior chain with hip hinges (UE on noodle), core strengthening and hip flexibility in future aquatic sessions.  Goals are ongoing.    Patient would benefit from skilled PT to address her functional impairments to allow her to return to her painfree lifestyle.  OBJECTIVE IMPAIRMENTS: decreased strength, increased muscle spasms, impaired flexibility, and pain.   ACTIVITY LIMITATIONS: bending and sitting  PARTICIPATION LIMITATIONS: community activity and occupation  PERSONAL FACTORS: Time since onset of injury/illness/exacerbation are also affecting patient's functional outcome.   REHAB POTENTIAL: Good  CLINICAL DECISION MAKING: Stable/uncomplicated  EVALUATION COMPLEXITY: Low   GOALS: Goals reviewed with patient? Yes  SHORT TERM GOALS: Target date: 07/12/2022 Pt will be independent with initial HEP. Baseline: Goal status: INITIAL  2.  Patient will report at least a 30% improvements in functional mobility. Baseline:  Goal status: INITIAL   LONG TERM GOALS: Target date: 08/17/2022  Patient will be independent with advanced HEP. Baseline:  Goal status: INITIAL  2.  Patient will be able to work a shift of work without increased SI pain. Baseline:  Goal status: INITIAL  3.  Patient will be able to exercise in the gym without increased SI pain. Baseline:  Goal status: INITIAL  4.  Patient will increase FOTO  to at least 68% to demonstrate improvements in functional mobility. Baseline: 46% Goal status: INITIAL  5.  Patient will increase left hip strength to at least 4+/5 to allow her to perform functional tasks with increased ease. Baseline:  Goal status: INITIAL   PLAN:  PT FREQUENCY: 1-2x/week  PT DURATION: 8 weeks  PLANNED INTERVENTIONS: Therapeutic exercises, Therapeutic activity, Neuromuscular re-education, Balance training, Gait training, Patient/Family education, Self Care, Joint mobilization, Joint manipulation, Stair training, Aquatic Therapy, Dry Needling, Electrical stimulation, Spinal manipulation, Spinal mobilization, Cryotherapy, Moist heat, Taping, Traction, Ultrasound, Ionotophoresis '4mg'$ /ml Dexamethasone, Manual therapy, and Re-evaluation  PLAN FOR NEXT SESSION: assess and progress HEP as indicated, aquatic PT, dry needling/manual therapy as indicated, core stability  Kerin Perna, PTA 07/02/22 10:45 AM [place], [place], [address] Phone: [phone]   Fax:  [phone]

## 2022-07-03 LAB — CYTOLOGY - PAP
Comment: NEGATIVE
Diagnosis: NEGATIVE
Diagnosis: REACTIVE
High risk HPV: NEGATIVE

## 2022-07-10 ENCOUNTER — Ambulatory Visit (HOSPITAL_BASED_OUTPATIENT_CLINIC_OR_DEPARTMENT_OTHER): Payer: BC Managed Care – PPO | Admitting: Physical Therapy

## 2022-07-16 ENCOUNTER — Ambulatory Visit (HOSPITAL_BASED_OUTPATIENT_CLINIC_OR_DEPARTMENT_OTHER): Payer: BC Managed Care – PPO | Admitting: Physical Therapy

## 2022-07-16 ENCOUNTER — Encounter: Payer: Self-pay | Admitting: Rehabilitative and Restorative Service Providers"

## 2022-07-16 ENCOUNTER — Ambulatory Visit: Payer: BC Managed Care – PPO | Admitting: Rehabilitative and Restorative Service Providers"

## 2022-07-16 DIAGNOSIS — M533 Sacrococcygeal disorders, not elsewhere classified: Secondary | ICD-10-CM

## 2022-07-16 DIAGNOSIS — M6281 Muscle weakness (generalized): Secondary | ICD-10-CM | POA: Diagnosis not present

## 2022-07-16 DIAGNOSIS — M461 Sacroiliitis, not elsewhere classified: Secondary | ICD-10-CM | POA: Diagnosis not present

## 2022-07-16 DIAGNOSIS — R252 Cramp and spasm: Secondary | ICD-10-CM

## 2022-07-16 NOTE — Therapy (Signed)
OUTPATIENT PHYSICAL THERAPY TREATMENT NOTE   Patient Name: Penny Fauth, MD MRN: [id number] DOB:1980-06-21, 42 y.o., female Today's Date: 07/16/2022  END OF SESSION:  PT End of Session - 07/16/22 0840     Visit Number 3    Date for PT Re-Evaluation 08/17/22    Authorization Type BC/BS    PT Start Time 0839    PT Stop Time 0920    PT Time Calculation (min) 41 min    Activity Tolerance Patient tolerated treatment well    Behavior During Therapy [facility] for tasks assessed/performed             Past Medical History:  Diagnosis Date   Back pain    GERD (gastroesophageal reflux disease)    resolved    H/O blood clots    High cholesterol    Hypertension    Hypothyroidism    Joint pain    PONV (postoperative nausea and vomiting)    mild nausea responsive to meds    Pulmonary embolism ([place]) 10/2017   Thyroid carcinoma ([place])    Vitamin D deficiency    Past Surgical History:  Procedure Laterality Date   broken wrist     KNEE SURGERY     arthroscopy    THYROIDECTOMY Right 07/24/2018   Procedure: RIGHT THYROID LOBECTOMY  AND ISTHMUS;  Surgeon: Armandina Gemma, MD;  Location: [facility];  Service: General;  Laterality: Right;   Patient Active Problem List   Diagnosis Date Noted   Sacroiliitis ([place]) 06/19/2022   Stress 04/24/2022   Other specified hypothyroidism 04/02/2022   Depression 03/20/2022   Class 3 severe obesity with serious comorbidity and body mass index (BMI) of 45.0 to 49.9 in adult ([place]) 03/20/2022   Vitamin D deficiency 03/06/2022   History of pulmonary embolism 11/28/2021   Hypercholesterolemia 04/27/2021   Hypertensive disorder 04/27/2021   Shingles of eyelid 02/22/2020   Postsurgical hypothyroidism 10/28/2018   Papillary thyroid carcinoma ([place]) 10/28/2018    PCP: Elby Showers, MD  REFERRING PROVIDER: Starlyn Skeans, MD  REFERRING DIAG: M46.1 (ICD-10-CM) - Sacroiliitis ([place])  THERAPY DIAG:  SI (sacroiliac) pain  Muscle weakness  (generalized)  Cramp and spasm  Rationale for Evaluation and Treatment: Rehabilitation  ONSET DATE: 6-8 months ago  SUBJECTIVE:   SUBJECTIVE STATEMENT: Pt reports that the pool seemed to help her pain lessen.  Patient states that some of the stretches do seem to be helping her.  States that she was on call this weekend.  PERTINENT HISTORY: bilat pulmonary embolism on 11/18/2017, thyroid cancer s/p right thyroidectomy 07/24/2018, elevated BMI, HTN  PAIN:  Are you having pain? Yes: NPRS scale: 5-6/10 Pain location: right SI Pain description: sore, occasional sharp pains with certain movements Aggravating factors: bending, sitting Relieving factors: standing, lying flat  PRECAUTIONS: None  WEIGHT BEARING RESTRICTIONS: No  FALLS:  Has patient fallen in last 6 months? No  LIVING ENVIRONMENT: Lives with: lives alone Lives in: House/apartment Stairs: Yes: Internal: 15 steps; can reach both and External: 3-4 steps; can reach both Has following equipment at home: Single point cane, Crutches, and None  OCCUPATION: Surgeon, does robotic surgery  PLOF: Independent, Vocation/Vocational requirements: sitting with robotic surgery, and Leisure: gardening  PATIENT GOALS: To no longer have pain.  NEXT MD VISIT: as needed  OBJECTIVE:   DIAGNOSTIC FINDINGS: n/a  PATIENT SURVEYS:  FOTO 46% (projected 68% by visit 13)  COGNITION: Overall cognitive status: Within functional limits for tasks assessed     SENSATION: Pt reports  when she is on treadmill or elliptical, has numbness on 3rd and 4th toes (primarily on left)  MUSCLE LENGTH: Hamstrings: Right 80 deg; Left 75 deg Yasin test: increased tightness on right side  PALPATION: Tenderness to palpation along SI joint  LOWER EXTREMITY ROM: WFL  LOWER EXTREMITY MMT:  Left hip strength 4/5, Right is WFL   LOWER EXTREMITY SPECIAL TESTS:  Hip special tests: SI distraction test: positive   FUNCTIONAL TESTS:  06/21/2021: 30  seconds chair stand test 14 stands with discomfort in SI joint in left  GAIT: Distance walked: >500 ft Assistive device utilized: None Level of assistance: Complete Independence Comments: denies pain with in clinic ambulation   TODAY'S TREATMENT:    DATE:  07/16/2022  Standing hamstring stretch at step 2x20 sec bilat Standing hip flexor stretch with reach at step x10 bilat Nustep level 1 x5 min with PT present to discuss status Supine SI joint correction x10 bilat Sidelying clamshell and reverse clamshell x10 bilat Lower trunk rotation x10 bilat Trigger Point Dry-Needling  Treatment instructions: Expect mild to moderate muscle soreness. S/S of pneumothorax if dry needled over a lung field, and to seek immediate medical attention should they occur. Patient verbalized understanding of these instructions and education. Patient Consent Given: Yes Education handout provided: Previously provided Muscles treated: Left lumbar multifidi, bilateral piriformis Electrical stimulation performed: No Parameters: N/A Treatment response/outcome: Utilized skilled palpation to identify trigger points.  Able to palpate twitch response and muscle elongation following. Manual Therapy:  Addaday to bilateral lumbar paraspinals, glutes/piriformis, hamstrings, and IT band Seated piriformis stretch 2x20 sec bilat Seated pelvic tilt x10   DATE:  07/02/2022                                                                                                                            Pt seen for aquatic therapy today.  Treatment took place in water 3.25-4.5 ft in depth at the [facility]. Temp of water was 91.  Pt entered/exited the pool via stairs independently with single rail. *intro to [employer] * walking forward/backward and side stepping without support * walking forward/backward with single yellow hand float at side with core engaged * side stepping with shoulder addct/abdct with rainbow hand  floats * 1/2 diamond  holding noodle- switched to holding wall ( limited Lt hip ER)  * Holding wall:  single leg stance with clam, side to side lunge for adductor stretch, curtsy lunge, hip circles CW  * TrA set with solid noodle press;  bilat heel raises with UE on solid noodle * marching backward/ forward with arm swing * Quad stretch with foot on solid noodle x 15s then forward leans with foot supported by noodle for opp hamstring stretch x 5 * fig 4 stretch at stairs  Pt requires the buoyancy and hydrostatic pressure of water for support, and to offload joints by unweighting joint load by at least 50 % in navel deep water and by at  least 75-80% in chest to neck deep water.  Viscosity of the water is needed for resistance of strengthening. Water current perturbations provides challenge to standing balance requiring increased core activation.    PATIENT EDUCATION:  Education details: Aquatics intro Person educated: Patient Education method: Education officer, environmental, Education comprehension: verbalized understanding  HOME EXERCISE PROGRAM: Access Code: [email or web address] URL: [email or web address] Date: 07/16/2022 Prepared by: Shelby Dubin Tiphani Mells  Exercises - Clamshell  - 1 x daily - 7 x weekly - 2 sets - 10 reps - Sidelying Reverse Clamshell  - 1 x daily - 7 x weekly - 2 sets - 10 reps - Supine SI Joint Self-Correction  - 1 x daily - 7 x weekly - 2 sets - 5 reps - 5 sec hold - Psoas Mobilization with Small Ball  - 1 x daily - 7 x weekly - 2 sets - 10 reps - Seated Pelvic Tilt  - 1 x daily - 7 x weekly - 2 sets - 10 reps - Seated Piriformis Stretch  - 1 x daily - 7 x weekly - 1 sets - 2 reps - 20 sec hold - Half Kneeling Hip Flexor Stretch  - 1 x daily - 7 x weekly - 1 sets - 2 reps - 20 sec hold - Standing Hamstring Stretch on Chair  - 1 x daily - 7 x weekly - 1 sets - 2 reps - 20 sec hold  ASSESSMENT:  CLINICAL IMPRESSION: Patient presents to skilled PT reporting that she is  feeling some relief from pain with the stretches, but continues to have pain.  Patient states that she would like to try dry needling today.  Patient with good response to dry needling, reporting pain following dry needling and manual therapy decreased to 1-2/10 when ambulating around PT gym.  Patient with good response to seated piriformis stretch and added to HEP.  Patient to have aquatic PT session tomorrow.  OBJECTIVE IMPAIRMENTS: decreased strength, increased muscle spasms, impaired flexibility, and pain.   ACTIVITY LIMITATIONS: bending and sitting  PARTICIPATION LIMITATIONS: community activity and occupation  PERSONAL FACTORS: Time since onset of injury/illness/exacerbation are also affecting patient's functional outcome.   REHAB POTENTIAL: Good  CLINICAL DECISION MAKING: Stable/uncomplicated  EVALUATION COMPLEXITY: Low   GOALS: Goals reviewed with patient? Yes  SHORT TERM GOALS: Target date: 07/12/2022 Pt will be independent with initial HEP. Baseline: Goal status: MET  2.  Patient will report at least a 30% improvements in functional mobility. Baseline:  Goal status: IN PROGRESS   LONG TERM GOALS: Target date: 08/17/2022  Patient will be independent with advanced HEP. Baseline:  Goal status: INITIAL  2.  Patient will be able to work a shift of work without increased SI pain. Baseline:  Goal status: INITIAL  3.  Patient will be able to exercise in the gym without increased SI pain. Baseline:  Goal status: INITIAL  4.  Patient will increase FOTO to at least 68% to demonstrate improvements in functional mobility. Baseline: 46% Goal status: INITIAL  5.  Patient will increase left hip strength to at least 4+/5 to allow her to perform functional tasks with increased ease. Baseline:  Goal status: INITIAL   PLAN:  PT FREQUENCY: 1-2x/week  PT DURATION: 8 weeks  PLANNED INTERVENTIONS: Therapeutic exercises, Therapeutic activity, Neuromuscular re-education,  Balance training, Gait training, Patient/Family education, Self Care, Joint mobilization, Joint manipulation, Stair training, Aquatic Therapy, Dry Needling, Electrical stimulation, Spinal manipulation, Spinal mobilization, Cryotherapy, Moist heat, Taping, Traction, Ultrasound, Ionotophoresis '4mg'$ /ml Dexamethasone, Manual therapy, and Re-evaluation  PLAN FOR NEXT SESSION: assess and progress HEP as indicated, aquatic PT, dry needling/manual therapy as indicated, core stability   Juel Burrow, PT 07/16/22 9:35 AM  [facility] [address], [place], [place] [address] Phone # [phone] Fax [phone]

## 2022-07-17 ENCOUNTER — Encounter (HOSPITAL_BASED_OUTPATIENT_CLINIC_OR_DEPARTMENT_OTHER): Payer: Self-pay | Admitting: Physical Therapy

## 2022-07-17 ENCOUNTER — Ambulatory Visit (INDEPENDENT_AMBULATORY_CARE_PROVIDER_SITE_OTHER): Payer: BC Managed Care – PPO | Admitting: Family Medicine

## 2022-07-17 ENCOUNTER — Encounter (INDEPENDENT_AMBULATORY_CARE_PROVIDER_SITE_OTHER): Payer: Self-pay | Admitting: Family Medicine

## 2022-07-17 ENCOUNTER — Ambulatory Visit (HOSPITAL_BASED_OUTPATIENT_CLINIC_OR_DEPARTMENT_OTHER): Payer: BC Managed Care – PPO | Admitting: Physical Therapy

## 2022-07-17 VITALS — BP 117/82 | HR 63 | Temp 97.9°F | Ht 67.0 in | Wt 272.0 lb

## 2022-07-17 DIAGNOSIS — R252 Cramp and spasm: Secondary | ICD-10-CM

## 2022-07-17 DIAGNOSIS — E88819 Insulin resistance, unspecified: Secondary | ICD-10-CM | POA: Insufficient documentation

## 2022-07-17 DIAGNOSIS — E669 Obesity, unspecified: Secondary | ICD-10-CM | POA: Diagnosis not present

## 2022-07-17 DIAGNOSIS — F3289 Other specified depressive episodes: Secondary | ICD-10-CM

## 2022-07-17 DIAGNOSIS — M6281 Muscle weakness (generalized): Secondary | ICD-10-CM

## 2022-07-17 DIAGNOSIS — Z6841 Body Mass Index (BMI) 40.0 and over, adult: Secondary | ICD-10-CM | POA: Diagnosis not present

## 2022-07-17 DIAGNOSIS — M533 Sacrococcygeal disorders, not elsewhere classified: Secondary | ICD-10-CM

## 2022-07-17 DIAGNOSIS — Z7984 Long term (current) use of oral hypoglycemic drugs: Secondary | ICD-10-CM

## 2022-07-17 DIAGNOSIS — M461 Sacroiliitis, not elsewhere classified: Secondary | ICD-10-CM | POA: Diagnosis not present

## 2022-07-17 MED ORDER — METFORMIN HCL 500 MG PO TABS: 500.0000 mg | ORAL_TABLET | Freq: Every day | ORAL | 0 refills | Status: DC

## 2022-07-17 MED ORDER — BUPROPION HCL ER (SR) 200 MG PO TB12: 200.0000 mg | ORAL_TABLET | Freq: Every morning | ORAL | 0 refills | Status: DC

## 2022-07-17 NOTE — Therapy (Signed)
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 DOB:08-06-1980, 42 y.o., female Today's Date: 07/17/2022  END OF SESSION:  PT End of Session - 07/17/22 1557     Visit Number 4    Date for PT Re-Evaluation 08/17/22    Authorization Type BC/BS    PT Start Time 1602    PT Stop Time 1645    PT Time Calculation (min) 43 min    Activity Tolerance Patient tolerated treatment well    Behavior During Therapy WFL for tasks assessed/performed             Past Medical History:  Diagnosis Date   Back pain    GERD (gastroesophageal reflux disease)    resolved    H/O blood clots    High cholesterol    Hypertension    Hypothyroidism    Joint pain    PONV (postoperative nausea and vomiting)    mild nausea responsive to meds    Pulmonary embolism ([place]) 10/2017   Thyroid carcinoma ([place])    Vitamin D deficiency    Past Surgical History:  Procedure Laterality Date   broken wrist     KNEE SURGERY     arthroscopy    THYROIDECTOMY Right 07/24/2018   Procedure: RIGHT THYROID LOBECTOMY  AND ISTHMUS;  Surgeon: Armandina Gemma, MD;  Location: [facility];  Service: General;  Laterality: Right;   Patient Active Problem List   Diagnosis Date Noted   Insulin resistance 07/17/2022   BMI 40.0-44.9, adult ([place]) 07/17/2022   Obesity, Beginning BMI 46.99 07/17/2022   Sacroiliitis ([place]) 06/19/2022   Stress 04/24/2022   Other specified hypothyroidism 04/02/2022   Depression 03/20/2022   Class 3 severe obesity with serious comorbidity and body mass index (BMI) of 45.0 to 49.9 in adult ([place]) 03/20/2022   Vitamin D deficiency 03/06/2022   History of pulmonary embolism 11/28/2021   Hypercholesterolemia 04/27/2021   Hypertensive disorder 04/27/2021   Shingles of eyelid 02/22/2020   Postsurgical hypothyroidism 10/28/2018   Papillary thyroid carcinoma ([place]) 10/28/2018    PCP: Elby Showers, MD  REFERRING PROVIDER: Starlyn Skeans,  MD  REFERRING DIAG: M46.1 (ICD-10-CM) - Sacroiliitis ([place])  THERAPY DIAG:  SI (sacroiliac) pain  Muscle weakness (generalized)  Cramp and spasm  Rationale for Evaluation and Treatment: Rehabilitation  ONSET DATE: 6-8 months ago  SUBJECTIVE:   SUBJECTIVE STATEMENT: Pt reports DN helped pain a lot.  Pain reduced since.  PERTINENT HISTORY: bilat pulmonary embolism on 11/18/2017, thyroid cancer s/p right thyroidectomy 07/24/2018, elevated BMI, HTN  PAIN:  Are you having pain? Yes: NPRS scale: 1-2/10 Pain location: right SI Pain description: sore, occasional sharp pains with certain movements Aggravating factors: bending, sitting Relieving factors: standing, lying flat  PRECAUTIONS: None  WEIGHT BEARING RESTRICTIONS: No  FALLS:  Has patient fallen in last 6 months? No  LIVING ENVIRONMENT: Lives with: lives alone Lives in: House/apartment Stairs: Yes: Internal: 15 steps; can reach both and External: 3-4 steps; can reach both Has following equipment at home: Single point cane, Crutches, and None  OCCUPATION: Surgeon, does robotic surgery  PLOF: Independent, Vocation/Vocational requirements: sitting with robotic surgery, and Leisure: gardening  PATIENT GOALS: To no longer have pain.  NEXT MD VISIT: as needed  OBJECTIVE:   DIAGNOSTIC FINDINGS: n/a  PATIENT SURVEYS:  FOTO 46% (projected 68% by visit 13)  COGNITION: Overall cognitive status: Within functional limits for tasks assessed     SENSATION: Pt reports when she is on  treadmill or elliptical, has numbness on 3rd and 4th toes (primarily on left)  MUSCLE LENGTH: Hamstrings: Right 80 deg; Left 75 deg [place] test: increased tightness on right side  PALPATION: Tenderness to palpation along SI joint  LOWER EXTREMITY ROM: WFL  LOWER EXTREMITY MMT:  Left hip strength 4/5, Right is WFL   LOWER EXTREMITY SPECIAL TESTS:  Hip special tests: SI distraction test: positive   FUNCTIONAL TESTS:  06/21/2021: 30  seconds chair stand test 14 stands with discomfort in SI joint in left  GAIT: Distance walked: >500 ft Assistive device utilized: None Level of assistance: Complete Independence Comments: denies pain with in clinic ambulation   TODAY'S TREATMENT:    DATE:  07/17/2022                                                                                                                            Pt seen for aquatic therapy today.  Treatment took place in water 3.25-4.5 ft in depth at the [facility]. Temp of water was 91.  Pt entered/exited the pool via stairs independently with single rail.  * walking forward/backward and side stepping without support *hip flex and quad stretch standing using noodle *Hamstring, gastroc, adductor and IT band stretching standing using noodle * walking forward/backward with single yellow hand float at side with core engaged *STS 3rd water step with arm reach forward cues for abd bracing and pelvic floor contraction *Seated piriformis stretch 3rd step *Plank with hip extension glute contraction end range * holding wall 1/2 diamond and reverse 1/2 diamond( improved left hip ext rot) *L stretch *Seated on yellow noodle: ant/posterior pelvic tilts; hip hiking R/L; rotation R and L *side lunge with ue add/abd using yellow hand buoys 4 widths.  Pt requires the buoyancy and hydrostatic pressure of water for support, and to offload joints by unweighting joint load by at least 50 % in navel deep water and by at least 75-80% in chest to neck deep water.  Viscosity of the water is needed for resistance of strengthening. Water current perturbations provides challenge to standing balance requiring increased core activation.     DATE:  07/16/2022  Standing hamstring stretch at step 2x20 sec bilat Standing hip flexor stretch with reach at step x10 bilat Nustep level 1 x5 min with PT present to discuss status Supine SI joint correction x10 bilat Sidelying  clamshell and reverse clamshell x10 bilat Lower trunk rotation x10 bilat Trigger Point Dry-Needling  Treatment instructions: Expect mild to moderate muscle soreness. S/S of pneumothorax if dry needled over a lung field, and to seek immediate medical attention should they occur. Patient verbalized understanding of these instructions and education. Patient Consent Given: Yes Education handout provided: Previously provided Muscles treated: Left lumbar multifidi, bilateral piriformis Electrical stimulation performed: No Parameters: N/A Treatment response/outcome: Utilized skilled palpation to identify trigger points.  Able to palpate twitch response and muscle elongation following. Manual Therapy:  Addaday to bilateral lumbar paraspinals, glutes/piriformis,  hamstrings, and IT band Seated piriformis stretch 2x20 sec bilat Seated pelvic tilt x10   DATE:  07/02/2022                                                                                                                            Pt seen for aquatic therapy today.  Treatment took place in water 3.25-4.5 ft in depth at the [facility]. Temp of water was 91.  Pt entered/exited the pool via stairs independently with single rail. *intro to [employer] * walking forward/backward and side stepping without support * walking forward/backward with single yellow hand float at side with core engaged * side stepping with shoulder addct/abdct with rainbow hand floats * 1/2 diamond  holding noodle- switched to holding wall ( limited Lt hip ER)  * Holding wall:  single leg stance with clam, side to side lunge for adductor stretch, curtsy lunge, hip circles CW  * TrA set with solid noodle press;  bilat heel raises with UE on solid noodle * marching backward/ forward with arm swing * Quad stretch with foot on solid noodle x 15s then forward leans with foot supported by noodle for opp hamstring stretch x 5 * fig 4 stretch at stairs  Pt requires  the buoyancy and hydrostatic pressure of water for support, and to offload joints by unweighting joint load by at least 50 % in navel deep water and by at least 75-80% in chest to neck deep water.  Viscosity of the water is needed for resistance of strengthening. Water current perturbations provides challenge to standing balance requiring increased core activation.    PATIENT EDUCATION:  Education details: Aquatics intro Person educated: Patient Education method: Education officer, environmental, Education comprehension: verbalized understanding  HOME EXERCISE PROGRAM: Access Code: [email or web address] URL: [email or web address] Date: 07/16/2022 Prepared by: Shelby Dubin Menke  Exercises - Clamshell  - 1 x daily - 7 x weekly - 2 sets - 10 reps - Sidelying Reverse Clamshell  - 1 x daily - 7 x weekly - 2 sets - 10 reps - Supine SI Joint Self-Correction  - 1 x daily - 7 x weekly - 2 sets - 5 reps - 5 sec hold - Psoas Mobilization with Small Ball  - 1 x daily - 7 x weekly - 2 sets - 10 reps - Seated Pelvic Tilt  - 1 x daily - 7 x weekly - 2 sets - 10 reps - Seated Piriformis Stretch  - 1 x daily - 7 x weekly - 1 sets - 2 reps - 20 sec hold - Half Kneeling Hip Flexor Stretch  - 1 x daily - 7 x weekly - 1 sets - 2 reps - 20 sec hold - Standing Hamstring Stretch on Chair  - 1 x daily - 7 x weekly - 1 sets - 2 reps - 20 sec hold  ASSESSMENT:  CLINICAL IMPRESSION: Pt reports compliance with HEP instructed.  Much relief from DN yesterday 5/10 pain decreased to  1-2/10. Visually she has improved left hip ER equal to contralateral side. No complaints of pain throughout session other than minor left buttock discomfort with amb backwards.  She ended in [place] (unbilled) for warm water massage.  She is instructed on positioning to allow piriformis exposure toward jet.  Tolerate session very well.  Goals ongoing.  Patient presents to skilled PT reporting that she is feeling some relief from pain with the  stretches, but continues to have pain.  Patient states that she would like to try dry needling today.  Patient with good response to dry needling, reporting pain following dry needling and manual therapy decreased to 1-2/10 when ambulating around PT gym.  Patient with good response to seated piriformis stretch and added to HEP.  Patient to have aquatic PT session tomorrow.  OBJECTIVE IMPAIRMENTS: decreased strength, increased muscle spasms, impaired flexibility, and pain.   ACTIVITY LIMITATIONS: bending and sitting  PARTICIPATION LIMITATIONS: community activity and occupation  PERSONAL FACTORS: Time since onset of injury/illness/exacerbation are also affecting patient's functional outcome.   REHAB POTENTIAL: Good  CLINICAL DECISION MAKING: Stable/uncomplicated  EVALUATION COMPLEXITY: Low   GOALS: Goals reviewed with patient? Yes  SHORT TERM GOALS: Target date: 07/12/2022 Pt will be independent with initial HEP. Baseline: Goal status: MET  2.  Patient will report at least a 30% improvements in functional mobility. Baseline:  Goal status: IN PROGRESS   LONG TERM GOALS: Target date: 08/17/2022  Patient will be independent with advanced HEP. Baseline:  Goal status: INITIAL  2.  Patient will be able to work a shift of work without increased SI pain. Baseline:  Goal status: INITIAL  3.  Patient will be able to exercise in the gym without increased SI pain. Baseline:  Goal status: INITIAL  4.  Patient will increase FOTO to at least 68% to demonstrate improvements in functional mobility. Baseline: 46% Goal status: INITIAL  5.  Patient will increase left hip strength to at least 4+/5 to allow her to perform functional tasks with increased ease. Baseline:  Goal status: INITIAL   PLAN:  PT FREQUENCY: 1-2x/week  PT DURATION: 8 weeks  PLANNED INTERVENTIONS: Therapeutic exercises, Therapeutic activity, Neuromuscular re-education, Balance training, Gait training,  Patient/Family education, Self Care, Joint mobilization, Joint manipulation, Stair training, Aquatic Therapy, Dry Needling, Electrical stimulation, Spinal manipulation, Spinal mobilization, Cryotherapy, Moist heat, Taping, Traction, Ultrasound, Ionotophoresis '4mg'$ /ml Dexamethasone, Manual therapy, and Re-evaluation  PLAN FOR NEXT SESSION: assess and progress HEP as indicated, aquatic PT, dry needling/manual therapy as indicated, core stability   Stanton Kidney Tharon Aquas) Sahory Nordling MPT 07/17/22 4:05 PM

## 2022-07-25 ENCOUNTER — Ambulatory Visit (HOSPITAL_BASED_OUTPATIENT_CLINIC_OR_DEPARTMENT_OTHER): Payer: BC Managed Care – PPO | Attending: Family Medicine | Admitting: Physical Therapy

## 2022-07-25 ENCOUNTER — Encounter (HOSPITAL_BASED_OUTPATIENT_CLINIC_OR_DEPARTMENT_OTHER): Payer: Self-pay | Admitting: Physical Therapy

## 2022-07-25 ENCOUNTER — Encounter: Payer: Self-pay | Admitting: Internal Medicine

## 2022-07-25 ENCOUNTER — Telehealth: Payer: Self-pay | Admitting: Internal Medicine

## 2022-07-25 DIAGNOSIS — M6281 Muscle weakness (generalized): Secondary | ICD-10-CM | POA: Diagnosis not present

## 2022-07-25 DIAGNOSIS — M533 Sacrococcygeal disorders, not elsewhere classified: Secondary | ICD-10-CM | POA: Diagnosis not present

## 2022-07-25 DIAGNOSIS — R252 Cramp and spasm: Secondary | ICD-10-CM | POA: Diagnosis not present

## 2022-07-25 MED ORDER — AZITHROMYCIN 250 MG PO TABS: ORAL_TABLET | ORAL | 0 refills | Status: AC

## 2022-07-25 NOTE — Therapy (Signed)
OUTPATIENT PHYSICAL THERAPY TREATMENT NOTE   Patient Name: Penny Lender, MD MRN: [id number] DOB:[birth year], 42 y.o., female Today's Date: 07/25/2022  END OF SESSION:    Past Medical History:  Diagnosis Date   Back pain    GERD (gastroesophageal reflux disease)    resolved    H/O blood clots    High cholesterol    Hypertension    Hypothyroidism    Joint pain    PONV (postoperative nausea and vomiting)    mild nausea responsive to meds    Pulmonary embolism ([place]) 10/2017   Thyroid carcinoma ([place])    Vitamin D deficiency    Past Surgical History:  Procedure Laterality Date   broken wrist     KNEE SURGERY     arthroscopy    THYROIDECTOMY Right 07/24/2018   Procedure: RIGHT THYROID LOBECTOMY  AND ISTHMUS;  Surgeon: Armandina Gemma, MD;  Location: [facility];  Service: General;  Laterality: Right;   Patient Active Problem List   Diagnosis Date Noted   Insulin resistance 07/17/2022   BMI 40.0-44.9, adult ([place]) 07/17/2022   Obesity, Beginning BMI 46.99 07/17/2022   Sacroiliitis ([place]) 06/19/2022   Stress 04/24/2022   Other specified hypothyroidism 04/02/2022   Depression 03/20/2022   Class 3 severe obesity with serious comorbidity and body mass index (BMI) of 45.0 to 49.9 in adult ([place]) 03/20/2022   Vitamin D deficiency 03/06/2022   History of pulmonary embolism 11/28/2021   Hypercholesterolemia 04/27/2021   Hypertensive disorder 04/27/2021   Shingles of eyelid 02/22/2020   Postsurgical hypothyroidism 10/28/2018   Papillary thyroid carcinoma ([place]) 10/28/2018    PCP: Elby Showers, MD  REFERRING PROVIDER: Starlyn Skeans, MD  REFERRING DIAG: M46.1 (ICD-10-CM) - Sacroiliitis ([place])  THERAPY DIAG:  Muscle weakness (generalized)  Cramp and spasm  SI (sacroiliac) pain  Rationale for Evaluation and Treatment: Rehabilitation  ONSET DATE: 6-8 months ago  SUBJECTIVE:   SUBJECT:  Moved yesterday and my back is a little sore/stiff.  No SI pain  PERTINENT  HISTORY: bilat pulmonary embolism on 11/18/2017, thyroid cancer s/p right thyroidectomy 07/24/2018, elevated BMI, HTN  PAIN:  Are you having pain? Yes: NPRS scale: 0/10 Pain location: right SI Pain description: sore, occasional sharp pains with certain movements Aggravating factors: bending, sitting Relieving factors: standing, lying flat  PRECAUTIONS: None  WEIGHT BEARING RESTRICTIONS: No  FALLS:  Has patient fallen in last 6 months? No  LIVING ENVIRONMENT: Lives with: lives alone Lives in: House/apartment Stairs: Yes: Internal: 15 steps; can reach both and External: 3-4 steps; can reach both Has following equipment at home: Single point cane, Crutches, and None  OCCUPATION: Surgeon, does robotic surgery  PLOF: Independent, Vocation/Vocational requirements: sitting with robotic surgery, and Leisure: gardening  PATIENT GOALS: To no longer have pain.  NEXT MD VISIT: as needed  OBJECTIVE:   DIAGNOSTIC FINDINGS: n/a  PATIENT SURVEYS:  FOTO 46% (projected 68% by visit 13)  COGNITION: Overall cognitive status: Within functional limits for tasks assessed     SENSATION: Pt reports when she is on treadmill or elliptical, has numbness on 3rd and 4th toes (primarily on left)  MUSCLE LENGTH: Hamstrings: Right 80 deg; Left 75 deg [place] test: increased tightness on right side  PALPATION: Tenderness to palpation along SI joint  LOWER EXTREMITY ROM: WFL  LOWER EXTREMITY MMT:  Left hip strength 4/5, Right is [facility]   LOWER EXTREMITY SPECIAL TESTS:  Hip special tests: SI distraction test: positive   FUNCTIONAL TESTS:  06/21/2021: 30 seconds chair  stand test 14 stands with discomfort in SI joint in left  GAIT: Distance walked: >500 ft Assistive device utilized: None Level of assistance: Complete Independence Comments: denies pain with in clinic ambulation   TODAY'S TREATMENT:     DATE:  07/17/2022                                                                                                                             Pt seen for aquatic therapy today.  Treatment took place in water 3.25-4.5 ft in depth at the [facility]. Temp of water was 91.  Pt entered/exited the pool via stairs independently with single rail.  * walking forward/backward and side stepping without support *L stretch then L stretch with tail wagging *hip flex and quad stretch standing using noodle *Hamstring, gastroc, adductor and IT band stretching standing using noodle  *Plank with hip extension glute contraction end range *KB press 5 x 10s *KB row feet staggered then wide stance x 12 reps ea position KB resisted core rotation R/L x 8 Core strengthening using hand bells with oscillations in sagittal and frontal planes x5-10 quick then slow 2-3 sets in ea position * walking forward/backward with single yellow hand float at side with core engaged * holding wall 1/2 diamond and reverse 1/2 diamond (slight limited in R external hip rotation today)   Pt requires the buoyancy and hydrostatic pressure of water for support, and to offload joints by unweighting joint load by at least 50 % in navel deep water and by at least 75-80% in chest to neck deep water.  Viscosity of the water is needed for resistance of strengthening. Water current perturbations provides challenge to standing balance requiring increased core activation.    DATE:  07/17/2022                                                                                                                            Pt seen for aquatic therapy today.  Treatment took place in water 3.25-4.5 ft in depth at the [facility]. Temp of water was 91.  Pt entered/exited the pool via stairs independently with single rail.  * walking forward/backward and side stepping without support *hip flex and quad stretch standing using noodle *Hamstring, gastroc, adductor and IT band stretching standing using noodle * walking  forward/backward with single yellow hand float at side with core engaged *STS 3rd water step with  arm reach forward cues for abd bracing and pelvic floor contraction *Seated piriformis stretch 3rd step *Plank with hip extension glute contraction end range * holding wall 1/2 diamond and reverse 1/2 diamond( improved left hip ext rot) *L stretch *Seated on yellow noodle: ant/posterior pelvic tilts; hip hiking R/L; rotation R and L *side lunge with ue add/abd using yellow hand buoys 4 widths.  Pt requires the buoyancy and hydrostatic pressure of water for support, and to offload joints by unweighting joint load by at least 50 % in navel deep water and by at least 75-80% in chest to neck deep water.  Viscosity of the water is needed for resistance of strengthening. Water current perturbations provides challenge to standing balance requiring increased core activation.     DATE:  07/16/2022  Standing hamstring stretch at step 2x20 sec bilat Standing hip flexor stretch with reach at step x10 bilat Nustep level 1 x5 min with PT present to discuss status Supine SI joint correction x10 bilat Sidelying clamshell and reverse clamshell x10 bilat Lower trunk rotation x10 bilat Trigger Point Dry-Needling  Treatment instructions: Expect mild to moderate muscle soreness. S/S of pneumothorax if dry needled over a lung field, and to seek immediate medical attention should they occur. Patient verbalized understanding of these instructions and education. Patient Consent Given: Yes Education handout provided: Previously provided Muscles treated: Left lumbar multifidi, bilateral piriformis Electrical stimulation performed: No Parameters: N/A Treatment response/outcome: Utilized skilled palpation to identify trigger points.  Able to palpate twitch response and muscle elongation following. Manual Therapy:  Addaday to bilateral lumbar paraspinals, glutes/piriformis, hamstrings, and IT band Seated piriformis  stretch 2x20 sec bilat Seated pelvic tilt x10     PATIENT EDUCATION:  Education details: Careers adviser Person educated: Patient Education method: Education officer, environmental, Education comprehension: verbalized understanding  HOME EXERCISE PROGRAM: Access Code: [email or web address] URL: [email or web address] Date: 07/16/2022 Prepared by: Shelby Dubin Menke  Exercises - Clamshell  - 1 x daily - 7 x weekly - 2 sets - 10 reps - Sidelying Reverse Clamshell  - 1 x daily - 7 x weekly - 2 sets - 10 reps - Supine SI Joint Self-Correction  - 1 x daily - 7 x weekly - 2 sets - 5 reps - 5 sec hold - Psoas Mobilization with Small Ball  - 1 x daily - 7 x weekly - 2 sets - 10 reps - Seated Pelvic Tilt  - 1 x daily - 7 x weekly - 2 sets - 10 reps - Seated Piriformis Stretch  - 1 x daily - 7 x weekly - 1 sets - 2 reps - 20 sec hold - Half Kneeling Hip Flexor Stretch  - 1 x daily - 7 x weekly - 1 sets - 2 reps - 20 sec hold - Standing Hamstring Stretch on Chair  - 1 x daily - 7 x weekly - 1 sets - 2 reps - 20 sec hold  ASSESSMENT:  CLINICAL IMPRESSION: Pt reports very minimal SI pain even after move yesterday (house to house)  LB just stiff.  Focus today on stretching and strength LB/core. Of note: she is slightly more limited in right vs Left hip ER today (prior it was opposite).  She reports feeling a little weak in that hip as well although continues to have lasting relief from DN 1 week ago. She is progressing very well towards goals.  I did add a few more aquatic visits to ensure appointments if needed.    Patient presents to skilled PT reporting  that she is feeling some relief from pain with the stretches, but continues to have pain.  Patient states that she would like to try dry needling today.  Patient with good response to dry needling, reporting pain following dry needling and manual therapy decreased to 1-2/10 when ambulating around PT gym.  Patient with good response to seated piriformis  stretch and added to HEP.  Patient to have aquatic PT session tomorrow.  OBJECTIVE IMPAIRMENTS: decreased strength, increased muscle spasms, impaired flexibility, and pain.   ACTIVITY LIMITATIONS: bending and sitting  PARTICIPATION LIMITATIONS: community activity and occupation  PERSONAL FACTORS: Time since onset of injury/illness/exacerbation are also affecting patient's functional outcome.   REHAB POTENTIAL: Good  CLINICAL DECISION MAKING: Stable/uncomplicated  EVALUATION COMPLEXITY: Low   GOALS: Goals reviewed with patient? Yes  SHORT TERM GOALS: Target date: 07/12/2022 Pt will be independent with initial HEP. Baseline: Goal status: MET  2.  Patient will report at least a 30% improvements in functional mobility. Baseline:  Goal status: IN PROGRESS   LONG TERM GOALS: Target date: 08/17/2022  Patient will be independent with advanced HEP. Baseline:  Goal status: INITIAL  2.  Patient will be able to work a shift of work without increased SI pain. Baseline:  Goal status: INITIAL  3.  Patient will be able to exercise in the gym without increased SI pain. Baseline:  Goal status: INITIAL  4.  Patient will increase FOTO to at least 68% to demonstrate improvements in functional mobility. Baseline: 46% Goal status: INITIAL  5.  Patient will increase left hip strength to at least 4+/5 to allow her to perform functional tasks with increased ease. Baseline:  Goal status: INITIAL   PLAN:  PT FREQUENCY: 1-2x/week  PT DURATION: 8 weeks  PLANNED INTERVENTIONS: Therapeutic exercises, Therapeutic activity, Neuromuscular re-education, Balance training, Gait training, Patient/Family education, Self Care, Joint mobilization, Joint manipulation, Stair training, Aquatic Therapy, Dry Needling, Electrical stimulation, Spinal manipulation, Spinal mobilization, Cryotherapy, Moist heat, Taping, Traction, Ultrasound, Ionotophoresis '4mg'$ /ml Dexamethasone, Manual therapy, and  Re-evaluation  PLAN FOR NEXT SESSION: assess and progress HEP as indicated, aquatic PT, dry needling/manual therapy as indicated, core stability   Stanton Kidney ([place]) Yakelin Grenier MPT 07/25/22 9:03 AM

## 2022-07-25 NOTE — Telephone Encounter (Signed)
Dr Leighton Ruff [phone]  Dr Marcello Moores sent message that she has ear pain and would like to come in on Friday to be seen, I have scheduled her for 2:00 on Friday.

## 2022-07-25 NOTE — Telephone Encounter (Signed)
Patient having ear pain. Given appt for Friday. Calling in Zithromax to [facility] on [address] today.

## 2022-07-25 NOTE — Progress Notes (Signed)
Patient Care Team: Elby Showers, MD as PCP - General (Internal Medicine)  Visit Date: 07/25/22  Subjective:    Patient ID: Penny Blitz, MD , Female   DOB: May 08, 1981, 42 y.o.    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 Patient has a past medical history of GERD, hyperlipidemia,  hypothyroidism, joint pain, pulmonary emboli and remains on chronic anticoagulation.  History of Vitamin D deficiency.  History of papillary thyroid cancer and underwent right thyroid lobectomy by Dr. Harlow Asa in February 2020 and is followed by Dr. Letta Median, Endocrinologist  She called earlier in the week and I elected to start  her on Zithromax Z-PAK.  She is here for follow-up today.  Has had no recent travel.  Has now developed low-grade temp.  Has malaise and fatigue.  She has had some dust exposure with recent moved to a new condominium.  She is wondering if some of the symptoms could be allergy related.  However she has developed a low-grade need fever and I doubt that is related to allergy symptoms.  She has developed upper respiratory congestion and has been taking some over-the-counter decongestants and antihistamines.   Past Medical History:  Diagnosis Date   Back pain    GERD (gastroesophageal reflux disease)    resolved    H/O blood clots    High cholesterol    Hypertension    Hypothyroidism    Joint pain    PONV (postoperative nausea and vomiting)    mild nausea responsive to meds    Pulmonary embolism ([place]) 10/2017   Thyroid carcinoma ([place])    Vitamin D deficiency      Family History  Problem Relation Age of Onset   Thyroid disease Mother    Heart disease Paternal Grandmother    Heart disease Paternal Grandfather    Breast cancer Maternal Aunt 50         ROS no shaking chills. Has had low grade fever, malaise and fatigue      Objective:   Vitals: 100.6 degrees, pulse 93 regular, blood pressure 102/74 pulse  oximetry 99%   Physical Exam skin: Warm and dry.  Pharynx slightly injected without exudate.  TMs are clear bilaterally.  Neck is supple.  Chest clear.   Results:   No testing done today as she is already on Zithromax.      Component Value Date/Time   NA 139 01/09/2022 1236   K 4.8 01/09/2022 1236   CL 103 01/09/2022 1236   CO2 20 01/09/2022 1236   GLUCOSE 87 01/09/2022 1236   GLUCOSE 108 (H) 07/10/2021 0912   BUN 18 01/09/2022 1236   CREATININE 0.80 01/09/2022 1236   CREATININE 0.85 07/10/2021 0912   CALCIUM 9.1 01/09/2022 1236   PROT 6.4 01/09/2022 1236   ALBUMIN 4.2 01/09/2022 1236   AST 15 01/09/2022 1236   ALT 10 01/09/2022 1236   ALKPHOS 63 01/09/2022 1236   BILITOT 0.5 01/09/2022 1236   GFRNONAA 86 04/30/2019 0904   GFRAA 99 04/30/2019 0904         Assessment & Plan:   Acute upper respiratory infection  Plan: She will continue with Zithromax Z-PAK as previously prescribed.  1 g IM Rocephin.  Depo-Medrol 80 mg IM given for congestion.  She declines medication for cough.  She will call if not improving within 48 to 72 hours or so.    I,Alexander Ruley,acting as a Education administrator for [employer]  Gerrianne Scale, MD.,have documented all relevant documentation on the behalf of Elby Showers, MD,as directed by  Elby Showers, MD while in the presence of Elby Showers, MD.

## 2022-07-26 ENCOUNTER — Ambulatory Visit: Payer: BC Managed Care – PPO | Attending: Family Medicine

## 2022-07-26 DIAGNOSIS — M533 Sacrococcygeal disorders, not elsewhere classified: Secondary | ICD-10-CM | POA: Insufficient documentation

## 2022-07-26 DIAGNOSIS — M6281 Muscle weakness (generalized): Secondary | ICD-10-CM | POA: Insufficient documentation

## 2022-07-26 DIAGNOSIS — R252 Cramp and spasm: Secondary | ICD-10-CM | POA: Insufficient documentation

## 2022-07-26 NOTE — Therapy (Signed)
OUTPATIENT PHYSICAL THERAPY TREATMENT NOTE   Patient Name: Penny Blatter, MD MRN: [id number] DOB:[birth year], 42 y.o., female Today's Date: 07/26/2022  END OF SESSION:  PT End of Session - 07/26/22 1012     Visit Number 5    Date for PT Re-Evaluation 08/17/22    Authorization Type BC/BS    PT Start Time 0933    PT Stop Time 1013    PT Time Calculation (min) 40 min    Activity Tolerance Patient tolerated treatment well    Behavior During Therapy WFL for tasks assessed/performed              Past Medical History:  Diagnosis Date   Back pain    GERD (gastroesophageal reflux disease)    resolved    H/O blood clots    High cholesterol    Hypertension    Hypothyroidism    Joint pain    PONV (postoperative nausea and vomiting)    mild nausea responsive to meds    Pulmonary embolism ([place]) 10/2017   Thyroid carcinoma ([place])    Vitamin D deficiency    Past Surgical History:  Procedure Laterality Date   broken wrist     KNEE SURGERY     arthroscopy    THYROIDECTOMY Right 07/24/2018   Procedure: RIGHT THYROID LOBECTOMY  AND ISTHMUS;  Surgeon: Armandina Gemma, MD;  Location: [facility];  Service: General;  Laterality: Right;   Patient Active Problem List   Diagnosis Date Noted   Insulin resistance 07/17/2022   BMI 40.0-44.9, adult ([place]) 07/17/2022   Obesity, Beginning BMI 46.99 07/17/2022   Sacroiliitis ([place]) 06/19/2022   Stress 04/24/2022   Other specified hypothyroidism 04/02/2022   Depression 03/20/2022   Class 3 severe obesity with serious comorbidity and body mass index (BMI) of 45.0 to 49.9 in adult ([place]) 03/20/2022   Vitamin D deficiency 03/06/2022   History of pulmonary embolism 11/28/2021   Hypercholesterolemia 04/27/2021   Hypertensive disorder 04/27/2021   Shingles of eyelid 02/22/2020   Postsurgical hypothyroidism 10/28/2018   Papillary thyroid carcinoma ([place]) 10/28/2018    PCP: Elby Showers, MD  REFERRING PROVIDER: Starlyn Skeans,  MD  REFERRING DIAG: M46.1 (ICD-10-CM) - Sacroiliitis ([place])  THERAPY DIAG:  Muscle weakness (generalized)  Cramp and spasm  SI (sacroiliac) pain  Rationale for Evaluation and Treatment: Rehabilitation  ONSET DATE: 6-8 months ago  SUBJECTIVE:   SUBJECT:  She worked me hard in the pool yesterday.  I am off work this week and I moved.  A lot of lifting.  80% overall improvement in symptoms since the start of care.    PERTINENT HISTORY: bilat pulmonary embolism on 11/18/2017, thyroid cancer s/p right thyroidectomy 07/24/2018, elevated BMI, HTN  PAIN:  Are you having pain? Yes: NPRS scale: 0/10 Pain location: right SI Pain description: sore, occasional sharp pains with certain movements Aggravating factors: bending, sitting Relieving factors: standing, lying flat  PRECAUTIONS: None  WEIGHT BEARING RESTRICTIONS: No  FALLS:  Has patient fallen in last 6 months? No  LIVING ENVIRONMENT: Lives with: lives alone Lives in: House/apartment Stairs: Yes: Internal: 15 steps; can reach both and External: 3-4 steps; can reach both Has following equipment at home: Single point cane, Crutches, and None  OCCUPATION: Surgeon, does robotic surgery  PLOF: Independent, Vocation/Vocational requirements: sitting with robotic surgery, and Leisure: gardening  PATIENT GOALS: To no longer have pain.  NEXT MD VISIT: as needed  OBJECTIVE:   DIAGNOSTIC FINDINGS: n/a  PATIENT SURVEYS:  FOTO 46% (projected  68% by visit 13)  COGNITION: Overall cognitive status: Within functional limits for tasks assessed     SENSATION: Pt reports when she is on treadmill or elliptical, has numbness on 3rd and 4th toes (primarily on left)  MUSCLE LENGTH: Hamstrings: Right 80 deg; Left 75 deg Wessler test: increased tightness on right side  PALPATION: Tenderness to palpation along SI joint  LOWER EXTREMITY ROM: WFL  LOWER EXTREMITY MMT:  Left hip strength 4/5, Right is WFL   LOWER EXTREMITY SPECIAL  TESTS:  Hip special tests: SI distraction test: positive   FUNCTIONAL TESTS:  06/21/2021: 30 seconds chair stand test 14 stands with discomfort in SI joint in left  GAIT: Distance walked: >500 ft Assistive device utilized: None Level of assistance: Complete Independence Comments: denies pain with in clinic ambulation   TODAY'S TREATMENT:    DATE:  07/26/2022  Standing hamstring stretch at step 2x20 sec bilat Standing hip flexor stretch with reach at step x10 bilat Nustep level 3 x5 min with PT present to discuss status Sidelying clamshell and reverse clamshell x10 bilat Lower trunk rotation x10 bilat Trigger Point Dry-Needling  Treatment instructions: Expect mild to moderate muscle soreness. S/S of pneumothorax if dry needled over a lung field, and to seek immediate medical attention should they occur. Patient verbalized understanding of these instructions and education. Patient Consent Given: Yes Education handout provided: Previously provided Muscles treated: Bil lumbar multifidi, bilateral piriformis Electrical stimulation performed: No Parameters: N/A Treatment response/outcome: Utilized skilled palpation to identify trigger points.  Able to palpate twitch response and muscle elongation following. Manual Therapy: elongation and release to bil back and gluteals.    DATE:  07/17/2022                                                                                                                            Pt seen for aquatic therapy today.  Treatment took place in water 3.25-4.5 ft in depth at the [facility]. Temp of water was 91.  Pt entered/exited the pool via stairs independently with single rail.  * walking forward/backward and side stepping without support *L stretch then L stretch with tail wagging *hip flex and quad stretch standing using noodle *Hamstring, gastroc, adductor and IT band stretching standing using noodle  *Plank with hip extension glute  contraction end range *KB press 5 x 10s *KB row feet staggered then wide stance x 12 reps ea position KB resisted core rotation R/L x 8 Core strengthening using hand bells with oscillations in sagittal and frontal planes x5-10 quick then slow 2-3 sets in ea position * walking forward/backward with single yellow hand float at side with core engaged * holding wall 1/2 diamond and reverse 1/2 diamond (slight limited in R external hip rotation today)   Pt requires the buoyancy and hydrostatic pressure of water for support, and to offload joints by unweighting joint load by at least 50 % in navel deep water and by at least  75-80% in chest to neck deep water.  Viscosity of the water is needed for resistance of strengthening. Water current perturbations provides challenge to standing balance requiring increased core activation.  DATE:  07/17/2022                                                                                                                            Pt seen for aquatic therapy today.  Treatment took place in water 3.25-4.5 ft in depth at the [facility]. Temp of water was 91.  Pt entered/exited the pool via stairs independently with single rail.  * walking forward/backward and side stepping without support *hip flex and quad stretch standing using noodle *Hamstring, gastroc, adductor and IT band stretching standing using noodle * walking forward/backward with single yellow hand float at side with core engaged *STS 3rd water step with arm reach forward cues for abd bracing and pelvic floor contraction *Seated piriformis stretch 3rd step *Plank with hip extension glute contraction end range * holding wall 1/2 diamond and reverse 1/2 diamond( improved left hip ext rot) *L stretch *Seated on yellow noodle: ant/posterior pelvic tilts; hip hiking R/L; rotation R and L *side lunge with ue add/abd using yellow hand buoys 4 widths.  Pt requires the buoyancy and hydrostatic  pressure of water for support, and to offload joints by unweighting joint load by at least 50 % in navel deep water and by at least 75-80% in chest to neck deep water.  Viscosity of the water is needed for resistance of strengthening. Water current perturbations provides challenge to standing balance requiring increased core activation.  PATIENT EDUCATION:  Education details: Aquatics intro Person educated: Patient Education method: Education officer, environmental, Education comprehension: verbalized understanding  HOME EXERCISE PROGRAM: Access Code: [email or web address] URL: [email or web address] Date: 07/16/2022 Prepared by: Shelby Dubin Menke  Exercises - Clamshell  - 1 x daily - 7 x weekly - 2 sets - 10 reps - Sidelying Reverse Clamshell  - 1 x daily - 7 x weekly - 2 sets - 10 reps - Supine SI Joint Self-Correction  - 1 x daily - 7 x weekly - 2 sets - 5 reps - 5 sec hold - Psoas Mobilization with Small Ball  - 1 x daily - 7 x weekly - 2 sets - 10 reps - Seated Pelvic Tilt  - 1 x daily - 7 x weekly - 2 sets - 10 reps - Seated Piriformis Stretch  - 1 x daily - 7 x weekly - 1 sets - 2 reps - 20 sec hold - Half Kneeling Hip Flexor Stretch  - 1 x daily - 7 x weekly - 1 sets - 2 reps - 20 sec hold - Standing Hamstring Stretch on Chair  - 1 x daily - 7 x weekly - 1 sets - 2 reps - 20 sec hold  ASSESSMENT:  CLINICAL IMPRESSION: Pt reports some soreness after aquatic treatment yesterday.  Overall, 80% better since the start of care.  Pt responded well to DN last session and this was repeated today.  Pt with good response to DN today with twitch and improved tissue mobility after manual therapy. Pt will continue with HEP, aquatics and manual therapy to address   OBJECTIVE IMPAIRMENTS: decreased strength, increased muscle spasms, impaired flexibility, and pain.   ACTIVITY LIMITATIONS: bending and sitting  PARTICIPATION LIMITATIONS: community activity and occupation  PERSONAL FACTORS: Time since  onset of injury/illness/exacerbation are also affecting patient's functional outcome.   REHAB POTENTIAL: Good  CLINICAL DECISION MAKING: Stable/uncomplicated  EVALUATION COMPLEXITY: Low   GOALS: Goals reviewed with patient? Yes  SHORT TERM GOALS: Target date: 07/12/2022 Pt will be independent with initial HEP. Baseline: Goal status: MET  2.  Patient will report at least a 30% improvements in functional mobility. Baseline: 80% (07/26/22) Goal status: MET   LONG TERM GOALS: Target date: 08/17/2022  Patient will be independent with advanced HEP. Baseline:  Goal status: INITIAL  2.  Patient will be able to work a shift of work without increased SI pain. Baseline:  Goal status: INITIAL  3.  Patient will be able to exercise in the gym without increased SI pain. Baseline:  Goal status: INITIAL  4.  Patient will increase FOTO to at least 68% to demonstrate improvements in functional mobility. Baseline: 46% Goal status: INITIAL  5.  Patient will increase left hip strength to at least 4+/5 to allow her to perform functional tasks with increased ease. Baseline:  Goal status: INITIAL   PLAN:  PT FREQUENCY: 1-2x/week  PT DURATION: 8 weeks  PLANNED INTERVENTIONS: Therapeutic exercises, Therapeutic activity, Neuromuscular re-education, Balance training, Gait training, Patient/Family education, Self Care, Joint mobilization, Joint manipulation, Stair training, Aquatic Therapy, Dry Needling, Electrical stimulation, Spinal manipulation, Spinal mobilization, Cryotherapy, Moist heat, Taping, Traction, Ultrasound, Ionotophoresis '4mg'$ /ml Dexamethasone, Manual therapy, and Re-evaluation  PLAN FOR NEXT SESSION: assess and progress HEP as indicated, aquatic PT, dry needling/manual therapy as indicated, core stability  Sigurd Sos, PT 07/26/22 10:13 AM

## 2022-07-27 ENCOUNTER — Ambulatory Visit: Payer: BC Managed Care – PPO | Admitting: Internal Medicine

## 2022-07-27 ENCOUNTER — Encounter: Payer: Self-pay | Admitting: Internal Medicine

## 2022-07-27 ENCOUNTER — Other Ambulatory Visit (INDEPENDENT_AMBULATORY_CARE_PROVIDER_SITE_OTHER): Payer: Self-pay | Admitting: Family Medicine

## 2022-07-27 VITALS — BP 102/74 | HR 93 | Temp 100.6°F

## 2022-07-27 DIAGNOSIS — J069 Acute upper respiratory infection, unspecified: Secondary | ICD-10-CM | POA: Diagnosis not present

## 2022-07-27 DIAGNOSIS — F3289 Other specified depressive episodes: Secondary | ICD-10-CM

## 2022-07-27 MED ORDER — METHYLPREDNISOLONE ACETATE 80 MG/ML IJ SUSP
80.0000 mg | Freq: Once | INTRAMUSCULAR | Status: AC
  Administered 2022-07-27: 80 mg via INTRAMUSCULAR

## 2022-07-27 MED ORDER — CEFTRIAXONE SODIUM 1 G IJ SOLR
1.0000 g | Freq: Once | INTRAMUSCULAR | Status: AC
  Administered 2022-07-27: 1 g via INTRAMUSCULAR

## 2022-07-27 NOTE — Patient Instructions (Signed)
Continue Zithromax Z-PAK as prescribed.  Depo-Medrol 80 mg IM given for respiratory congestion along with Rocephin 1 g IM.  Call if not improving in 48 hours or sooner if worse.  Rest and stay well-hydrated.

## 2022-07-31 ENCOUNTER — Ambulatory Visit (HOSPITAL_BASED_OUTPATIENT_CLINIC_OR_DEPARTMENT_OTHER): Payer: BC Managed Care – PPO | Admitting: Physical Therapy

## 2022-07-31 ENCOUNTER — Encounter (HOSPITAL_BASED_OUTPATIENT_CLINIC_OR_DEPARTMENT_OTHER): Payer: Self-pay | Admitting: Physical Therapy

## 2022-07-31 DIAGNOSIS — M533 Sacrococcygeal disorders, not elsewhere classified: Secondary | ICD-10-CM

## 2022-07-31 DIAGNOSIS — M6281 Muscle weakness (generalized): Secondary | ICD-10-CM

## 2022-07-31 DIAGNOSIS — R252 Cramp and spasm: Secondary | ICD-10-CM | POA: Diagnosis not present

## 2022-07-31 NOTE — Therapy (Signed)
OUTPATIENT PHYSICAL THERAPY TREATMENT NOTE   Patient Name: Penny Kaszuba, MD MRN: [id number] DOB:Jun 08, 1981, 42 y.o., female Today's Date: 07/31/2022  END OF SESSION:  PT End of Session - 07/31/22 1534     Visit Number 6    Date for PT Re-Evaluation 08/17/22    Authorization Type BC/BS    PT Start Time 1530    PT Stop Time 1615    PT Time Calculation (min) 45 min    Activity Tolerance Patient tolerated treatment well    Behavior During Therapy WFL for tasks assessed/performed              Past Medical History:  Diagnosis Date   Back pain    GERD (gastroesophageal reflux disease)    resolved    H/O blood clots    High cholesterol    Hypertension    Hypothyroidism    Joint pain    PONV (postoperative nausea and vomiting)    mild nausea responsive to meds    Pulmonary embolism ([place]) 10/2017   Thyroid carcinoma ([place])    Vitamin D deficiency    Past Surgical History:  Procedure Laterality Date   broken wrist     KNEE SURGERY     arthroscopy    THYROIDECTOMY Right 07/24/2018   Procedure: RIGHT THYROID LOBECTOMY  AND ISTHMUS;  Surgeon: Armandina Gemma, MD;  Location: [facility];  Service: General;  Laterality: Right;   Patient Active Problem List   Diagnosis Date Noted   Insulin resistance 07/17/2022   BMI 40.0-44.9, adult ([place]) 07/17/2022   Obesity, Beginning BMI 46.99 07/17/2022   Sacroiliitis ([place]) 06/19/2022   Stress 04/24/2022   Other specified hypothyroidism 04/02/2022   Depression 03/20/2022   Class 3 severe obesity with serious comorbidity and body mass index (BMI) of 45.0 to 49.9 in adult ([place]) 03/20/2022   Vitamin D deficiency 03/06/2022   History of pulmonary embolism 11/28/2021   Hypercholesterolemia 04/27/2021   Hypertensive disorder 04/27/2021   Shingles of eyelid 02/22/2020   Postsurgical hypothyroidism 10/28/2018   Papillary thyroid carcinoma ([place]) 10/28/2018    PCP: Elby Showers, MD  REFERRING PROVIDER: Starlyn Skeans,  MD  REFERRING DIAG: M46.1 (ICD-10-CM) - Sacroiliitis ([place])  THERAPY DIAG:  Muscle weakness (generalized)  Cramp and spasm  SI (sacroiliac) pain  Rationale for Evaluation and Treatment: Rehabilitation  ONSET DATE: 6-8 months ago  SUBJECTIVE:   SUBJECT:  Pt reports that the DN helped last session. "I think the resisted rotation with kick board irritated my back last time". She did some yardwork this morning ([id number] up and moving pots) and this irritated her back.   PERTINENT HISTORY: bilat pulmonary embolism on 11/18/2017, thyroid cancer s/p right thyroidectomy 07/24/2018, elevated BMI, HTN  PAIN:  Are you having pain? Yes: NPRS scale: 3/10 Pain location: left SI Pain description: sore, occasional sharp pains with certain movements Aggravating factors: bending, sitting Relieving factors: standing, lying flat  PRECAUTIONS: None  WEIGHT BEARING RESTRICTIONS: No  FALLS:  Has patient fallen in last 6 months? No  LIVING ENVIRONMENT: Lives with: lives alone Lives in: House/apartment Stairs: Yes: Internal: 15 steps; can reach both and External: 3-4 steps; can reach both Has following equipment at home: Single point cane, Crutches, and None  OCCUPATION: Surgeon, does robotic surgery  PLOF: Independent, Vocation/Vocational requirements: sitting with robotic surgery, and Leisure: gardening  PATIENT GOALS: To no longer have pain.  NEXT MD VISIT: as needed  OBJECTIVE:   DIAGNOSTIC FINDINGS: n/a  PATIENT SURVEYS:  FOTO  46% (projected 68% by visit 13)  COGNITION: Overall cognitive status: Within functional limits for tasks assessed     SENSATION: Pt reports when she is on treadmill or elliptical, has numbness on 3rd and 4th toes (primarily on left)  MUSCLE LENGTH: Hamstrings: Right 80 deg; Left 75 deg Izquierdo test: increased tightness on right side  PALPATION: Tenderness to palpation along SI joint  LOWER EXTREMITY ROM: WFL  LOWER EXTREMITY MMT:  Left hip  strength 4/5, Right is WFL   LOWER EXTREMITY SPECIAL TESTS:  Hip special tests: SI distraction test: positive   FUNCTIONAL TESTS:  06/21/2021: 30 seconds chair stand test 14 stands with discomfort in SI joint in left  GAIT: Distance walked: >500 ft Assistive device utilized: None Level of assistance: Complete Independence Comments: denies pain with in clinic ambulation   TODAY'S TREATMENT:    DATE:  07/31/2022  Pt seen for aquatic therapy today.  Treatment took place in water 3.5-4.75 ft in depth at the [facility] pool. Temp of water was 91.  Pt entered/exited the pool via stairs independently with single rail.   * walking forward/backward and side stepping without support * Holding wall: single leg clams x 10 x 2 each LE * TrA set with noodle pull down to thighs x 12 * staggered stance with kick board push/pull x 10 each LE forward x 2 sets * wide stance with gentle trunk rotation  * Warrior 1 lifting noodle towards ceiling x 5 each leg forward  * plank with hands on bench with hip ext -> opp arm/leg lift * plank with hands on yellow noodle and arms float up to superman x 5 * return to side stepping with shoulder addct with rainbow hand floats * unilateral shoulder flex/ext (0-90) with TrA set x 5 each *hip flex and quad stretch standing using solid green noodle x 15s each LE *L stretch then L stretch with tail wagging  *Hamstring, gastroc, adductor and IT band stretching standing using noodle   DATE:  07/26/2022  Standing hamstring stretch at step 2x20 sec bilat Standing hip flexor stretch with reach at step x10 bilat Nustep level 3 x5 min with PT present to discuss status Sidelying clamshell and reverse clamshell x10 bilat Lower trunk rotation x10 bilat Trigger Point Dry-Needling  Treatment instructions: Expect mild to moderate muscle soreness. S/S of pneumothorax if dry needled over a lung field, and to seek immediate medical attention should they occur. Patient  verbalized understanding of these instructions and education. Patient Consent Given: Yes Education handout provided: Previously provided Muscles treated: Bil lumbar multifidi, bilateral piriformis Electrical stimulation performed: No Parameters: N/A Treatment response/outcome: Utilized skilled palpation to identify trigger points.  Able to palpate twitch response and muscle elongation following. Manual Therapy: elongation and release to bil back and gluteals.    DATE:  07/25/2022  Pt seen for aquatic therapy today.  Treatment took place in water 3.25-4.5 ft in depth at the [facility]. Temp of water was 91.  Pt entered/exited the pool via stairs independently with single rail.  * walking forward/backward and side stepping without support *L stretch then L stretch with tail wagging *hip flex and quad stretch standing using noodle *Hamstring, gastroc, adductor and IT band stretching standing using noodle  *Plank with hip extension glute contraction end range *KB press 5 x 10s *KB row feet staggered then wide stance x 12 reps ea position KB resisted core rotation R/L x 8 Core strengthening using hand bells with oscillations in sagittal and frontal planes x5-10 quick then slow 2-3 sets in ea position * walking forward/backward with single yellow hand float at side with core engaged * holding wall 1/2 diamond and reverse 1/2 diamond (slight limited in R external hip rotation today)  PATIENT EDUCATION:  Education details: Aquatics progressions and modifications  Person educated: Patient Education method: Education officer, environmental, Education comprehension: verbalized understanding  HOME EXERCISE PROGRAM: Access Code: [email or web address] URL: [email or web address] Date: 07/16/2022 Prepared by: Shelby Dubin Menke  Exercises - Clamshell  - 1 x daily - 7 x  weekly - 2 sets - 10 reps - Sidelying Reverse Clamshell  - 1 x daily - 7 x weekly - 2 sets - 10 reps - Supine SI Joint Self-Correction  - 1 x daily - 7 x weekly - 2 sets - 5 reps - 5 sec hold - Psoas Mobilization with Small Ball  - 1 x daily - 7 x weekly - 2 sets - 10 reps - Seated Pelvic Tilt  - 1 x daily - 7 x weekly - 2 sets - 10 reps - Seated Piriformis Stretch  - 1 x daily - 7 x weekly - 1 sets - 2 reps - 20 sec hold - Half Kneeling Hip Flexor Stretch  - 1 x daily - 7 x weekly - 1 sets - 2 reps - 20 sec hold - Standing Hamstring Stretch on Chair  - 1 x daily - 7 x weekly - 1 sets - 2 reps - 20 sec hold  ASSESSMENT:  CLINICAL IMPRESSION:  Pt reports good responds to DN last session. Dialed back intensity of session and modified exercises to tolerance in water.  Pt reported increased tightness along Lt quad during stretch; improved afterwards.  Overall pain decreased during session, however the Lt sciatica pain remained.  Pt will continue with HEP, aquatics and manual therapy to address.  Progressing gradually towards goals.  PT to complete FOTO next visit for LTG.   OBJECTIVE IMPAIRMENTS: decreased strength, increased muscle spasms, impaired flexibility, and pain.   ACTIVITY LIMITATIONS: bending and sitting  PARTICIPATION LIMITATIONS: community activity and occupation  PERSONAL FACTORS: Time since onset of injury/illness/exacerbation are also affecting patient's functional outcome.   REHAB POTENTIAL: Good  CLINICAL DECISION MAKING: Stable/uncomplicated  EVALUATION COMPLEXITY: Low   GOALS: Goals reviewed with patient? Yes  SHORT TERM GOALS: Target date: 07/12/2022 Pt will be independent with initial HEP. Baseline: Goal status: MET  2.  Patient will report at least a 30% improvements in functional mobility. Baseline: 80% (07/26/22) Goal status: MET   LONG TERM GOALS: Target date: 08/17/2022  Patient will be independent with advanced HEP. Baseline:  Goal status:  INITIAL  2.  Patient will be able to work a shift of work without increased SI pain. Baseline:  Goal status: INITIAL  3.  Patient will be able to exercise in  the gym without increased SI pain. Baseline:  Goal status: INITIAL  4.  Patient will increase FOTO to at least 68% to demonstrate improvements in functional mobility. Baseline: 46% Goal status: INITIAL  5.  Patient will increase left hip strength to at least 4+/5 to allow her to perform functional tasks with increased ease. Baseline:  Goal status: INITIAL   PLAN:  PT FREQUENCY: 1-2x/week  PT DURATION: 8 weeks  PLANNED INTERVENTIONS: Therapeutic exercises, Therapeutic activity, Neuromuscular re-education, Balance training, Gait training, Patient/Family education, Self Care, Joint mobilization, Joint manipulation, Stair training, Aquatic Therapy, Dry Needling, Electrical stimulation, Spinal manipulation, Spinal mobilization, Cryotherapy, Moist heat, Taping, Traction, Ultrasound, Ionotophoresis 62m/ml Dexamethasone, Manual therapy, and Re-evaluation  PLAN FOR NEXT SESSION: assess and progress HEP as indicated, aquatic PT, dry needling/manual therapy as indicated, core stability  JKerin Perna PTA 07/31/22 4:24 PM [facility] [address] [address]Phone: [phone]  Fax:  [phone]

## 2022-07-31 NOTE — Progress Notes (Signed)
Chief Complaint:   OBESITY Penny Hancock is here to discuss her progress with her obesity treatment plan along with follow-up of her obesity related diagnoses. Penny Hancock is on keeping a food journal and adhering to recommended goals of 1500 calories and 85+ grams of protein and states she is following her eating plan approximately 85% of the time. Penny Hancock states she is lifting weights for 20 minutes 6 times per week.  Today's visit was #: 11 Starting weight: 300 lbs Starting date: 01/09/2022 Today's weight: 272 lbs Today's date: 07/17/2022 Total lbs lost to date: 28 Total lbs lost since last in-office visit: 9  Interim History: Penny Hancock has done well with weight loss in the last month. She notes increased polyphagia in the afternoons, often between 3-5 pm. She is open to discussing more easy meal options especially for breakfast.   Subjective:   1. Insulin resistance Penny Hancock is working on her diet and weight loss. She notes increased afternoon polyphagia.   2. Emotional Eating Behavior Penny Hancock is stable on her medications with no side effects or increase in blood pressure.   Assessment/Plan:   1. Insulin resistance Penny Hancock agreed to start metformin 500 mg daily with no refills. She will work on increasing morning protein.   - metFORMIN (GLUCOPHAGE) 500 MG tablet; Take 1 tablet (500 mg total) by mouth daily with breakfast.  Dispense: 30 tablet; Refill: 0  2. Emotional Eating Behavior We will refill Wellbutrin SR for 90 days to [facility].   - buPROPion (WELLBUTRIN SR) 200 MG 12 hr tablet; Take 1 tablet (200 mg total) by mouth every morning.  Dispense: 90 tablet; Refill: 0  3. BMI 40.0-44.9, adult (Penny Hancock)  4. Obesity, Beginning BMI [id number] Diary is currently in the action stage of change. As such, her goal is to continue with weight loss efforts. She has agreed to keeping a food journal and adhering to recommended goals of 1500 calories and 85+ grams of protein daily.   Penny Hancock is to  work on increasing AM and lunch protein, various ideas were discussed. Will hold off on startring a GLP-1 for now, and reassess at her next visit.   Exercise goals: As is.   Behavioral modification strategies: increasing lean protein intake and meal planning and cooking strategies.  Penny Hancock has agreed to follow-up with our clinic in 4 weeks. She was informed of the importance of frequent follow-up visits to maximize her success with intensive lifestyle modifications for her multiple health conditions.   Objective:   Blood pressure 117/82, pulse 63, temperature 97.9 F (36.6 C), height 5' 7"$  (1.702 m), weight 272 lb (123.4 kg), SpO2 99 %. Body mass index is 42.6 kg/m.  General: Cooperative, alert, well developed, in no acute distress. HEENT: Conjunctivae and lids unremarkable. Cardiovascular: Regular rhythm.  Lungs: Normal work of breathing. Neurologic: No focal deficits.   Lab Results  Component Value Date   CREATININE 0.80 01/09/2022   BUN 18 01/09/2022   NA 139 01/09/2022   K 4.8 01/09/2022   CL 103 01/09/2022   CO2 20 01/09/2022   Lab Results  Component Value Date   ALT 10 01/09/2022   AST 15 01/09/2022   ALKPHOS 63 01/09/2022   BILITOT 0.5 01/09/2022   Lab Results  Component Value Date   HGBA1C 5.1 07/10/2021   HGBA1C 5.0 08/17/2019   HGBA1C 5.1 04/30/2019   Lab Results  Component Value Date   INSULIN 8.0 01/09/2022   Lab Results  Component Value Date   TSH 1.27  03/20/2022   Lab Results  Component Value Date   CHOL 140 12/26/2021   HDL 54 12/26/2021   LDLCALC 72 12/26/2021   TRIG 63 12/26/2021   CHOLHDL 2.6 12/26/2021   Lab Results  Component Value Date   VD25OH 53.3 01/09/2022   VD25OH 59 07/10/2021   VD25OH 23 (L) 04/30/2019   Lab Results  Component Value Date   WBC 5.8 07/10/2021   HGB 14.9 07/10/2021   HCT 44.0 07/10/2021   MCV 95.9 07/10/2021   PLT 246 07/10/2021   No results found for: "IRON", "TIBC", "FERRITIN"  Attestation  Statements:   Reviewed by clinician on day of visit: allergies, medications, problem list, medical history, surgical history, family history, social history, and previous encounter notes.   I, Trixie Dredge, am acting as transcriptionist for Dennard Nip, MD.  I have reviewed the above documentation for accuracy and completeness, and I agree with the above. -  Dennard Nip, MD

## 2022-08-07 ENCOUNTER — Encounter (HOSPITAL_BASED_OUTPATIENT_CLINIC_OR_DEPARTMENT_OTHER): Payer: Self-pay | Admitting: Physical Therapy

## 2022-08-07 ENCOUNTER — Ambulatory Visit (HOSPITAL_BASED_OUTPATIENT_CLINIC_OR_DEPARTMENT_OTHER): Payer: BC Managed Care – PPO | Admitting: Physical Therapy

## 2022-08-07 DIAGNOSIS — M6281 Muscle weakness (generalized): Secondary | ICD-10-CM | POA: Diagnosis not present

## 2022-08-07 DIAGNOSIS — M533 Sacrococcygeal disorders, not elsewhere classified: Secondary | ICD-10-CM

## 2022-08-07 DIAGNOSIS — R252 Cramp and spasm: Secondary | ICD-10-CM | POA: Diagnosis not present

## 2022-08-07 NOTE — Therapy (Signed)
OUTPATIENT PHYSICAL THERAPY TREATMENT NOTE   Patient Name: Penny Rupinski, MD MRN: [id number] DOB:Feb 24, 1981, 42 y.o., female Today's Date: 08/07/2022  END OF SESSION:  PT End of Session - 08/07/22 1838     Visit Number 7    Date for PT Re-Evaluation 08/17/22    Authorization Type BC/BS    PT Start Time 1701    PT Stop Time 1745    PT Time Calculation (min) 44 min    Activity Tolerance Patient tolerated treatment well    Behavior During Therapy WFL for tasks assessed/performed              Past Medical History:  Diagnosis Date   Back pain    GERD (gastroesophageal reflux disease)    resolved    H/O blood clots    High cholesterol    Hypertension    Hypothyroidism    Joint pain    PONV (postoperative nausea and vomiting)    mild nausea responsive to meds    Pulmonary embolism ([place]) 10/2017   Thyroid carcinoma ([place])    Vitamin D deficiency    Past Surgical History:  Procedure Laterality Date   broken wrist     KNEE SURGERY     arthroscopy    THYROIDECTOMY Right 07/24/2018   Procedure: RIGHT THYROID LOBECTOMY  AND ISTHMUS;  Surgeon: Armandina Gemma, MD;  Location: [facility];  Service: General;  Laterality: Right;   Patient Active Problem List   Diagnosis Date Noted   Insulin resistance 07/17/2022   BMI 40.0-44.9, adult ([place]) 07/17/2022   Obesity, Beginning BMI 46.99 07/17/2022   Sacroiliitis ([place]) 06/19/2022   Stress 04/24/2022   Other specified hypothyroidism 04/02/2022   Depression 03/20/2022   Class 3 severe obesity with serious comorbidity and body mass index (BMI) of 45.0 to 49.9 in adult ([place]) 03/20/2022   Vitamin D deficiency 03/06/2022   History of pulmonary embolism 11/28/2021   Hypercholesterolemia 04/27/2021   Hypertensive disorder 04/27/2021   Shingles of eyelid 02/22/2020   Postsurgical hypothyroidism 10/28/2018   Papillary thyroid carcinoma ([place]) 10/28/2018    PCP: Elby Showers, MD  REFERRING PROVIDER: Starlyn Skeans,  MD  REFERRING DIAG: M46.1 (ICD-10-CM) - Sacroiliitis ([place])  THERAPY DIAG:  Muscle weakness (generalized)  Cramp and spasm  SI (sacroiliac) pain  Rationale for Evaluation and Treatment: Rehabilitation  ONSET DATE: 6-8 months ago  SUBJECTIVE:   SUBJECT:  "No SI pain for last 3 days just tightness"   PERTINENT HISTORY: bilat pulmonary embolism on 11/18/2017, thyroid cancer s/p right thyroidectomy 07/24/2018, elevated BMI, HTN  PAIN:  Are you having pain? Yes: NPRS scale: 3/10 Pain location: left SI Pain description: sore, occasional sharp pains with certain movements Aggravating factors: bending, sitting Relieving factors: standing, lying flat  PRECAUTIONS: None  WEIGHT BEARING RESTRICTIONS: No  FALLS:  Has patient fallen in last 6 months? No  LIVING ENVIRONMENT: Lives with: lives alone Lives in: House/apartment Stairs: Yes: Internal: 15 steps; can reach both and External: 3-4 steps; can reach both Has following equipment at home: Single point cane, Crutches, and None  OCCUPATION: Surgeon, does robotic surgery  PLOF: Independent, Vocation/Vocational requirements: sitting with robotic surgery, and Leisure: gardening  PATIENT GOALS: To no longer have pain.  NEXT MD VISIT: as needed  OBJECTIVE:   DIAGNOSTIC FINDINGS: n/a  PATIENT SURVEYS:  FOTO 46% (projected 68% by visit 13)  COGNITION: Overall cognitive status: Within functional limits for tasks assessed     SENSATION: Pt reports when she is on  treadmill or elliptical, has numbness on 3rd and 4th toes (primarily on left)  MUSCLE LENGTH: Hamstrings: Right 80 deg; Left 75 deg [place] test: increased tightness on right side  PALPATION: Tenderness to palpation along SI joint  LOWER EXTREMITY ROM: WFL  LOWER EXTREMITY MMT:  Left hip strength 4/5, Right is WFL   LOWER EXTREMITY SPECIAL TESTS:  Hip special tests: SI distraction test: positive   FUNCTIONAL TESTS:  06/21/2021: 30 seconds chair stand  test 14 stands with discomfort in SI joint in left  GAIT: Distance walked: >500 ft Assistive device utilized: None Level of assistance: Complete Independence Comments: denies pain with in clinic ambulation   TODAY'S TREATMENT:    DATE:  08/07/2022  Pt seen for aquatic therapy today.  Treatment took place in water 3.5-4.75 ft in depth at the [facility] pool. Temp of water was 91.  Pt entered/exited the pool via stairs independently with single rail.   * walking forward/backward and side stepping without support *L stretch then L stretch with tail wagging * Holding wall: single leg clams x 10 x 2 each LE * TrA set with noodle pull down to thighs using yellow noodle le staggered then wide stance x 10 * staggered stance and wide stance with kick board push/pull x 15 each LE forward x 2 sets * wide stance with gentle trunk rotation  * plank with arm lifts using yellow noodle x10 *Planks with leg lifts x 10 *seated on  noodle: ant/post pelvic tilts; hip hiking *Side lunges ue add/abd with yellow hand buoys x 4 widths     DATE:  07/26/2022  Standing hamstring stretch at step 2x20 sec bilat Standing hip flexor stretch with reach at step x10 bilat Nustep level 3 x5 min with PT present to discuss status Sidelying clamshell and reverse clamshell x10 bilat Lower trunk rotation x10 bilat Trigger Point Dry-Needling  Treatment instructions: Expect mild to moderate muscle soreness. S/S of pneumothorax if dry needled over a lung field, and to seek immediate medical attention should they occur. Patient verbalized understanding of these instructions and education. Patient Consent Given: Yes Education handout provided: Previously provided Muscles treated: Bil lumbar multifidi, bilateral piriformis Electrical stimulation performed: No Parameters: N/A Treatment response/outcome: Utilized skilled palpation to identify trigger points.  Able to palpate twitch response and muscle elongation  following. Manual Therapy: elongation and release to bil back and gluteals.    DATE:  07/25/2022                                                                                                                            Pt seen for aquatic therapy today.  Treatment took place in water 3.25-4.5 ft in depth at the [facility]. Temp of water was 91.  Pt entered/exited the pool via stairs independently with single rail.  * walking forward/backward and side stepping without support *L stretch then L stretch with tail wagging *hip flex and quad stretch standing using  noodle *Hamstring, gastroc, adductor and IT band stretching standing using noodle  *Plank with hip extension glute contraction end range *KB press 5 x 10s *KB row feet staggered then wide stance x 12 reps ea position KB resisted core rotation R/L x 8 Core strengthening using hand bells with oscillations in sagittal and frontal planes x5-10 quick then slow 2-3 sets in ea position * walking forward/backward with single yellow hand float at side with core engaged * holding wall 1/2 diamond and reverse 1/2 diamond (slight limited in R external hip rotation today)  PATIENT EDUCATION:  Education details: Aquatics progressions and modifications  Person educated: Patient Education method: Education officer, environmental, Education comprehension: verbalized understanding  HOME EXERCISE PROGRAM: Access Code: [email or web address] URL: [email or web address] Date: 07/16/2022 Prepared by: Shelby Dubin Menke  Exercises - Clamshell  - 1 x daily - 7 x weekly - 2 sets - 10 reps - Sidelying Reverse Clamshell  - 1 x daily - 7 x weekly - 2 sets - 10 reps - Supine SI Joint Self-Correction  - 1 x daily - 7 x weekly - 2 sets - 5 reps - 5 sec hold - Psoas Mobilization with Small Ball  - 1 x daily - 7 x weekly - 2 sets - 10 reps - Seated Pelvic Tilt  - 1 x daily - 7 x weekly - 2 sets - 10 reps - Seated Piriformis Stretch  - 1 x daily - 7 x  weekly - 1 sets - 2 reps - 20 sec hold - Half Kneeling Hip Flexor Stretch  - 1 x daily - 7 x weekly - 1 sets - 2 reps - 20 sec hold - Standing Hamstring Stretch on Chair  - 1 x daily - 7 x weekly - 1 sets - 2 reps - 20 sec hold  ASSESSMENT:  CLINICAL IMPRESSION:  Pt tolerates progression well with increasing resistance of foam buoys and/or increase in reps with exercises. She reports no SI pain for a few days just tightness in LB.  She is instructed on supine post pelvic tilting as she reports relief from position in sitting (on noodle). 1 aquatic session remains.  Good progression in setting.  She will be issued a laminated final aquatic HEP.   OBJECTIVE IMPAIRMENTS: decreased strength, increased muscle spasms, impaired flexibility, and pain.   ACTIVITY LIMITATIONS: bending and sitting  PARTICIPATION LIMITATIONS: community activity and occupation  PERSONAL FACTORS: Time since onset of injury/illness/exacerbation are also affecting patient's functional outcome.   REHAB POTENTIAL: Good  CLINICAL DECISION MAKING: Stable/uncomplicated  EVALUATION COMPLEXITY: Low   GOALS: Goals reviewed with patient? Yes  SHORT TERM GOALS: Target date: 07/12/2022 Pt will be independent with initial HEP. Baseline: Goal status: MET  2.  Patient will report at least a 30% improvements in functional mobility. Baseline: 80% (07/26/22) Goal status: MET   LONG TERM GOALS: Target date: 08/17/2022  Patient will be independent with advanced HEP. Baseline:  Goal status: INITIAL  2.  Patient will be able to work a shift of work without increased SI pain. Baseline:  Goal status: INITIAL  3.  Patient will be able to exercise in the gym without increased SI pain. Baseline:  Goal status: INITIAL  4.  Patient will increase FOTO to at least 68% to demonstrate improvements in functional mobility. Baseline: 46% Goal status: INITIAL  5.  Patient will increase left hip strength to at least 4+/5 to allow her  to perform functional tasks with increased ease. Baseline:  Goal status: INITIAL   PLAN:  PT FREQUENCY: 1-2x/week  PT DURATION: 8 weeks  PLANNED INTERVENTIONS: Therapeutic exercises, Therapeutic activity, Neuromuscular re-education, Balance training, Gait training, Patient/Family education, Self Care, Joint mobilization, Joint manipulation, Stair training, Aquatic Therapy, Dry Needling, Electrical stimulation, Spinal manipulation, Spinal mobilization, Cryotherapy, Moist heat, Taping, Traction, Ultrasound, Ionotophoresis 38m/ml Dexamethasone, Manual therapy, and Re-evaluation  PLAN FOR NEXT SESSION: assess and progress HEP as indicated, aquatic PT, dry needling/manual therapy as indicated, core stability  MAnnamarie Major Rashana Andrew MPT 08/07/22 6:38 PM [facility] [address] [address]Phone: [phone]  Fax:  [phone]

## 2022-08-08 ENCOUNTER — Encounter: Payer: Self-pay | Admitting: Physical Therapy

## 2022-08-08 ENCOUNTER — Ambulatory Visit: Payer: BC Managed Care – PPO | Admitting: Physical Therapy

## 2022-08-08 DIAGNOSIS — M533 Sacrococcygeal disorders, not elsewhere classified: Secondary | ICD-10-CM

## 2022-08-08 DIAGNOSIS — R252 Cramp and spasm: Secondary | ICD-10-CM | POA: Diagnosis not present

## 2022-08-08 DIAGNOSIS — M6281 Muscle weakness (generalized): Secondary | ICD-10-CM

## 2022-08-08 NOTE — Therapy (Signed)
OUTPATIENT PHYSICAL THERAPY TREATMENT NOTE   Patient Name: Penny Fang, MD MRN: [id number] DOB:[birth year], 42 y.o., female Today's Date: 08/08/2022    END OF SESSION:   PT End of Session - 08/08/22 2003     Visit Number 8    Date for PT Re-Evaluation 08/17/22    Authorization Type BC/BS    PT Start Time 1146    PT Stop Time 1228    PT Time Calculation (min) 42 min    Activity Tolerance Patient tolerated treatment well;No increased pain    Behavior During Therapy WFL for tasks assessed/performed                  Past Medical History:  Diagnosis Date   Back pain    GERD (gastroesophageal reflux disease)    resolved    H/O blood clots    High cholesterol    Hypertension    Hypothyroidism    Joint pain    PONV (postoperative nausea and vomiting)    mild nausea responsive to meds    Pulmonary embolism ([place]) 10/2017   Thyroid carcinoma ([place])    Vitamin D deficiency    Past Surgical History:  Procedure Laterality Date   broken wrist     KNEE SURGERY     arthroscopy    THYROIDECTOMY Right 07/24/2018   Procedure: RIGHT THYROID LOBECTOMY  AND ISTHMUS;  Surgeon: Armandina Gemma, MD;  Location: [facility];  Service: General;  Laterality: Right;   Patient Active Problem List   Diagnosis Date Noted   Insulin resistance 07/17/2022   BMI 40.0-44.9, adult ([place]) 07/17/2022   Obesity, Beginning BMI 46.99 07/17/2022   Sacroiliitis ([place]) 06/19/2022   Stress 04/24/2022   Other specified hypothyroidism 04/02/2022   Depression 03/20/2022   Class 3 severe obesity with serious comorbidity and body mass index (BMI) of 45.0 to 49.9 in adult ([place]) 03/20/2022   Vitamin D deficiency 03/06/2022   History of pulmonary embolism 11/28/2021   Hypercholesterolemia 04/27/2021   Hypertensive disorder 04/27/2021   Shingles of eyelid 02/22/2020   Postsurgical hypothyroidism 10/28/2018   Papillary thyroid carcinoma ([place]) 10/28/2018    PCP: Elby Showers, MD  REFERRING PROVIDER:  Starlyn Skeans, MD  REFERRING DIAG: M46.1 (ICD-10-CM) - Sacroiliitis ([place])  THERAPY DIAG:  No diagnosis found.  Rationale for Evaluation and Treatment: Rehabilitation  ONSET DATE: 6-8 months ago  SUBJECTIVE:   SUBJECT:  Pt states that she has not had any SI pain for about 4-5 days. She has been trying to get back to her normal routine and would like to get back to the elliptical at the gym.    PERTINENT HISTORY: bilat pulmonary embolism on 11/18/2017, thyroid cancer s/p right thyroidectomy 07/24/2018, elevated BMI, HTN  PAIN:  Are you having pain? Yes: NPRS scale: 0/10 Pain location: left SI Pain description: sore, occasional sharp pains with certain movements Aggravating factors: bending, sitting Relieving factors: standing, lying flat  PRECAUTIONS: None  WEIGHT BEARING RESTRICTIONS: No  FALLS:  Has patient fallen in last 6 months? No  LIVING ENVIRONMENT: Lives with: lives alone Lives in: House/apartment Stairs: Yes: Internal: 15 steps; can reach both and External: 3-4 steps; can reach both Has following equipment at home: Single point cane, Crutches, and None  OCCUPATION: Surgeon, does robotic surgery  PLOF: Independent, Vocation/Vocational requirements: sitting with robotic surgery, and Leisure: gardening  PATIENT GOALS: To no longer have pain.  NEXT MD VISIT: as needed  OBJECTIVE:   DIAGNOSTIC FINDINGS: n/a  PATIENT SURVEYS:  FOTO 46% (projected 68% by visit 13)  COGNITION: Overall cognitive status: Within functional limits for tasks assessed     SENSATION: Pt reports when she is on treadmill or elliptical, has numbness on 3rd and 4th toes (primarily on left)  MUSCLE LENGTH: Hamstrings: Right 80 deg; Left 75 deg [place] test: increased tightness on right side  PALPATION: Tenderness to palpation along SI joint  LOWER EXTREMITY ROM: WFL  LOWER EXTREMITY MMT:  Left hip strength 4/5, Right is WFL   LOWER EXTREMITY SPECIAL TESTS:  Hip special  tests: SI distraction test: positive   FUNCTIONAL TESTS:  06/21/2021: 30 seconds chair stand test 14 stands with discomfort in SI joint in left  GAIT: Distance walked: >500 ft Assistive device utilized: None Level of assistance: Complete Independence Comments: denies pain with in clinic ambulation   TODAY'S TREATMENT:    DATE:    08/08/22 Sidelying clam red TB x10 each Sidelying reverse clam 5x5 sec hold  Supine bridge 2x10, red TB added to 2nd set Sidestep yellow TB around feet 2x10 reps  Standing hip extension x10 each  Standing BUE press down blue TB 2x10 reps Supine hip 90/90 rotation stretch x10 Half kneel hip flexor stretch 2x30 sec each side  08/07/2022  Pt seen for aquatic therapy today.  Treatment took place in water 3.5-4.75 ft in depth at the [facility] pool. Temp of water was 91.  Pt entered/exited the pool via stairs independently with single rail.   * walking forward/backward and side stepping without support *L stretch then L stretch with tail wagging * Holding wall: single leg clams x 10 x 2 each LE * TrA set with noodle pull down to thighs using yellow noodle le staggered then wide stance x 10 * staggered stance and wide stance with kick board push/pull x 15 each LE forward x 2 sets * wide stance with gentle trunk rotation  * plank with arm lifts using yellow noodle x10 *Planks with leg lifts x 10 *seated on  noodle: ant/post pelvic tilts; hip hiking *Side lunges ue add/abd with yellow hand buoys x 4 widths     DATE:  07/26/2022  Standing hamstring stretch at step 2x20 sec bilat Standing hip flexor stretch with reach at step x10 bilat Nustep level 3 x5 min with PT present to discuss status Sidelying clamshell and reverse clamshell x10 bilat Lower trunk rotation x10 bilat Trigger Point Dry-Needling  Treatment instructions: Expect mild to moderate muscle soreness. S/S of pneumothorax if dry needled over a lung field, and to seek immediate medical  attention should they occur. Patient verbalized understanding of these instructions and education. Patient Consent Given: Yes Education handout provided: Previously provided Muscles treated: Bil lumbar multifidi, bilateral piriformis Electrical stimulation performed: No Parameters: N/A Treatment response/outcome: Utilized skilled palpation to identify trigger points.  Able to palpate twitch response and muscle elongation following. Manual Therapy: elongation and release to bil back and gluteals.    DATE:  07/25/2022  Pt seen for aquatic therapy today.  Treatment took place in water 3.25-4.5 ft in depth at the [facility]. Temp of water was 91.  Pt entered/exited the pool via stairs independently with single rail.  * walking forward/backward and side stepping without support *L stretch then L stretch with tail wagging *hip flex and quad stretch standing using noodle *Hamstring, gastroc, adductor and IT band stretching standing using noodle  *Plank with hip extension glute contraction end range *KB press 5 x 10s *KB row feet staggered then wide stance x 12 reps ea position KB resisted core rotation R/L x 8 Core strengthening using hand bells with oscillations in sagittal and frontal planes x5-10 quick then slow 2-3 sets in ea position * walking forward/backward with single yellow hand float at side with core engaged * holding wall 1/2 diamond and reverse 1/2 diamond (slight limited in R external hip rotation today)  PATIENT EDUCATION:  Education details: updates to HEP; gradual return to elliptical machine Person educated: Patient Education method: Explanation, Demonstration, Education comprehension: verbalized understanding  HOME EXERCISE PROGRAM: Access Code: [email or web address] URL: [email or web address] Date: 08/08/2022 Prepared by: [place] with Resistance  - 1 x daily - 7 x weekly - 3 sets - 10 reps - Side Stepping with Resistance at Feet  - 1 x daily - 7 x weekly - 3 sets - 10 reps - Sidelying Reverse Clamshell  - 1 x daily - 7 x weekly - 1 sets - 10 reps - 5-10 hold - Seated Piriformis Stretch  - 1 x daily - 7 x weekly - 1 sets - 2 reps - 20 sec hold - Half Kneeling Hip Flexor Stretch  - 1 x daily - 7 x weekly - 1 sets - 2 reps - 20 sec hold - Standing Hamstring Stretch on Chair  - 1 x daily - 7 x weekly - 1 sets - 2 reps - 20 sec hold - Shoulder Extension with Resistance  - 1 x daily - 7 x weekly - 3 sets - 10 reps - Standing 'L' Stretch at Counter  - 1 x daily - 7 x weekly - 3 sets - 10 reps - Plank on Long Hand Float  - 1 x daily - 7 x weekly - 3 sets - 10 reps - Plank on Long [employer] with Arm Lifts  - 1 x daily - 7 x weekly - 3 sets - 10 reps - Side Stepping with Hand Floats  - 1 x daily - 7 x weekly - 3 sets - 10 reps  ASSESSMENT:  CLINICAL IMPRESSION:  Pt's pain has been fairly mild over the last 4-5 days. Session focused on adding resistance bands and hip strengthening exercises to improve core strength. Pt required some modifications to sets and reps in order to avoid exacerbation of Rt hip pain. PT made several updates to pt's HEP and discussed gradual return to elliptical at the gym. Pt had some mild low back discomfort which was improved although not resolved with standing repeated extension x10 reps. Will review goals at next visit.    OBJECTIVE IMPAIRMENTS: decreased strength, increased muscle spasms, impaired flexibility, and pain.   ACTIVITY LIMITATIONS: bending and sitting  PARTICIPATION LIMITATIONS: community activity and occupation  PERSONAL FACTORS: Time since onset of injury/illness/exacerbation are also affecting patient's functional outcome.   REHAB POTENTIAL: Good  CLINICAL DECISION MAKING:  Stable/uncomplicated  EVALUATION COMPLEXITY: Low   GOALS: Goals reviewed  with patient? Yes  SHORT TERM GOALS: Target date: 07/12/2022 Pt will be independent with initial HEP. Baseline: Goal status: MET  2.  Patient will report at least a 30% improvements in functional mobility. Baseline: 80% (07/26/22) Goal status: MET   LONG TERM GOALS: Target date: 08/17/2022  Patient will be independent with advanced HEP. Baseline:  Goal status: INITIAL  2.  Patient will be able to work a shift of work without increased SI pain. Baseline:  Goal status: INITIAL  3.  Patient will be able to exercise in the gym without increased SI pain. Baseline:  Goal status: INITIAL  4.  Patient will increase FOTO to at least 68% to demonstrate improvements in functional mobility. Baseline: 46% Goal status: INITIAL  5.  Patient will increase left hip strength to at least 4+/5 to allow her to perform functional tasks with increased ease. Baseline:  Goal status: INITIAL   PLAN:  PT FREQUENCY: 1-2x/week  PT DURATION: 8 weeks  PLANNED INTERVENTIONS: Therapeutic exercises, Therapeutic activity, Neuromuscular re-education, Balance training, Gait training, Patient/Family education, Self Care, Joint mobilization, Joint manipulation, Stair training, Aquatic Therapy, Dry Needling, Electrical stimulation, Spinal manipulation, Spinal mobilization, Cryotherapy, Moist heat, Taping, Traction, Ultrasound, Ionotophoresis 76m/ml Dexamethasone, Manual therapy, and Re-evaluation  PLAN FOR NEXT SESSION: FOTO; assess and progress HEP as indicated, aquatic PT, dry needling/manual therapy as indicated, core stability  8:12 PM,08/08/22 SSherol DadePT, DPT [facility] [place]

## 2022-08-13 NOTE — Therapy (Signed)
OUTPATIENT PHYSICAL THERAPY TREATMENT NOTE   Patient Name: Penny Tumulty, MD MRN: [id number] DOB:08-16-1980, 42 y.o., female Today's Date: 08/14/2022    END OF SESSION:   PT End of Session - 08/14/22 1719     Visit Number 9    Date for PT Re-Evaluation 08/17/22    Authorization Type BC/BS    PT Start Time 1700    PT Stop Time 1745    PT Time Calculation (min) 45 min    Activity Tolerance Patient tolerated treatment well    Behavior During Therapy [facility] for tasks assessed/performed               PT End of Session - 08/14/22 1719     Visit Number 9    Date for PT Re-Evaluation 08/17/22    Authorization Type BC/BS    PT Start Time 1700    PT Stop Time 1745    PT Time Calculation (min) 45 min    Activity Tolerance Patient tolerated treatment well    Behavior During Therapy WFL for tasks assessed/performed               Past Medical History:  Diagnosis Date   Back pain    GERD (gastroesophageal reflux disease)    resolved    H/O blood clots    High cholesterol    Hypertension    Hypothyroidism    Joint pain    PONV (postoperative nausea and vomiting)    mild nausea responsive to meds    Pulmonary embolism ([place]) 10/2017   Thyroid carcinoma ([place])    Vitamin D deficiency    Past Surgical History:  Procedure Laterality Date   broken wrist     KNEE SURGERY     arthroscopy    THYROIDECTOMY Right 07/24/2018   Procedure: RIGHT THYROID LOBECTOMY  AND ISTHMUS;  Surgeon: Armandina Gemma, MD;  Location: [facility];  Service: General;  Laterality: Right;   Patient Active Problem List   Diagnosis Date Noted   Obesity, Beginning BMI 46.99 08/14/2022   Insulin resistance 07/17/2022   BMI 40.0-44.9, adult ([place]) 07/17/2022   Obesity, Beginning BMI 46.99 07/17/2022   Sacroiliitis ([place]) 06/19/2022   Stress 04/24/2022   Other specified hypothyroidism 04/02/2022   Depression 03/20/2022   Class 3 severe obesity with serious comorbidity and body mass index (BMI) of  45.0 to 49.9 in adult ([place]) 03/20/2022   Vitamin D deficiency 03/06/2022   History of pulmonary embolism 11/28/2021   Hypercholesterolemia 04/27/2021   Hypertensive disorder 04/27/2021   Shingles of eyelid 02/22/2020   Postsurgical hypothyroidism 10/28/2018   Papillary thyroid carcinoma ([place]) 10/28/2018    PCP: Elby Showers, MD  REFERRING PROVIDER: Starlyn Skeans, MD  REFERRING DIAG: M46.1 (ICD-10-CM) - Sacroiliitis ([place])  THERAPY DIAG:  Muscle weakness (generalized)  Cramp and spasm  SI (sacroiliac) pain  Rationale for Evaluation and Treatment: Rehabilitation  ONSET DATE: 6-8 months ago  SUBJECTIVE:   SUBJECT:  Pt states that she sat all day Monday and her SI pain flared.   PERTINENT HISTORY: bilat pulmonary embolism on 11/18/2017, thyroid cancer s/p right thyroidectomy 07/24/2018, elevated BMI, HTN  PAIN:  Are you having pain? Yes: NPRS scale: 0/10 Pain location: left SI Pain description: sore, occasional sharp pains with certain movements Aggravating factors: bending, sitting Relieving factors: standing, lying flat  PRECAUTIONS: None  WEIGHT BEARING RESTRICTIONS: No  FALLS:  Has patient fallen in last 6 months? No  LIVING ENVIRONMENT: Lives with: lives alone Lives in:  House/apartment Stairs: Yes: Internal: 15 steps; can reach both and External: 3-4 steps; can reach both Has following equipment at home: Single point cane, Crutches, and None  OCCUPATION: Surgeon, does robotic surgery  PLOF: Independent, Vocation/Vocational requirements: sitting with robotic surgery, and Leisure: gardening  PATIENT GOALS: To no longer have pain.  NEXT MD VISIT: as needed  OBJECTIVE:   DIAGNOSTIC FINDINGS: n/a  PATIENT SURVEYS:  FOTO 46% (projected 68% by visit 13)  COGNITION: Overall cognitive status: Within functional limits for tasks assessed     SENSATION: Pt reports when she is on treadmill or elliptical, has numbness on 3rd and 4th toes (primarily on  left)  MUSCLE LENGTH: Hamstrings: Right 80 deg; Left 75 deg Magos test: increased tightness on right side  PALPATION: Tenderness to palpation along SI joint  LOWER EXTREMITY ROM: WFL  LOWER EXTREMITY MMT:  Left hip strength 4/5, Right is WFL   LOWER EXTREMITY SPECIAL TESTS:  Hip special tests: SI distraction test: positive   FUNCTIONAL TESTS:  06/21/2021: 30 seconds chair stand test 14 stands with discomfort in SI joint in left  GAIT: Distance walked: >500 ft Assistive device utilized: None Level of assistance: Complete Independence Comments: denies pain with in clinic ambulation   TODAY'S TREATMENT:    DATE:    08/14/22  Pt seen for aquatic therapy today.  Treatment took place in water 3.5-4.75 ft in depth at the [facility] pool. Temp of water was 91.  Pt entered/exited the pool via stairs independently with single rail.   * walking forward/backward and side stepping without support *L stretch then L stretch with tail wagging * Hamstring stretch *Side lunges ue add/abd with yellow hand buoys x 4 widths * TrA set with noodle pull down to thighs using yellow noodle le staggered then wide stance x 10 * staggered stance and wide stance with kick board push/pull x 15 each LE forward x 2 sets *seated on  noodle: ant/post pelvic tilts; hip hiking *Planks using barbell * plank with arm lifts using yellow hand buoys x10 *Planks with leg lifts x 10    08/08/22 Sidelying clam red TB x10 each Sidelying reverse clam 5x5 sec hold  Supine bridge 2x10, red TB added to 2nd set Sidestep yellow TB around feet 2x10 reps  Standing hip extension x10 each  Standing BUE press down blue TB 2x10 reps Supine hip 90/90 rotation stretch x10 Half kneel hip flexor stretch 2x30 sec each side  08/07/2022  Pt seen for aquatic therapy today.  Treatment took place in water 3.5-4.75 ft in depth at the [facility] pool. Temp of water was 91.  Pt entered/exited the pool via  stairs independently with single rail.   * walking forward/backward and side stepping without support *L stretch then L stretch with tail wagging * Holding wall: single leg clams x 10 x 2 each LE * TrA set with noodle pull down to thighs using yellow noodle le staggered then wide stance x 10 * staggered stance and wide stance with kick board push/pull x 15 each LE forward x 2 sets * wide stance with gentle trunk rotation  * plank with arm lifts using yellow noodle x10 *Planks with leg lifts x 10 *seated on  noodle: ant/post pelvic tilts; hip hiking *Side lunges ue add/abd with yellow hand buoys x 4 widths     DATE:  07/26/2022  Standing hamstring stretch at step 2x20 sec bilat Standing hip flexor stretch with reach at step x10 bilat Nustep level  3 x5 min with PT present to discuss status Sidelying clamshell and reverse clamshell x10 bilat Lower trunk rotation x10 bilat Trigger Point Dry-Needling  Treatment instructions: Expect mild to moderate muscle soreness. S/S of pneumothorax if dry needled over a lung field, and to seek immediate medical attention should they occur. Patient verbalized understanding of these instructions and education. Patient Consent Given: Yes Education handout provided: Previously provided Muscles treated: Bil lumbar multifidi, bilateral piriformis Electrical stimulation performed: No Parameters: N/A Treatment response/outcome: Utilized skilled palpation to identify trigger points.  Able to palpate twitch response and muscle elongation following. Manual Therapy: elongation and release to bil back and gluteals.    DATE:  07/25/2022                                                                                                                            Pt seen for aquatic therapy today.  Treatment took place in water 3.25-4.5 ft in depth at the [facility]. Temp of water was 91.  Pt entered/exited the pool via stairs independently with single  rail.  * walking forward/backward and side stepping without support *L stretch then L stretch with tail wagging *hip flex and quad stretch standing using noodle *Hamstring, gastroc, adductor and IT band stretching standing using noodle  *Plank with hip extension glute contraction end range *KB press 5 x 10s *KB row feet staggered then wide stance x 12 reps ea position KB resisted core rotation R/L x 8 Core strengthening using hand bells with oscillations in sagittal and frontal planes x5-10 quick then slow 2-3 sets in ea position * walking forward/backward with single yellow hand float at side with core engaged * holding wall 1/2 diamond and reverse 1/2 diamond (slight limited in R external hip rotation today)  PATIENT EDUCATION:  Education details: updates to HEP; gradual return to elliptical machine Person educated: Patient Education method: Explanation, Demonstration, Education comprehension: verbalized understanding  HOME EXERCISE PROGRAM: Access Code: [email or web address] URL: [email or web address] Date: 08/08/2022 Prepared by: [facility]  Exercises - Clam with Resistance  - 1 x daily - 7 x weekly - 3 sets - 10 reps - Side Stepping with Resistance at Feet  - 1 x daily - 7 x weekly - 3 sets - 10 reps - Sidelying Reverse Clamshell  - 1 x daily - 7 x weekly - 1 sets - 10 reps - 5-10 hold - Seated Piriformis Stretch  - 1 x daily - 7 x weekly - 1 sets - 2 reps - 20 sec hold - Half Kneeling Hip Flexor Stretch  - 1 x daily - 7 x weekly - 1 sets - 2 reps - 20 sec hold - Standing Hamstring Stretch on Chair  - 1 x daily - 7 x weekly - 1 sets - 2 reps - 20 sec hold - Shoulder Extension with Resistance  - 1 x daily - 7 x weekly - 3 sets -  10 reps - Standing 'L' Stretch at Counter  - 1 x daily - 7 x weekly - 3 sets - 10 reps - Plank on Long Hand Float  - 1 x daily - 7 x weekly - 3 sets - 10 reps - Plank on Long [employer] with Arm Lifts  -  1 x daily - 7 x weekly - 3 sets - 10 reps - Side Stepping with Hand Floats  - 1 x daily - 7 x weekly - 3 sets - 10 reps  ASSESSMENT:  CLINICAL IMPRESSION: Spike in SI pain yesterday after sitting all day. She is instructed through aquatic HEP which is laminated. She is given variations of most exercises to increase or decrease intensity. She completes well requiring minor vc for positioning and execution. This is last aquatic session. She has reached her max potential in setting. Goals on going     OBJECTIVE IMPAIRMENTS: decreased strength, increased muscle spasms, impaired flexibility, and pain.   ACTIVITY LIMITATIONS: bending and sitting  PARTICIPATION LIMITATIONS: community activity and occupation  PERSONAL FACTORS: Time since onset of injury/illness/exacerbation are also affecting patient's functional outcome.   REHAB POTENTIAL: Good  CLINICAL DECISION MAKING: Stable/uncomplicated  EVALUATION COMPLEXITY: Low   GOALS: Goals reviewed with patient? Yes  SHORT TERM GOALS: Target date: 07/12/2022 Pt will be independent with initial HEP. Baseline: Goal status: MET  2.  Patient will report at least a 30% improvements in functional mobility. Baseline: 80% (07/26/22) Goal status: MET   LONG TERM GOALS: Target date: 08/17/2022  Patient will be independent with advanced HEP. Baseline:  Goal status: Met in aquatics 08/14/22. Ongoing land based  2.  Patient will be able to work a shift of work without increased SI pain. Baseline:  Goal status: INITIAL  3.  Patient will be able to exercise in the gym without increased SI pain. Baseline:  Goal status: INITIAL  4.  Patient will increase FOTO to at least 68% to demonstrate improvements in functional mobility. Baseline: 46% Goal status: INITIAL  5.  Patient will increase left hip strength to at least 4+/5 to allow her to perform functional tasks with increased ease. Baseline:  Goal status: INITIAL   PLAN:  PT FREQUENCY:  1-2x/week  PT DURATION: 8 weeks  PLANNED INTERVENTIONS: Therapeutic exercises, Therapeutic activity, Neuromuscular re-education, Balance training, Gait training, Patient/Family education, Self Care, Joint mobilization, Joint manipulation, Stair training, Aquatic Therapy, Dry Needling, Electrical stimulation, Spinal manipulation, Spinal mobilization, Cryotherapy, Moist heat, Taping, Traction, Ultrasound, Ionotophoresis '4mg'$ /ml Dexamethasone, Manual therapy, and Re-evaluation  PLAN FOR NEXT SESSION: FOTO; assess and progress HEP as indicated, aquatic PT, dry needling/manual therapy as indicated, core stability  Annamarie Major) Babita Amaker MPT 5:21 PM,08/14/22

## 2022-08-14 ENCOUNTER — Encounter (INDEPENDENT_AMBULATORY_CARE_PROVIDER_SITE_OTHER): Payer: Self-pay | Admitting: Family Medicine

## 2022-08-14 ENCOUNTER — Ambulatory Visit (HOSPITAL_BASED_OUTPATIENT_CLINIC_OR_DEPARTMENT_OTHER): Payer: BC Managed Care – PPO | Admitting: Physical Therapy

## 2022-08-14 ENCOUNTER — Ambulatory Visit (INDEPENDENT_AMBULATORY_CARE_PROVIDER_SITE_OTHER): Payer: BC Managed Care – PPO | Admitting: Family Medicine

## 2022-08-14 ENCOUNTER — Encounter (HOSPITAL_BASED_OUTPATIENT_CLINIC_OR_DEPARTMENT_OTHER): Payer: Self-pay | Admitting: Physical Therapy

## 2022-08-14 VITALS — BP 117/83 | HR 54 | Temp 97.6°F | Ht 67.0 in | Wt 266.0 lb

## 2022-08-14 DIAGNOSIS — R252 Cramp and spasm: Secondary | ICD-10-CM

## 2022-08-14 DIAGNOSIS — M6281 Muscle weakness (generalized): Secondary | ICD-10-CM

## 2022-08-14 DIAGNOSIS — E88819 Insulin resistance, unspecified: Secondary | ICD-10-CM | POA: Diagnosis not present

## 2022-08-14 DIAGNOSIS — E669 Obesity, unspecified: Secondary | ICD-10-CM | POA: Diagnosis not present

## 2022-08-14 DIAGNOSIS — Z6841 Body Mass Index (BMI) 40.0 and over, adult: Secondary | ICD-10-CM | POA: Diagnosis not present

## 2022-08-14 DIAGNOSIS — E559 Vitamin D deficiency, unspecified: Secondary | ICD-10-CM | POA: Diagnosis not present

## 2022-08-14 DIAGNOSIS — M533 Sacrococcygeal disorders, not elsewhere classified: Secondary | ICD-10-CM

## 2022-08-14 MED ORDER — METFORMIN HCL 500 MG PO TABS: 500.0000 mg | ORAL_TABLET | Freq: Two times a day (BID) | ORAL | 0 refills | Status: DC

## 2022-08-14 MED ORDER — VITAMIN D (ERGOCALCIFEROL) 1.25 MG (50000 UNIT) PO CAPS: 50000.0000 [IU] | ORAL_CAPSULE | ORAL | 0 refills | Status: DC

## 2022-08-21 ENCOUNTER — Ambulatory Visit: Payer: BC Managed Care – PPO | Attending: Family Medicine | Admitting: Physical Therapy

## 2022-08-21 ENCOUNTER — Encounter: Payer: Self-pay | Admitting: Physical Therapy

## 2022-08-21 DIAGNOSIS — M6281 Muscle weakness (generalized): Secondary | ICD-10-CM | POA: Diagnosis not present

## 2022-08-21 DIAGNOSIS — R252 Cramp and spasm: Secondary | ICD-10-CM | POA: Insufficient documentation

## 2022-08-21 DIAGNOSIS — H5213 Myopia, bilateral: Secondary | ICD-10-CM | POA: Diagnosis not present

## 2022-08-21 DIAGNOSIS — M533 Sacrococcygeal disorders, not elsewhere classified: Secondary | ICD-10-CM | POA: Diagnosis not present

## 2022-08-21 DIAGNOSIS — H04123 Dry eye syndrome of bilateral lacrimal glands: Secondary | ICD-10-CM | POA: Diagnosis not present

## 2022-08-21 NOTE — Therapy (Signed)
OUTPATIENT PHYSICAL THERAPY TREATMENT NOTE/DISCHARGE   Patient Name: Penny Mond, MD MRN: [id number] DOB:1980-09-07, 42 y.o., female Today's Date: 08/21/2022    END OF SESSION:   PT End of Session - 08/21/22 1619     Visit Number 10    Date for PT Re-Evaluation 08/17/22    Authorization Type BC/BS    PT Start Time 1615    PT Stop Time 1658    PT Time Calculation (min) 43 min    Activity Tolerance Patient tolerated treatment well    Behavior During Therapy WFL for tasks assessed/performed                  Past Medical History:  Diagnosis Date   Back pain    GERD (gastroesophageal reflux disease)    resolved    H/O blood clots    High cholesterol    Hypertension    Hypothyroidism    Joint pain    PONV (postoperative nausea and vomiting)    mild nausea responsive to meds    Pulmonary embolism ([place]) 10/2017   Thyroid carcinoma ([place])    Vitamin D deficiency    Past Surgical History:  Procedure Laterality Date   broken wrist     KNEE SURGERY     arthroscopy    THYROIDECTOMY Right 07/24/2018   Procedure: RIGHT THYROID LOBECTOMY  AND ISTHMUS;  Surgeon: Armandina Gemma, MD;  Location: [facility];  Service: General;  Laterality: Right;   Patient Active Problem List   Diagnosis Date Noted   Obesity, Beginning BMI 46.99 08/14/2022   Insulin resistance 07/17/2022   BMI 40.0-44.9, adult ([place]) 07/17/2022   Obesity, Beginning BMI 46.99 07/17/2022   Sacroiliitis ([place]) 06/19/2022   Stress 04/24/2022   Other specified hypothyroidism 04/02/2022   Depression 03/20/2022   Class 3 severe obesity with serious comorbidity and body mass index (BMI) of 45.0 to 49.9 in adult ([place]) 03/20/2022   Vitamin D deficiency 03/06/2022   History of pulmonary embolism 11/28/2021   Hypercholesterolemia 04/27/2021   Hypertensive disorder 04/27/2021   Shingles of eyelid 02/22/2020   Postsurgical hypothyroidism 10/28/2018   Papillary thyroid carcinoma ([place]) 10/28/2018    PCP:  Elby Showers, MD  REFERRING PROVIDER: Starlyn Skeans, MD  REFERRING DIAG: M46.1 (ICD-10-CM) - Sacroiliitis ([place])  THERAPY DIAG:  Muscle weakness (generalized)  Cramp and spasm  SI (sacroiliac) pain  Rationale for Evaluation and Treatment: Rehabilitation  ONSET DATE: 6-8 months ago  SUBJECTIVE:   SUBJECT:  Pt states that she did more home projects/painting over the weekend. Overall, she feels 85% improvement. She feels she is ready for discharge and would like to condense her HEP.    PERTINENT HISTORY: bilat pulmonary embolism on 11/18/2017, thyroid cancer s/p right thyroidectomy 07/24/2018, elevated BMI, HTN  PAIN:  Are you having pain? Yes: NPRS scale: 0/10 Pain location: B SI Pain description: occasional sharp with getting into the car when planting the leg down  Aggravating factors: Relieving factors: standing, lying flat  PRECAUTIONS: None  WEIGHT BEARING RESTRICTIONS: No  FALLS:  Has patient fallen in last 6 months? No  LIVING ENVIRONMENT: Lives with: lives alone Lives in: House/apartment Stairs: Yes: Internal: 15 steps; can reach both and External: 3-4 steps; can reach both Has following equipment at home: Single point cane, Crutches, and None  OCCUPATION: Surgeon, does robotic surgery  PLOF: Independent, Vocation/Vocational requirements: sitting with robotic surgery, and Leisure: gardening  PATIENT GOALS: To no longer have pain.  NEXT MD VISIT: as needed  OBJECTIVE:   DIAGNOSTIC FINDINGS: n/a  PATIENT SURVEYS:  FOTO 46% (projected 68% by visit 13)    75% at d/c  COGNITION: Overall cognitive status: Within functional limits for tasks assessed     SENSATION: Pt reports when she is on treadmill or elliptical, has numbness on 3rd and 4th toes (primarily on left)  MUSCLE LENGTH: Hamstrings: Right 85 deg; Left 85 deg Ely's test: increased tightness on right side, WNL  PALPATION: Tenderness to palpation along SI joint; Rt piriformis   LOWER  EXTREMITY ROM: WFL  LOWER EXTREMITY MMT:  Rt/Lt hip abd 3+/5 MMT    LOWER EXTREMITY SPECIAL TESTS:  FABER- negative, Sacral compression- negative  FUNCTIONAL TESTS:  06/21/2021: 30 seconds chair stand test 14 stands with discomfort in SI joint in left  GAIT: Distance walked: >500 ft Assistive device utilized: None Level of assistance: Complete Independence Comments: denies pain with in clinic ambulation   TODAY'S TREATMENT:    DATE:   08/21/22 Updates to HEP-PT discussing how to progress exercises on her own Seated piriformis stretch with proximal nerve flossing demo Half kneel hip flexor/quad stretch demo Sidestepping yellow TB around knees demo Tandem pallof press blue TB x10 reps demo  08/14/22  Pt seen for aquatic therapy today.  Treatment took place in water 3.5-4.75 ft in depth at the [facility] pool. Temp of water was 91.  Pt entered/exited the pool via stairs independently with single rail.   * walking forward/backward and side stepping without support *L stretch then L stretch with tail wagging * Hamstring stretch *Side lunges ue add/abd with yellow hand buoys x 4 widths * TrA set with noodle pull down to thighs using yellow noodle le staggered then wide stance x 10 * staggered stance and wide stance with kick board push/pull x 15 each LE forward x 2 sets *seated on  noodle: ant/post pelvic tilts; hip hiking *Planks using barbell * plank with arm lifts using yellow hand buoys x10 *Planks with leg lifts x 10    08/08/22 Sidelying clam red TB x10 each Sidelying reverse clam 5x5 sec hold  Supine bridge 2x10, red TB added to 2nd set Sidestep yellow TB around feet 2x10 reps  Standing hip extension x10 each  Standing BUE press down blue TB 2x10 reps Supine hip 90/90 rotation stretch x10 Half kneel hip flexor stretch 2x30 sec each side  08/07/2022  Pt seen for aquatic therapy today.  Treatment took place in water 3.5-4.75 ft in depth at the [facility] pool. Temp of water was 91.  Pt entered/exited the pool via stairs independently with single rail.   * walking forward/backward and side stepping without support *L stretch then L stretch with tail wagging * Holding wall: single leg clams x 10 x 2 each LE * TrA set with noodle pull down to thighs using yellow noodle le staggered then wide stance x 10 * staggered stance and wide stance with kick board push/pull x 15 each LE forward x 2 sets * wide stance with gentle trunk rotation  * plank with arm lifts using yellow noodle x10 *Planks with leg lifts x 10 *seated on  noodle: ant/post pelvic tilts; hip hiking *Side lunges ue add/abd with yellow hand buoys x 4 widths     DATE:  07/26/2022  Standing hamstring stretch at step 2x20 sec bilat Standing hip flexor stretch with reach at step x10 bilat Nustep level 3 x5 min with PT present to discuss status Sidelying clamshell and reverse clamshell  x10 bilat Lower trunk rotation x10 bilat Trigger Point Dry-Needling  Treatment instructions: Expect mild to moderate muscle soreness. S/S of pneumothorax if dry needled over a lung field, and to seek immediate medical attention should they occur. Patient verbalized understanding of these instructions and education. Patient Consent Given: Yes Education handout provided: Previously provided Muscles treated: Bil lumbar multifidi, bilateral piriformis Electrical stimulation performed: No Parameters: N/A Treatment response/outcome: Utilized skilled palpation to identify trigger points.  Able to palpate twitch response and muscle elongation following. Manual Therapy: elongation and release to bil back and gluteals.    DATE:  07/25/2022                                                                                                                            Pt seen for aquatic therapy today.  Treatment took place in water 3.25-4.5 ft in depth at the [facility]. Temp of water was 91.   Pt entered/exited the pool via stairs independently with single rail.  * walking forward/backward and side stepping without support *L stretch then L stretch with tail wagging *hip flex and quad stretch standing using noodle *Hamstring, gastroc, adductor and IT band stretching standing using noodle  *Plank with hip extension glute contraction end range *KB press 5 x 10s *KB row feet staggered then wide stance x 12 reps ea position KB resisted core rotation R/L x 8 Core strengthening using hand bells with oscillations in sagittal and frontal planes x5-10 quick then slow 2-3 sets in ea position * walking forward/backward with single yellow hand float at side with core engaged * holding wall 1/2 diamond and reverse 1/2 diamond (slight limited in R external hip rotation today)  PATIENT EDUCATION:  Education details: updates to HEP and parameters following d/c  Person educated: Patient Education method: Consulting civil engineer, Demonstration, Education comprehension: verbalized understanding  HOME EXERCISE PROGRAM: Access Code: [email or web address] URL: [email or web address] Date: 08/21/2022 Prepared by: [facility]  Exercises - Seated Piriformis Stretch  - 2 x daily - 4 x weekly - 5 reps - Seated Piriformis Stretch with Trunk Bend  - 2 x daily - 4 x weekly - 5 reps - Half Kneeling Hip Flexor Stretch  - 2 x daily - 4 x weekly - 10 sec hold - Half Kneeling Hip Flexor Stretch with Chair  - 2 x daily - 4 x weekly - 10 seconds hold - Side Stepping with Resistance at Thighs  - 1 x daily - 3 x weekly - 10 reps - Standing Anti-Rotation Press with Anchored Resistance  - 1 x daily - 3 x weekly - 10 reps - Bridge  - 1 x daily - 3 x weekly - 2 sets - 10 reps  ASSESSMENT:  CLINICAL IMPRESSION: Pt finished aquatic PT last week. Overall, pt feels she is 85% improved from the start of PT. Notes minimal pain with her ADLs and typical work tasks. Pt still has  limited  hip abduction strength and quad/psoas tightness bilaterally. Despite, this she has met a majority of her goals. She feels that she is ready for discharge and was provided an updated HEP to allow her to further work towards her remaining strength/flexibility goals.      OBJECTIVE IMPAIRMENTS: decreased strength, increased muscle spasms, impaired flexibility, and pain.   ACTIVITY LIMITATIONS: bending and sitting  PARTICIPATION LIMITATIONS: community activity and occupation  PERSONAL FACTORS: Time since onset of injury/illness/exacerbation are also affecting patient's functional outcome.   REHAB POTENTIAL: Good  CLINICAL DECISION MAKING: Stable/uncomplicated  EVALUATION COMPLEXITY: Low   GOALS: Goals reviewed with patient? Yes  SHORT TERM GOALS: Target date: 07/12/2022 Pt will be independent with initial HEP. Baseline: Goal status: MET  2.  Patient will report at least a 30% improvements in functional mobility. Baseline: 80% (07/26/22) Goal status: MET   LONG TERM GOALS: Target date: 08/17/2022  Patient will be independent with advanced HEP. Baseline:  Goal status: Met in aquatics 08/14/22. Ongoing land based  2.  Patient will be able to work a shift of work without increased SI pain. Baseline: typical work shift is good  Goal status: MET  3.  Patient will be able to exercise in the gym without increased SI pain. Baseline: hasn't been back Goal status: INITIAL  4.  Patient will increase FOTO to at least 68% to demonstrate improvements in functional mobility. Baseline: 75% Goal status: MET  5.  Patient will increase left hip strength to at least 4+/5 to allow her to perform functional tasks with increased ease. Baseline:  Goal status: NOT MET   PLAN:  PT FREQUENCY: 1-2x/week  PT DURATION: 8 weeks  PLANNED INTERVENTIONS: Therapeutic exercises, Therapeutic activity, Neuromuscular re-education, Balance training, Gait training, Patient/Family education, Self  Care, Joint mobilization, Joint manipulation, Stair training, Aquatic Therapy, Dry Needling, Electrical stimulation, Spinal manipulation, Spinal mobilization, Cryotherapy, Moist heat, Taping, Traction, Ultrasound, Ionotophoresis '4mg'$ /ml Dexamethasone, Manual therapy, and Re-evaluation  PLAN FOR NEXT SESSION:   PHYSICAL THERAPY DISCHARGE SUMMARY  Visits from Start of Care: 10  Current functional level related to goals / functional outcomes: See above for more details    Remaining deficits: See above for more details    Education / Equipment: See above for more details    Patient agrees to discharge. Patient goals were partially met. Patient is being discharged due to being pleased with the current functional level.   7:52 PM,08/21/22 [place], [place] at [place]

## 2022-09-03 NOTE — Progress Notes (Signed)
Chief Complaint:   OBESITY Penny Hancock is here to discuss her progress with her obesity treatment plan along with follow-up of her obesity related diagnoses. Penny Hancock is on keeping a food journal and adhering to recommended goals of 1500 calories and 85+ grams of protein and states she is following her eating plan approximately 80% of the time. Penny Hancock states she is on the treadmill and lifting weights for 30 minutes 1 time per week.  Today's visit was #: 12 Starting weight: 300 lbs Starting date: 01/09/2022 Today's weight: 266 lbs Today's date: 08/14/2022 Total lbs lost to date: 34 Total lbs lost since last in-office visit: 6  Interim History: Penny Hancock has been sick with an upper respiratory infection and she is starting to feel better now.  She struggled to meet her protein goals, but she is doing better.  She is increasing her exercise again but her piriformis syndrome limits her at times.   Subjective:   1. Insulin resistance Penny Hancock is working on remembering metformin especially in the afternoon.  She remembers to take it in the morning better.  2. Vitamin D deficiency Penny Hancock is doing well on vitamin D, and she has minimal sun exposure lately.  Assessment/Plan:   1. Insulin resistance Penny Hancock agreed to increase metformin to 500 mg twice daily, with no refills.  We will recheck labs in 1 to 2 months.  - metFORMIN (GLUCOPHAGE) 500 MG tablet; Take 1 tablet (500 mg total) by mouth 2 (two) times daily with a meal.  Dispense: 60 tablet; Refill: 0  2. Vitamin D deficiency Penny Hancock will continue prescription vitamin D, and we will refill for 1 month.  We will recheck labs in 1 to 2 months.  - Vitamin D, Ergocalciferol, (DRISDOL) 1.25 MG (50000 UNIT) CAPS capsule; Take 1 capsule (50,000 Units total) by mouth once a week.  Dispense: 4 capsule; Refill: 0  3. BMI 40.0-44.9, adult (Penny Hancock)  4. Obesity, Beginning BMI 99991111 Penny Hancock is currently in the action stage of change. As such, her goal is to  continue with weight loss efforts. She has agreed to keeping a food journal and adhering to recommended goals of 1500 calories and 85+ grams of protein daily.   Exercise goals: All adults should avoid inactivity. Some physical activity is better than none, and adults who participate in any amount of physical activity gain some health benefits.  Behavioral modification strategies: increasing lean protein intake and meal planning and cooking strategies.  Penny Hancock has agreed to follow-up with our clinic in 4 weeks. She was informed of the importance of frequent follow-up visits to maximize her success with intensive lifestyle modifications for her multiple health conditions.   Objective:   Blood pressure 117/83, pulse (!) 54, temperature 97.6 F (36.4 C), height 5\' 7"  (1.702 m), weight 266 lb (120.7 kg), SpO2 100 %. Body mass index is 41.66 kg/m.  Lab Results  Component Value Date   CREATININE 0.80 01/09/2022   BUN 18 01/09/2022   NA 139 01/09/2022   K 4.8 01/09/2022   CL 103 01/09/2022   CO2 20 01/09/2022   Lab Results  Component Value Date   ALT 10 01/09/2022   AST 15 01/09/2022   ALKPHOS 63 01/09/2022   BILITOT 0.5 01/09/2022   Lab Results  Component Value Date   HGBA1C 5.1 07/10/2021   HGBA1C 5.0 08/17/2019   HGBA1C 5.1 04/30/2019   Lab Results  Component Value Date   INSULIN 8.0 01/09/2022   Lab Results  Component Value Date  TSH 1.27 03/20/2022   Lab Results  Component Value Date   CHOL 140 12/26/2021   HDL 54 12/26/2021   LDLCALC 72 12/26/2021   TRIG 63 12/26/2021   CHOLHDL 2.6 12/26/2021   Lab Results  Component Value Date   VD25OH 53.3 01/09/2022   VD25OH 59 07/10/2021   VD25OH 23 (L) 04/30/2019   Lab Results  Component Value Date   WBC 5.8 07/10/2021   HGB 14.9 07/10/2021   HCT 44.0 07/10/2021   MCV 95.9 07/10/2021   PLT 246 07/10/2021   No results found for: "IRON", "TIBC", "FERRITIN"  Attestation Statements:   Reviewed by clinician on day  of visit: allergies, medications, problem list, medical history, surgical history, family history, social history, and previous encounter notes.   I, Trixie Dredge, am acting as transcriptionist for Dennard Nip, MD.  I have reviewed the above documentation for accuracy and completeness, and I agree with the above. -  Dennard Nip, MD

## 2022-09-11 ENCOUNTER — Encounter (INDEPENDENT_AMBULATORY_CARE_PROVIDER_SITE_OTHER): Payer: Self-pay | Admitting: Family Medicine

## 2022-09-11 ENCOUNTER — Ambulatory Visit (INDEPENDENT_AMBULATORY_CARE_PROVIDER_SITE_OTHER): Payer: BC Managed Care – PPO | Admitting: Family Medicine

## 2022-09-11 VITALS — BP 115/76 | HR 62 | Temp 98.0°F | Ht 67.0 in | Wt 263.0 lb

## 2022-09-11 DIAGNOSIS — E7800 Pure hypercholesterolemia, unspecified: Secondary | ICD-10-CM | POA: Diagnosis not present

## 2022-09-11 DIAGNOSIS — Z6841 Body Mass Index (BMI) 40.0 and over, adult: Secondary | ICD-10-CM

## 2022-09-11 DIAGNOSIS — E88819 Insulin resistance, unspecified: Secondary | ICD-10-CM | POA: Diagnosis not present

## 2022-09-11 DIAGNOSIS — E559 Vitamin D deficiency, unspecified: Secondary | ICD-10-CM | POA: Diagnosis not present

## 2022-09-11 DIAGNOSIS — E669 Obesity, unspecified: Secondary | ICD-10-CM

## 2022-09-11 MED ORDER — METFORMIN HCL 500 MG PO TABS: 500.0000 mg | ORAL_TABLET | Freq: Two times a day (BID) | ORAL | 0 refills | Status: DC

## 2022-09-12 NOTE — Progress Notes (Signed)
Chief Complaint:   OBESITY Penny Hancock is here to discuss her progress with her obesity treatment plan along with follow-up of her obesity related diagnoses. Penny Hancock is on keeping a food journal and adhering to recommended goals of 1500 calories and 85+ grams of protein and states she is following her eating plan approximately 85% of the time. Penny Hancock states she is walking for 30 minutes 1-2 times per week.  Today's visit was #: 13 Starting weight: 300 lbs Starting date: 01/09/2022 Today's weight: 263 lbs Today's date: 09/11/2022 Total lbs lost to date: 37 Total lbs lost since last in-office visit: 3  Interim History: Penny Hancock continues to do well with weight loss and journaling. His hunger is more controlled and she is doing well with meal planning and prepping.   Subjective:   1. Insulin resistance Penny Hancock notes decreased polyphagia on metformin. Her hunger signals are more appropriate. She denies nausea, vomiting, or hypoglycemia.   2. Vitamin D deficiency Penny Hancock is on Vitamin D, and she is due for labs. No muscle weakness was noted.   3. Hypercholesterolemia Penny Hancock has done well with her diet and Crestor. She is due for labs. She denies chest pain or myalgias.  Assessment/Plan:   1. Insulin resistance We will check labs today, and we will refill metformin for 90 days.   - CMP14+EGFR - Insulin, random - Hemoglobin A1c - Vitamin B12 - metFORMIN (GLUCOPHAGE) 500 MG tablet; Take 1 tablet (500 mg total) by mouth 2 (two) times daily with a meal.  Dispense: 180 tablet; Refill: 0  2. Vitamin D deficiency We will check labs today, and Penny Hancock will continue her Vitamin D.   - VITAMIN D 25 Hydroxy (Vit-D Deficiency, Fractures)  3. Hypercholesterolemia We will check labs today, and Penny Hancock will continue her Crestor.   - Lipid Panel With LDL/HDL Ratio - TSH  4. BMI 40.0-44.9, adult (Penny Hancock)  5. Obesity, Beginning BMI [id number] Penny Hancock is currently in the action stage of change. As such,  her goal is to continue with weight loss efforts. She has agreed to keeping a food journal and adhering to recommended goals of 1500 calories and 85+ grams of protein daily.   Exercise goals: As is, add strengthening.  Behavioral modification strategies: meal planning and cooking strategies and celebration eating strategies.  Penny Hancock has agreed to follow-up with our clinic in 4 weeks. She was informed of the importance of frequent follow-up visits to maximize her success with intensive lifestyle modifications for her multiple health conditions.   Penny Hancock was informed we would discuss her lab results at her next visit unless there is a critical issue that needs to be addressed sooner. Penny Hancock agreed to keep her next visit at the agreed upon time to discuss these results.  Objective:   Blood pressure 115/76, pulse 62, temperature 98 F (36.7 C), height 5\' 7"  (1.702 m), weight 263 lb (119.3 kg), SpO2 97 %. Body mass index is 41.19 kg/m.  Lab Results  Component Value Date   CREATININE 0.80 01/09/2022   BUN 18 01/09/2022   NA 139 01/09/2022   K 4.8 01/09/2022   CL 103 01/09/2022   CO2 20 01/09/2022   Lab Results  Component Value Date   ALT 10 01/09/2022   AST 15 01/09/2022   ALKPHOS 63 01/09/2022   BILITOT 0.5 01/09/2022   Lab Results  Component Value Date   HGBA1C 5.1 07/10/2021   HGBA1C 5.0 08/17/2019   HGBA1C 5.1 04/30/2019   Lab Results  Component Value  Date   INSULIN 8.0 01/09/2022   Lab Results  Component Value Date   TSH 1.27 03/20/2022   Lab Results  Component Value Date   CHOL 140 12/26/2021   HDL 54 12/26/2021   LDLCALC 72 12/26/2021   TRIG 63 12/26/2021   CHOLHDL 2.6 12/26/2021   Lab Results  Component Value Date   VD25OH 53.3 01/09/2022   VD25OH 59 07/10/2021   VD25OH 23 (L) 04/30/2019   Lab Results  Component Value Date   WBC 5.8 07/10/2021   HGB 14.9 07/10/2021   HCT 44.0 07/10/2021   MCV 95.9 07/10/2021   PLT 246 07/10/2021   No results  found for: "IRON", "TIBC", "FERRITIN"  Attestation Statements:   Reviewed by clinician on day of visit: allergies, medications, problem list, medical history, surgical history, family history, social history, and previous encounter notes.   I, Trixie Dredge, am acting as transcriptionist for Dennard Nip, MD.  I have reviewed the above documentation for accuracy and completeness, and I agree with the above. -  Dennard Nip, MD

## 2022-09-13 ENCOUNTER — Other Ambulatory Visit (INDEPENDENT_AMBULATORY_CARE_PROVIDER_SITE_OTHER): Payer: Self-pay | Admitting: Family Medicine

## 2022-09-13 DIAGNOSIS — E88819 Insulin resistance, unspecified: Secondary | ICD-10-CM

## 2022-09-25 DIAGNOSIS — E88819 Insulin resistance, unspecified: Secondary | ICD-10-CM | POA: Diagnosis not present

## 2022-09-25 DIAGNOSIS — E559 Vitamin D deficiency, unspecified: Secondary | ICD-10-CM | POA: Diagnosis not present

## 2022-09-25 DIAGNOSIS — E7800 Pure hypercholesterolemia, unspecified: Secondary | ICD-10-CM | POA: Diagnosis not present

## 2022-09-26 LAB — HEMOGLOBIN A1C
Est. average glucose Bld gHb Est-mCnc: 108 mg/dL
Hgb A1c MFr Bld: 5.4 % (ref 4.8–5.6)

## 2022-09-26 LAB — CMP14+EGFR
ALT: 12 IU/L (ref 0–32)
AST: 13 IU/L (ref 0–40)
Albumin/Globulin Ratio: 1.9 (ref 1.2–2.2)
Albumin: 4 g/dL (ref 3.9–4.9)
Alkaline Phosphatase: 73 IU/L (ref 44–121)
BUN/Creatinine Ratio: 19 (ref 9–23)
BUN: 16 mg/dL (ref 6–24)
Bilirubin Total: 0.5 mg/dL (ref 0.0–1.2)
CO2: 19 mmol/L — ABNORMAL LOW (ref 20–29)
Calcium: 8.7 mg/dL (ref 8.7–10.2)
Chloride: 105 mmol/L (ref 96–106)
Creatinine, Ser: 0.84 mg/dL (ref 0.57–1.00)
Globulin, Total: 2.1 g/dL (ref 1.5–4.5)
Glucose: 93 mg/dL (ref 70–99)
Potassium: 4.5 mmol/L (ref 3.5–5.2)
Sodium: 138 mmol/L (ref 134–144)
Total Protein: 6.1 g/dL (ref 6.0–8.5)
eGFR: 88 mL/min/{1.73_m2} (ref >59)

## 2022-09-26 LAB — VITAMIN D 25 HYDROXY (VIT D DEFICIENCY, FRACTURES): Vit D, 25-Hydroxy: 51.3 ng/mL (ref 30.0–100.0)

## 2022-09-26 LAB — LIPID PANEL WITH LDL/HDL RATIO
Cholesterol, Total: 161 mg/dL (ref 100–199)
HDL: 50 mg/dL (ref >39)
LDL Chol Calc (NIH): 97 mg/dL (ref 0–99)
LDL/HDL Ratio: 1.9 ratio (ref 0.0–3.2)
Triglycerides: 75 mg/dL (ref 0–149)
VLDL Cholesterol Cal: 14 mg/dL (ref 5–40)

## 2022-09-26 LAB — TSH: TSH: 2.08 u[IU]/mL (ref 0.450–4.500)

## 2022-09-26 LAB — INSULIN, RANDOM: INSULIN: 7.8 u[IU]/mL (ref 2.6–24.9)

## 2022-09-26 LAB — VITAMIN B12: Vitamin B-12: 306 pg/mL (ref 232–1245)

## 2022-10-09 ENCOUNTER — Ambulatory Visit (INDEPENDENT_AMBULATORY_CARE_PROVIDER_SITE_OTHER): Payer: BC Managed Care – PPO | Admitting: Family Medicine

## 2022-10-09 ENCOUNTER — Encounter: Payer: Self-pay | Admitting: Internal Medicine

## 2022-10-09 ENCOUNTER — Encounter (INDEPENDENT_AMBULATORY_CARE_PROVIDER_SITE_OTHER): Payer: Self-pay | Admitting: Family Medicine

## 2022-10-09 VITALS — BP 128/78 | HR 61 | Temp 98.0°F | Ht 67.0 in | Wt 262.0 lb

## 2022-10-09 DIAGNOSIS — E538 Deficiency of other specified B group vitamins: Secondary | ICD-10-CM

## 2022-10-09 DIAGNOSIS — Z6841 Body Mass Index (BMI) 40.0 and over, adult: Secondary | ICD-10-CM

## 2022-10-09 DIAGNOSIS — E669 Obesity, unspecified: Secondary | ICD-10-CM | POA: Diagnosis not present

## 2022-10-09 DIAGNOSIS — E038 Other specified hypothyroidism: Secondary | ICD-10-CM

## 2022-10-09 DIAGNOSIS — E559 Vitamin D deficiency, unspecified: Secondary | ICD-10-CM | POA: Diagnosis not present

## 2022-10-10 NOTE — Progress Notes (Signed)
Chief Complaint:   OBESITY Penny Hancock is here to discuss her progress with her obesity treatment plan along with follow-up of her obesity related diagnoses. Penny Hancock is on keeping a food journal and adhering to recommended goals of 1500 calories and 85+ grams of protein and states she is following her eating plan approximately 85% of the time. Penny Hancock states she is on the treadmill for 30 minutes 2 times per week.  Today's visit was #: 14 Starting weight: 300 lbs Starting date: 01/09/2022 Today's weight: 262 lbs Today's date: 10/09/2022 Total lbs lost to date: 38 Total lbs lost since last in-office visit: 1  Interim History: Penny Hancock has been traveling for work. She ate more, but she also walked 6-8 miles per day. She is wondering if there is another meal plan that she could change to as she is getting bored with her current plan.   Subjective:   1. B12 deficiency Dezi's B12 level is below goal despite being on a B12 rich diet. She notes fatigue. I discussed labs with the patient today.   2. Vitamin D deficiency Penny Hancock's Vitamin D level is at goal on Vitamin D prescription. She denies nausea, vomiting, or muscle weakness. I discussed labs with the patient today.  3. Other specified hypothyroidism Penny Hancock is status post thyroid cancer and thyroidectomy. She is on levothyroxine. Her TSH is slightly above 2 (2.02), and she was told by Dr. Elvera Lennox to keep this level below 2. I discussed labs with the patient.   Assessment/Plan:   1. B12 deficiency Penny Hancock agreed to start B12 OTC 1,000 mcg per day, and we will recheck labs in 3 months.   2. Vitamin D deficiency Penny Hancock will continue prescription Vitamin D 50,000 IU once weekly, and we will recheck labs in 3 months.   3. Other specified hypothyroidism Penny Hancock was advised to contact Dr. Elvera Lennox about these results to see what adjustments that she wants to make.   4. BMI 40.0-44.9, adult  5. Obesity, Beginning BMI 46.99 Penny Hancock is currently  in the action stage of change. As such, her goal is to continue with weight loss efforts. She has agreed to change to following a lower carbohydrate, vegetable and lean protein rich diet plan.   Exercise goals: All adults should avoid inactivity. Some physical activity is better than none, and adults who participate in any amount of physical activity gain some health benefits.  Behavioral modification strategies: increasing lean protein intake, decreasing simple carbohydrates, and increasing vegetables.  Penny Hancock has agreed to follow-up with our clinic in 4 weeks. She was informed of the importance of frequent follow-up visits to maximize her success with intensive lifestyle modifications for her multiple health conditions.   Objective:   Blood pressure 128/78, pulse 61, temperature 98 F (36.7 C), height  (1.702 m), weight 262 lb (118.8 kg), SpO2 97 %. Body mass index is 41.04 kg/m.  Lab Results  Component Value Date   CREATININE 0.84 09/25/2022   BUN 16 09/25/2022   NA 138 09/25/2022   K 4.5 09/25/2022   CL 105 09/25/2022   CO2 19 (L) 09/25/2022   Lab Results  Component Value Date   ALT 12 09/25/2022   AST 13 09/25/2022   ALKPHOS 73 09/25/2022   BILITOT 0.5 09/25/2022   Lab Results  Component Value Date   HGBA1C 5.4 09/25/2022   HGBA1C 5.1 07/10/2021   HGBA1C 5.0 08/17/2019   HGBA1C 5.1 04/30/2019   Lab Results  Component Value Date   INSULIN  7.8 09/25/2022   INSULIN 8.0 01/09/2022   Lab Results  Component Value Date   TSH 2.080 09/25/2022   Lab Results  Component Value Date   CHOL 161 09/25/2022   HDL 50 09/25/2022   LDLCALC 97 09/25/2022   TRIG 75 09/25/2022   CHOLHDL 2.6 12/26/2021   Lab Results  Component Value Date   VD25OH 51.3 09/25/2022   VD25OH 53.3 01/09/2022   VD25OH 59 07/10/2021   Lab Results  Component Value Date   WBC 5.8 07/10/2021   HGB 14.9 07/10/2021   HCT 44.0 07/10/2021   MCV 95.9 07/10/2021   PLT 246 07/10/2021   No  results found for: "IRON", "TIBC", "FERRITIN"  Attestation Statements:   Reviewed by clinician on day of visit: allergies, medications, problem list, medical history, surgical history, family history, social history, and previous encounter notes.   I, Burt Knack, am acting as transcriptionist for Quillian Quince, MD.  I have reviewed the above documentation for accuracy and completeness, and I agree with the above. -  Quillian Quince, MD

## 2022-10-30 ENCOUNTER — Encounter (INDEPENDENT_AMBULATORY_CARE_PROVIDER_SITE_OTHER): Payer: Self-pay | Admitting: Family Medicine

## 2022-10-30 ENCOUNTER — Ambulatory Visit (INDEPENDENT_AMBULATORY_CARE_PROVIDER_SITE_OTHER): Payer: BC Managed Care – PPO | Admitting: Family Medicine

## 2022-10-30 VITALS — BP 119/75 | HR 71 | Temp 98.3°F | Ht 67.0 in | Wt 262.0 lb

## 2022-10-30 DIAGNOSIS — E669 Obesity, unspecified: Secondary | ICD-10-CM | POA: Diagnosis not present

## 2022-10-30 DIAGNOSIS — Z6841 Body Mass Index (BMI) 40.0 and over, adult: Secondary | ICD-10-CM | POA: Diagnosis not present

## 2022-10-30 DIAGNOSIS — R4689 Other symptoms and signs involving appearance and behavior: Secondary | ICD-10-CM | POA: Diagnosis not present

## 2022-10-30 DIAGNOSIS — F3289 Other specified depressive episodes: Secondary | ICD-10-CM

## 2022-10-30 MED ORDER — BUPROPION HCL ER (XL) 300 MG PO TB24
300.0000 mg | ORAL_TABLET | Freq: Every morning | ORAL | 0 refills | Status: DC
Start: 2022-10-30 — End: 2022-11-27

## 2022-10-31 NOTE — Progress Notes (Signed)
Chief Complaint:   OBESITY Penny Hancock is here to discuss her progress with her obesity treatment plan along with follow-up of her obesity related diagnoses. Penny Hancock is on following a lower carbohydrate, vegetable and lean protein rich diet plan and states she is following her eating plan approximately 90% of the time. Penny Hancock states she is walking for 30 minutes 2 times per week.  Today's visit was #: 15 Starting weight: 300 lbs Starting date: 01/09/2022 Today's weight: 262 lbs Today's date: 10/30/2022 Total lbs lost to date: 38 Total lbs lost since last in-office visit: 0  Interim History: Penny Hancock did some celebration eating over her birthday.  She was still mindful of her options and she was able to get back on track.  She is open to discussing food choices.  Subjective:   1. Emotional Eating Behavior Penny Hancock is on Wellbutrin but she is not sure how much it is helping her decrease emotional eating behavior at her current dose.  Assessment/Plan:   1. Emotional Eating Behavior Penny Hancock agreed to change Wellbutrin to XL 300 mg every morning with no refills.  We will follow-up at her next visit.  - buPROPion (WELLBUTRIN XL) 300 MG 24 hr tablet; Take 1 tablet (300 mg total) by mouth every morning.  Dispense: 30 tablet; Refill: 0  2. BMI 40.0-44.9, adult (HCC)  3. Obesity, Beginning BMI 46.99 Penny Hancock is currently in the action stage of change. As such, her goal is to continue with weight loss efforts. She has agreed to following a lower carbohydrate, vegetable and lean protein rich diet plan.   Various eating plan options were discussed.  The patient will decide between low carbohydrate, vegetarian, and pescatarian and tell me which 1 she chooses at our next follow-up visit.  Exercise goals: As is.   Behavioral modification strategies: increasing lean protein intake and increasing vegetables.  Penny Hancock has agreed to follow-up with our clinic in 4 weeks. She was informed of the importance  of frequent follow-up visits to maximize her success with intensive lifestyle modifications for her multiple health conditions.   Objective:   Blood pressure 119/75, pulse 71, temperature 98.3 F (36.8 C), height 5\' 7"  (1.702 m), weight 262 lb (118.8 kg), SpO2 98 %. Body mass index is 41.04 kg/m.  Lab Results  Component Value Date   CREATININE 0.84 09/25/2022   BUN 16 09/25/2022   NA 138 09/25/2022   K 4.5 09/25/2022   CL 105 09/25/2022   CO2 19 (L) 09/25/2022   Lab Results  Component Value Date   ALT 12 09/25/2022   AST 13 09/25/2022   ALKPHOS 73 09/25/2022   BILITOT 0.5 09/25/2022   Lab Results  Component Value Date   HGBA1C 5.4 09/25/2022   HGBA1C 5.1 07/10/2021   HGBA1C 5.0 08/17/2019   HGBA1C 5.1 04/30/2019   Lab Results  Component Value Date   INSULIN 7.8 09/25/2022   INSULIN 8.0 01/09/2022   Lab Results  Component Value Date   TSH 2.080 09/25/2022   Lab Results  Component Value Date   CHOL 161 09/25/2022   HDL 50 09/25/2022   LDLCALC 97 09/25/2022   TRIG 75 09/25/2022   CHOLHDL 2.6 12/26/2021   Lab Results  Component Value Date   VD25OH 51.3 09/25/2022   VD25OH 53.3 01/09/2022   VD25OH 59 07/10/2021   Lab Results  Component Value Date   WBC 5.8 07/10/2021   HGB 14.9 07/10/2021   HCT 44.0 07/10/2021   MCV 95.9 07/10/2021  PLT 246 07/10/2021   No results found for: "IRON", "TIBC", "FERRITIN"  Attestation Statements:   Reviewed by clinician on day of visit: allergies, medications, problem list, medical history, surgical history, family history, social history, and previous encounter notes.  Time spent on visit including pre-visit chart review and post-visit care and charting was 30 minutes.   I, Burt Knack, am acting as Energy manager for Quillian Quince, MD.  I have reviewed the above documentation for accuracy and completeness, and I agree with the above. -  Quillian Quince, MD

## 2022-11-13 ENCOUNTER — Telehealth: Payer: Self-pay | Admitting: Internal Medicine

## 2022-11-13 ENCOUNTER — Encounter: Payer: Self-pay | Admitting: Internal Medicine

## 2022-11-13 ENCOUNTER — Ambulatory Visit: Payer: BC Managed Care – PPO | Admitting: Internal Medicine

## 2022-11-13 VITALS — BP 108/68 | HR 73 | Ht 67.0 in | Wt 263.4 lb

## 2022-11-13 DIAGNOSIS — C73 Malignant neoplasm of thyroid gland: Secondary | ICD-10-CM

## 2022-11-13 DIAGNOSIS — E890 Postprocedural hypothyroidism: Secondary | ICD-10-CM

## 2022-11-13 MED ORDER — LEVOTHYROXINE SODIUM 75 MCG PO TABS: ORAL_TABLET | ORAL | 3 refills | Status: DC

## 2022-11-13 MED ORDER — METHYLPREDNISOLONE 4 MG PO TBPK
ORAL_TABLET | ORAL | 1 refills | Status: DC
Start: 2022-11-13 — End: 2022-12-25

## 2022-11-13 NOTE — Patient Instructions (Addendum)
Please come back for labs in 1.5 months.  Please increase levothyroxine to 75 mcg daily.  Take the thyroid hormone every day, with water, at least 30 minutes before breakfast, separated by at least 4 hours from: - acid reflux medications - calcium - iron - multivitamins  Let's schedule a neck ultrasound.  Please come back for a follow-up appointment in 1 year.

## 2022-11-13 NOTE — Progress Notes (Signed)
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 Gerrit Friends, returning for follow-up for thyroid cancer and postsurgical hypothyroidism.  Last visit 1 year ago.  Interim history: Before last visit, she had several episodes of anxiety >> during these, stopped LT4 for few days >> resolved >> restarted LT4.  Since last visit, her anxiety improved due to reduced stress at home. She has no complaints at today's visit. She did lose a significant amount of weight since last visit (40 pounds).  Reviewed history: Pt. has been dx with papillary thyroid cancer in 07/2018.   Patient was found to have a right thyroid nodule in [employer] (>1 cm).  FNA (2003): colloid  Chest CT angiogram (11/18/2017) performed during investigation for PE (which was positive): Approximately 1.5 cm low-attenuation nodule within the inferior aspect of the right thyroid gland with peripheral dystrophic calcifications. No evidence of mediastinal or hilar adenopathy.  She then had a thyroid ultrasound (04/09/2018): Location: Right; Inferior Maximum size: 1.8 x 1.7 x 1.5 cm Composition: solid/almost completely solid (2) Echogenicity: isoechoic (1) Echogenic foci: peripheral calcifications (2)  FNA of this nodule (05/08/2018): THYROID, FINE NEEDLE ASPIRATION, RLP (SPECIMEN 1 OF 1, COLLECTED 05/08/18): SUSPICIOUS FOR MALIGNANCY, SEE COMMENT (BETHESDA CATEGORY V).  She had right lobectomy + isthmusectomy (07/24/2018) by Dr. Gerrit Friends: Thyroid, lobectomy, right and isthmus - PAPILLARY CARCINOMA, 1.8 CM IN GREATEST DIMENSION. - NO EXTRATHYROIDAL EXTENSION IDENTIFIED. - MARGINS OF RESECTION ARE NOT INVOLVED. - ONE LYMPH NODE, NEGATIVE FOR CARCINOMA (0/1). - SEE ONCOLOGY TABLE. Microscopic Comment THYROID GLAND: Procedure: Right lobectomy and isthmusectomy Tumor Focality: Unifocal Tumor Site: Right  lobe Tumor Size: Greatest dimension: 1.8 cm Histologic Type: Papillary carcinoma, classic (usual, conventional) Margins: Uninvolved by carcinoma Angioinvasion: Not identified Lymphatic Invasion: Not identified Extrathyroidal Extension: Not identified Regional Lymph Nodes: Number of Lymph Nodes Involved: 0 Nodal Levels Involved: Not applicable Size of Largest Metastatic Deposit: Not applicable Extranodal Extension (ENE): Not applicable Number of Lymph Nodes Examined: 1 Nodal Levels Examined: Incidental. The lymph node is present in the soft tissue surrounding the thyroid parenchyma. Pathologic Stage Classification (pTNM, AJCC 8th Edition): pT1b, pN0 Comment: Intradepartmental consultation was obtained (Dr. Berneice Heinrich). r Thyroid U/S (11/03/2019): Parenchymal Echotexture: Moderately heterogenous Isthmus: 0.2 cm Right lobe: Surgically absent Left lobe: 4.3 x 1.1 x 1.6 cm  ____________________________________________________________________   Surgical changes of right hemithyroidectomy. No residual or recurrent thyroid tissue or nodularity in the thyroid resection bed. The residual isthmus and left thyroid gland are heterogeneous in appearance. Small subcentimeter cysts and hypoechoic nodules noted incidentally on the left. No suspicious features. These nodules do not meet criteria for biopsy or further imaging evaluation.   IMPRESSION: 1. Surgical changes of prior right hemithyroidectomy without evidence of residual or recurrent thyroid tissue. 2. Heterogeneous residual thyroid isthmus and left thyroid gland containing small cysts and nodules which do not meet criteria for further evaluation.oss  and Lab Results  Component Value Date   THYROGLB 17.7 11/18/2018   Lab Results  Component Value Date   THGAB 1 11/18/2018  cal Information Postsurgical hypothyroidism  Pt is on levothyroxine 50 alternating with 75 mcg every other day (misunderstood instructions and is actually taking  less LT4 than recommended): - in am - fasting - at least 30-60 min from coffee and cream, breakfast - no Ca, Fe, MVI, PPIs - occasionally taking biotin 5000 mcg daily -  not in last 2 weeks. On Vitamin D and occasionally Tums.  Reviewed her TFTs -last set of labs was obtained on the above dose: Lab Results  Component Value Date   TSH 2.080 09/25/2022   TSH 1.27 03/20/2022   TSH 1.710 01/09/2022   TSH 2.200 11/10/2021   TSH 2.07 07/10/2021   TSH 1.45 10/25/2020   TSH 1.85 10/20/2019   TSH 1.73 04/30/2019   TSH 1.55 11/18/2018   TSH 2.026 11/19/2017   FREET4 1.16 03/20/2022   FREET4 1.56 01/09/2022   FREET4 1.54 11/10/2021   FREET4 1.17 10/25/2020   FREET4 1.20 11/18/2018  09/2018: TSH ~1 08/2018: TSH ~4  Pt denies: - feeling nodules in neck - hoarseness - dysphagia - choking  She has + FH of thyroid disorders in: mother, PGM. No FH of thyroid cancer. No h/o radiation tx to head or neck. No herbal supplements. No Biotin use. No recent steroids use.   She is on the Mirena IUD since 2020.  ROS: + See HPI  I reviewed pt's medications, allergies, PMH, social hx, family hx, and changes were documented in the history of present illness. Otherwise, unchanged from my initial visit note.  Past Medical History:  Diagnosis Date   Back pain    GERD (gastroesophageal reflux disease)    resolved    H/O blood clots    High cholesterol    Hypertension    Hypothyroidism    Joint pain    PONV (postoperative nausea and vomiting)    mild nausea responsive to meds    Pulmonary embolism (HCC) 10/2017   Thyroid carcinoma (HCC)    Vitamin D deficiency    Past Surgical History:  Procedure Laterality Date   broken wrist     KNEE SURGERY     arthroscopy    THYROIDECTOMY Right 07/24/2018   Procedure: RIGHT THYROID LOBECTOMY  AND ISTHMUS;  Surgeon: Darnell Level, MD;  Location: [facility];  Service: General;  Laterality: Right;   Social History   Socioeconomic History   Marital  status: Single    Spouse name: Not on file   Number of children: Not on file   Years of education: Not on file   Highest education level: Not on file  Occupational History   Not on file  Tobacco Use   Smoking status: Never   Smokeless tobacco: Never  Vaping Use   Vaping Use: Never used  Substance and Sexual Activity   Alcohol use: Yes    Comment: occasional   Drug use: Never   Sexual activity: Yes    Birth control/protection: I.U.D.    Comment: Mirena placed 11/04/18  Other Topics Concern   Not on file  Social History Narrative   Social history: She is single.  Social alcohol consumption.  Has steady female partner.  Non-smoker.       Family history: Parents and twin brothers in good health   Social Determinants of Health   Financial Resource Strain: Not on file  Food Insecurity: Not on file  Transportation Needs: Not on file  Physical Activity: Not on file  Stress: Not on file  Social Connections: Not on file  Intimate Partner Violence: Not on file   Current Outpatient Medications on File Prior to Visit  Medication Sig Dispense Refill   apixaban (ELIQUIS) 5 MG TABS tablet Take 1 tablet (5 mg total) by mouth 2 (two) times daily. 180 tablet 3   buPROPion (WELLBUTRIN XL) 300 MG 24 hr tablet Take 1 tablet (  300 mg total) by mouth every morning. 30 tablet 0   levonorgestrel (MIRENA) 20 MCG/24HR IUD 1 each by Intrauterine route once. Placed 11/14/18     levothyroxine (SYNTHROID) 50 MCG tablet Take by mouth 1.5 tablets alternating with 2 tablets every other day 90 tablet 3   metFORMIN (GLUCOPHAGE) 500 MG tablet Take 1 tablet (500 mg total) by mouth 2 (two) times daily with a meal. 180 tablet 0   metoprolol tartrate (LOPRESSOR) 25 MG tablet Take 0.5 tablets (12.5 mg total) by mouth 2 (two) times daily. 90 tablet 3   rosuvastatin (CRESTOR) 5 MG tablet Take 1 tablet (5 mg total) by mouth daily. 90 tablet 3   Vitamin D, Ergocalciferol, (DRISDOL) 1.25 MG (50000 UNIT) CAPS capsule Take 1  capsule (50,000 Units total) by mouth once a week. 4 capsule 0   No current facility-administered medications on file prior to visit.   No Known Allergies Family History  Problem Relation Age of Onset   Thyroid disease Mother    Heart disease Paternal Grandmother    Heart disease Paternal Grandfather    Breast cancer Maternal Aunt 50    PE: BP 108/68 (BP Location: Left Arm, Patient Position: Sitting, Cuff Size: Normal)   Pulse 73   Ht 5\' 7"  (1.702 m)   Wt 263 lb 6.4 oz (119.5 kg)   SpO2 99%   BMI 41.25 kg/m  Wt Readings from Last 3 Encounters:  11/13/22 263 lb 6.4 oz (119.5 kg)  10/30/22 262 lb (118.8 kg)  10/09/22 262 lb (118.8 kg)   Constitutional: overweight, in NAD Eyes: EOMI, no exophthalmos ENT: no thyroid masses palpated, thyroidectomy scar healed, no cervical lymphadenopathy Cardiovascular: RRR, No MRG Respiratory: CTA B Musculoskeletal: no deformities Skin: no rashes Neurological: no tremor with outstretched hands  ASSESSMENT: 1. Thyroid cancer - see HPI  2. Postsurgical Hypothyroidism  PLAN:  1. Thyroid cancer -papillary -Patient has stage I TNM thyroid cancer by pathology.  This is a slow-growing cancer, with good prognosis -Her tumor was larger than 1.5 cm, however, it did not have aggressive features so I did not recommend completion thyroidectomy + RAI treatment -We checked a thyroid ultrasound in 10/2019 and this did not show any concerning masses.  She had heterogeneous residual thyroid isthmus and left thyroid gland left in place, which appeared low risk and for which no further investigation was recommended. -Thyroglobulin levels are not checked anymore in the setting of hemithyroidectomy due to the fact that there are too fluctuating and unreliable -Will continue to follow currently today with neck ultrasound, as needed -At today's visit, she has not neck compression symptoms and no masses felt on her neck -we will check a thyroid ultrasound  now  2. Patient with history of partial thyroidectomy for thyroid cancer now with iatrogenic hypothyroidism - latest thyroid labs reviewed with pt. >> normal: Lab Results  Component Value Date   TSH 2.080 09/25/2022  - she continues on LT4 50 alternating with 75 mcg of the other day (using the 50 mcg tablets) but would like to change to taking 1 dose throughout if possible.   - pt feels good on this dose. - we discussed about taking the thyroid hormone every day, with water, >30 minutes before breakfast, separated by >4 hours from acid reflux medications, calcium, iron, multivitamins. Pt. is taking it correctly. - we did discuss at last visit about not missing levothyroxine dose.  Her anxiety is most likely not related to the thyroid medication.  We did  switch to tablets without dyes, of 50 mcg, and we did discuss about Tirosint (liquid levothyroxine) but this is expensive and may not be covered by her insurance.  At today's visit she is interested in switching to 75 mcg tablets throughout.  We will increase the dose now. - will check thyroid tests in 1.5 months - RTC in 1 year  Orders Placed This Encounter  Procedures   US THYROID   TSH   T4, free   Carlus Pavlov, MD PhD [facility] Endocrinology

## 2022-11-13 NOTE — Telephone Encounter (Signed)
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 Has had this for several days. She has contacted me with these symptoms by text. Could be plant dermatitis such as poison ivy. Sending in Medrol  4mg  dose pack with one refill.

## 2022-11-20 ENCOUNTER — Ambulatory Visit
Admission: RE | Admit: 2022-11-20 | Discharge: 2022-11-20 | Disposition: A | Discharge status: Home / Self Care | Payer: BC Managed Care – PPO | Source: Ambulatory Visit | Attending: Internal Medicine | Admitting: Internal Medicine

## 2022-11-20 DIAGNOSIS — E041 Nontoxic single thyroid nodule: Secondary | ICD-10-CM | POA: Diagnosis not present

## 2022-11-20 DIAGNOSIS — C73 Malignant neoplasm of thyroid gland: Secondary | ICD-10-CM

## 2022-11-27 ENCOUNTER — Encounter (INDEPENDENT_AMBULATORY_CARE_PROVIDER_SITE_OTHER): Payer: Self-pay | Admitting: Family Medicine

## 2022-11-27 ENCOUNTER — Ambulatory Visit (INDEPENDENT_AMBULATORY_CARE_PROVIDER_SITE_OTHER): Payer: BC Managed Care – PPO | Admitting: Family Medicine

## 2022-11-27 VITALS — BP 106/73 | HR 62 | Temp 98.2°F | Ht 67.0 in | Wt 255.0 lb

## 2022-11-27 DIAGNOSIS — Z6839 Body mass index (BMI) 39.0-39.9, adult: Secondary | ICD-10-CM

## 2022-11-27 DIAGNOSIS — E669 Obesity, unspecified: Secondary | ICD-10-CM

## 2022-11-27 DIAGNOSIS — F3289 Other specified depressive episodes: Secondary | ICD-10-CM | POA: Diagnosis not present

## 2022-11-27 DIAGNOSIS — E88819 Insulin resistance, unspecified: Secondary | ICD-10-CM | POA: Diagnosis not present

## 2022-11-27 DIAGNOSIS — Z6841 Body Mass Index (BMI) 40.0 and over, adult: Secondary | ICD-10-CM

## 2022-11-27 MED ORDER — BUPROPION HCL ER (XL) 300 MG PO TB24
300.00 mg | ORAL_TABLET | Freq: Every morning | ORAL | 0 refills | Status: DC
Start: 2022-11-27 — End: 2022-12-25

## 2022-11-27 NOTE — Progress Notes (Signed)
.[facility]  Office: [phone]  /  Fax: [phone]  WEIGHT SUMMARY AND BIOMETRICS  Anthropometric Measurements Height: 5\' 7"  (1.702 m) Weight: 255 lb (115.7 kg) BMI (Calculated): 39.93 Weight Lost Since Last Visit: 7 lb   Body Composition  Body Fat %: 49.2 % Fat Mass (lbs): 125.8 lbs Muscle Mass (lbs): 123.4 lbs Total Body Water (lbs): 92.8 lbs Visceral Fat Rating : 14   Other Clinical Data Fasting: no Labs: no Today's Visit #: 16   iscussed the use of AI scribe software for clinical note transcription with the patient, who gave verbal consent to proceed.  Chief Complaint: OBESITY  Penny Hancock is here to discuss her progress with her obesity treatment plan. She is on the following a lower carbohydrate, vegetable and lean protein rich diet plan and states she is following her eating plan approximately 80 % of the time. She states she is exercising 30 minutes 5 times per week.  DHistory of Present Illness   The patient, with a history of obesity, depression with emotional eating behaviors, and insulin resistance, has been following a category two eating plan and taking metformin. They report a significant weight loss of seven pounds over the past few weeks, attributing this success to dietary changes and increased physical activity, specifically walking around their neighborhood. They also mention a recent switch to Wellbutrin, though they are unsure of its impact on their progress.  Despite this positive trend, the patient has been experiencing nocturnal pruritus and hives, which they believe may be a reaction to poison ivy or poison oak exposure. They were treated with a Medrol Dosepak, which alleviated the systemic response but left some localized itching.  The patient also expressed potential interest in appetite suppressants if weight loss plateaus in the future. They have a trip to [place] planned in the near future, which may present dietary challenges.          PHYSICAL  EXAM:  Blood pressure 106/73, pulse 62, temperature 98.2 F (36.8 C), height 5\' 7"  (1.702 m), weight 255 lb (115.7 kg), SpO2 98 %. Body mass index is 39.94 kg/m.  DIAGNOSTIC DATA REVIEWED:  BMET    Component Value Date/Time   NA 138 09/25/2022 0738   K 4.5 09/25/2022 0738   CL 105 09/25/2022 0738   CO2 19 (L) 09/25/2022 0738   GLUCOSE 93 09/25/2022 0738   GLUCOSE 108 (H) 07/10/2021 0912   BUN 16 09/25/2022 0738   CREATININE 0.84 09/25/2022 0738   CREATININE 0.85 07/10/2021 0912   CALCIUM 8.7 09/25/2022 0738   GFRNONAA 86 04/30/2019 0904   GFRAA 99 04/30/2019 0904   Lab Results  Component Value Date   HGBA1C 5.4 09/25/2022   HGBA1C 5.1 04/30/2019   Lab Results  Component Value Date   INSULIN 7.8 09/25/2022   INSULIN 8.0 01/09/2022   Lab Results  Component Value Date   TSH 2.080 09/25/2022   CBC    Component Value Date/Time   WBC 5.8 07/10/2021 0912   RBC 4.59 07/10/2021 0912   HGB 14.9 07/10/2021 0912   HCT 44.0 07/10/2021 0912   PLT 246 07/10/2021 0912   MCV 95.9 07/10/2021 0912   MCH 32.5 07/10/2021 0912   MCHC 33.9 07/10/2021 0912   RDW 11.8 07/10/2021 0912   Iron Studies No results found for: "IRON", "TIBC", "FERRITIN", "IRONPCTSAT" Lipid Panel     Component Value Date/Time   CHOL 161 09/25/2022 0738   TRIG 75 09/25/2022 0738   HDL 50 09/25/2022 0738   CHOLHDL  2.6 12/26/2021 1158   LDLCALC 97 09/25/2022 0738   LDLCALC 72 12/26/2021 1158   Hepatic Function Panel     Component Value Date/Time   PROT 6.1 09/25/2022 0738   ALBUMIN 4.0 09/25/2022 0738   AST 13 09/25/2022 0738   ALT 12 09/25/2022 0738   ALKPHOS 73 09/25/2022 0738   BILITOT 0.5 09/25/2022 0738   BILIDIR 0.1 08/17/2019 0902   IBILI 0.5 08/17/2019 0902      Component Value Date/Time   TSH 2.080 09/25/2022 0738   Nutritional Lab Results  Component Value Date   VD25OH 51.3 09/25/2022   VD25OH 53.3 01/09/2022   VD25OH 59 07/10/2021     Assessment and Plan    Obesity:  Significant weight loss of 7 pounds over the past 4 weeks, with 5 pounds being fat and 2 pounds being water. The patient has been following a low carb diet and recently switched to a category two diet. She has also been walking regularly. -Continue current diet and exercise regimen. -Consider adding strengthening exercises focusing on large muscle groups to improve metabolism and insulin sensitivity.  Depression with emotional eating behaviors: The patient is currently on Wellbutrin. She reports no obvious changes in cravings or stress eating since starting the medication, but is willing to continue it for another month to assess its effectiveness. -Continue Wellbutrin. -Reevaluate in one month.  Insulin Resistance: The patient is currently on Metformin and following a category two diet. -Continue Metformin. -Continue category two diet. -Increase strengthening exercises.  Follow-up in 4 weeks.          She was informed of the importance of frequent follow up visits to maximize her success with intensive lifestyle modifications for her multiple health conditions. Return in about 4 weeks (around 12/25/2022).   Quillian Quince, MD

## 2022-11-28 ENCOUNTER — Other Ambulatory Visit (INDEPENDENT_AMBULATORY_CARE_PROVIDER_SITE_OTHER): Payer: Self-pay | Admitting: Family Medicine

## 2022-11-28 DIAGNOSIS — E88819 Insulin resistance, unspecified: Secondary | ICD-10-CM

## 2022-12-25 ENCOUNTER — Encounter (INDEPENDENT_AMBULATORY_CARE_PROVIDER_SITE_OTHER): Payer: Self-pay | Admitting: Family Medicine

## 2022-12-25 ENCOUNTER — Other Ambulatory Visit (INDEPENDENT_AMBULATORY_CARE_PROVIDER_SITE_OTHER): Payer: BC Managed Care – PPO

## 2022-12-25 ENCOUNTER — Ambulatory Visit (INDEPENDENT_AMBULATORY_CARE_PROVIDER_SITE_OTHER): Payer: BC Managed Care – PPO | Admitting: Family Medicine

## 2022-12-25 VITALS — BP 111/76 | HR 70 | Temp 97.8°F | Ht 67.0 in | Wt 256.0 lb

## 2022-12-25 DIAGNOSIS — E669 Obesity, unspecified: Secondary | ICD-10-CM | POA: Diagnosis not present

## 2022-12-25 DIAGNOSIS — F3289 Other specified depressive episodes: Secondary | ICD-10-CM

## 2022-12-25 DIAGNOSIS — E890 Postprocedural hypothyroidism: Secondary | ICD-10-CM

## 2022-12-25 DIAGNOSIS — Z6841 Body Mass Index (BMI) 40.0 and over, adult: Secondary | ICD-10-CM

## 2022-12-25 DIAGNOSIS — E88819 Insulin resistance, unspecified: Secondary | ICD-10-CM

## 2022-12-25 MED ORDER — BUPROPION HCL ER (XL) 300 MG PO TB24
300.0000 mg | ORAL_TABLET | Freq: Every morning | ORAL | 0 refills | Status: DC
Start: 2022-12-25 — End: 2023-02-20

## 2022-12-25 MED ORDER — METFORMIN HCL 500 MG PO TABS
500.0000 mg | ORAL_TABLET | Freq: Two times a day (BID) | ORAL | 0 refills | Status: DC
Start: 2022-12-25 — End: 2023-03-05

## 2022-12-25 NOTE — Progress Notes (Unsigned)
Chief Complaint:   OBESITY Penny Hancock is here to discuss her progress with her obesity treatment plan along with follow-up of her obesity related diagnoses. Penny Hancock is on the Category 2 Plan and states she is following her eating plan approximately 70-75% of the time. Penny Hancock states she is walking for 30 minutes 2 times per week.  Today's visit was #: 17 Starting weight: 300 lbs Starting date: 01/09/2022 Today's weight: 256 lbs Today's date: 12/25/2022 Total lbs lost to date: 44 Total lbs lost since last in-office visit: 0  Interim History: Patient did very well with minimizing vacation weight gain.  She is doing well with meeting her protein goals, but she is getting somewhat bored with her options.  Her sleep is good, and her hunger is mostly controlled.  Subjective:   1. Insulin resistance Patient is doing well on metformin.  She takes her second dose either at lunch or at supper.  She notes feeling tired after lunch at times.  2. Emotional Eating Behavior Patient is tolerating Wellbutrin, but she is not sure if it is helping.  She is thinking about stopping, but she is not sure.  Assessment/Plan:   1. Insulin resistance We will refill metformin 500 mg twice daily for 90 days.  Patient is to try to associate feeling tired with days she either takes or does not take her metformin.  - metFORMIN (GLUCOPHAGE) 500 MG tablet; Take 1 tablet (500 mg total) by mouth 2 (two) times daily with a meal.  Dispense: 180 tablet; Refill: 0  2. Emotional Eating Behavior We will refill Wellbutrin XL 300 mg once daily for 1 month.  Patient may want to taper off, and we will follow-up at her next visit in 1 month.  - buPROPion (WELLBUTRIN XL) 300 MG 24 hr tablet; Take 1 tablet (300 mg total) by mouth every morning.  Dispense: 30 tablet; Refill: 0  3. BMI 40.0-44.9, adult (HCC)  4. Obesity, Beginning BMI 46.99 Penny Hancock is currently in the action stage of change. As such, her goal is to continue with  weight loss efforts. She has agreed to the Category 2 Plan or the Pescatarian Plan.   Exercise goals: As is.   Behavioral modification strategies: increasing lean protein intake and no skipping meals.  Penny Hancock has agreed to follow-up with our clinic in 4 weeks. She was informed of the importance of frequent follow-up visits to maximize her success with intensive lifestyle modifications for her multiple health conditions.   Objective:   Blood pressure 111/76, pulse 70, temperature 97.8 F (36.6 C), height 5\' 7"  (1.702 m), weight 256 lb (116.1 kg), SpO2 98 %. Body mass index is 40.1 kg/m.  Lab Results  Component Value Date   CREATININE 0.84 09/25/2022   BUN 16 09/25/2022   NA 138 09/25/2022   K 4.5 09/25/2022   CL 105 09/25/2022   CO2 19 (L) 09/25/2022   Lab Results  Component Value Date   ALT 12 09/25/2022   AST 13 09/25/2022   ALKPHOS 73 09/25/2022   BILITOT 0.5 09/25/2022   Lab Results  Component Value Date   HGBA1C 5.4 09/25/2022   HGBA1C 5.1 07/10/2021   HGBA1C 5.0 08/17/2019   HGBA1C 5.1 04/30/2019   Lab Results  Component Value Date   INSULIN 7.8 09/25/2022   INSULIN 8.0 01/09/2022   Lab Results  Component Value Date   TSH 2.080 09/25/2022   Lab Results  Component Value Date   CHOL 161 09/25/2022   HDL  50 09/25/2022   LDLCALC 97 09/25/2022   TRIG 75 09/25/2022   CHOLHDL 2.6 12/26/2021   Lab Results  Component Value Date   VD25OH 51.3 09/25/2022   VD25OH 53.3 01/09/2022   VD25OH 59 07/10/2021   Lab Results  Component Value Date   WBC 5.8 07/10/2021   HGB 14.9 07/10/2021   HCT 44.0 07/10/2021   MCV 95.9 07/10/2021   PLT 246 07/10/2021   No results found for: "IRON", "TIBC", "FERRITIN"  Attestation Statements:   Reviewed by clinician on day of visit: allergies, medications, problem list, medical history, surgical history, family history, social history, and previous encounter notes.   I, Burt Knack, am acting as transcriptionist for  Quillian Quince, MD.  I have reviewed the above documentation for accuracy and completeness, and I agree with the above. -  Quillian Quince, MD

## 2022-12-26 LAB — T4, FREE: Free T4: 1.15 ng/dL (ref 0.60–1.60)

## 2022-12-26 LAB — TSH: TSH: 1.54 u[IU]/mL (ref 0.35–5.50)

## 2023-01-22 ENCOUNTER — Ambulatory Visit (INDEPENDENT_AMBULATORY_CARE_PROVIDER_SITE_OTHER): Payer: BC Managed Care – PPO | Admitting: Family Medicine

## 2023-01-22 ENCOUNTER — Encounter (INDEPENDENT_AMBULATORY_CARE_PROVIDER_SITE_OTHER): Payer: Self-pay | Admitting: Family Medicine

## 2023-01-22 VITALS — BP 109/75 | HR 67 | Temp 98.0°F | Ht 67.0 in | Wt 258.0 lb

## 2023-01-22 DIAGNOSIS — R7303 Prediabetes: Secondary | ICD-10-CM | POA: Diagnosis not present

## 2023-01-22 DIAGNOSIS — E669 Obesity, unspecified: Secondary | ICD-10-CM

## 2023-01-22 DIAGNOSIS — Z6841 Body Mass Index (BMI) 40.0 and over, adult: Secondary | ICD-10-CM | POA: Diagnosis not present

## 2023-01-22 DIAGNOSIS — F3289 Other specified depressive episodes: Secondary | ICD-10-CM | POA: Diagnosis not present

## 2023-01-22 MED ORDER — BUPROPION HCL ER (SR) 200 MG PO TB12
200.0000 mg | ORAL_TABLET | Freq: Every day | ORAL | 0 refills | Status: DC
Start: 2023-01-22 — End: 2023-02-20

## 2023-01-22 NOTE — Progress Notes (Unsigned)
Chief Complaint:   OBESITY Penny Hancock is here to discuss her progress with her obesity treatment plan along with follow-up of her obesity related diagnoses. Penny Hancock is on the Category 2 Plan or the Pescatarian Plan and states she is following her eating plan approximately 80% of the time. Penny Hancock states she is walking for 20 minutes 4 times per week.  Today's visit was #: 18 Starting weight: 300 lbs Starting date: 01/09/2022 Today's weight: 258 lbs Today's date: 01/22/2023 Total lbs lost to date: 42 Total lbs lost since last in-office visit: 0  Interim History: Patient is up 2 pounds in the last month.  She is struggling with increased afternoon and evening hunger, and may not always meet her protein goals.  Subjective:   1. Prediabetes Patient is doing well on metformin, and she is working on her diet.  2. Emotional Eating Behavior Patient stopped her Wellbutrin XL as it did not seem to be helping her.  She notes weight gain since stopping however and it may have helped more than she realized.  Assessment/Plan:   1. Prediabetes Patient will continue metformin and will continue with her diet, she will work on increasing strengthening for improved glucose control.  2. Emotional Eating Behavior Patient agreed to restart Wellbutrin SR 200 mg every morning with no refills.  We will follow-up at her next visit in 1 month.  - buPROPion (WELLBUTRIN SR) 200 MG 12 hr tablet; Take 1 tablet (200 mg total) by mouth daily.  Dispense: 30 tablet; Refill: 0  3. BMI 40.0-44.9, adult (HCC)  4. Obesity, Beginning BMI 46.99 Penny Hancock is currently in the action stage of change. As such, her goal is to continue with weight loss efforts. She has agreed to the Category 2 Plan.   ChatGPT options to keep meal more interesting were discussed and demonstrated.   Exercise goals: As is.   Behavioral modification strategies: increasing lean protein intake.  Penny Hancock has agreed to follow-up with our clinic in  4 weeks. She was informed of the importance of frequent follow-up visits to maximize her success with intensive lifestyle modifications for her multiple health conditions.   Objective:   Blood pressure 109/75, pulse 67, temperature 98 F (36.7 C), height 5\' 7"  (1.702 m), weight 258 lb (117 kg), SpO2 98%. Body mass index is 40.41 kg/m.  Lab Results  Component Value Date   CREATININE 0.84 09/25/2022   BUN 16 09/25/2022   NA 138 09/25/2022   K 4.5 09/25/2022   CL 105 09/25/2022   CO2 19 (L) 09/25/2022   Lab Results  Component Value Date   ALT 12 09/25/2022   AST 13 09/25/2022   ALKPHOS 73 09/25/2022   BILITOT 0.5 09/25/2022   Lab Results  Component Value Date   HGBA1C 5.4 09/25/2022   HGBA1C 5.1 07/10/2021   HGBA1C 5.0 08/17/2019   HGBA1C 5.1 04/30/2019   Lab Results  Component Value Date   INSULIN 7.8 09/25/2022   INSULIN 8.0 01/09/2022   Lab Results  Component Value Date   TSH 1.54 12/25/2022   Lab Results  Component Value Date   CHOL 161 09/25/2022   HDL 50 09/25/2022   LDLCALC 97 09/25/2022   TRIG 75 09/25/2022   CHOLHDL 2.6 12/26/2021   Lab Results  Component Value Date   VD25OH 51.3 09/25/2022   VD25OH 53.3 01/09/2022   VD25OH 59 07/10/2021   Lab Results  Component Value Date   WBC 5.8 07/10/2021   HGB 14.9 07/10/2021  HCT 44.0 07/10/2021   MCV 95.9 07/10/2021   PLT 246 07/10/2021   No results found for: "IRON", "TIBC", "FERRITIN"  Attestation Statements:   Reviewed by clinician on day of visit: allergies, medications, problem list, medical history, surgical history, family history, social history, and previous encounter notes.   I, Burt Knack, am acting as transcriptionist for Quillian Quince, MD.  I have reviewed the above documentation for accuracy and completeness, and I agree with the above. -  Quillian Quince, MD

## 2023-01-25 ENCOUNTER — Other Ambulatory Visit: Payer: Self-pay | Admitting: Oncology

## 2023-02-19 ENCOUNTER — Other Ambulatory Visit (INDEPENDENT_AMBULATORY_CARE_PROVIDER_SITE_OTHER): Payer: Self-pay | Admitting: Family Medicine

## 2023-02-19 ENCOUNTER — Encounter (INDEPENDENT_AMBULATORY_CARE_PROVIDER_SITE_OTHER): Payer: Self-pay | Admitting: Family Medicine

## 2023-02-19 DIAGNOSIS — F3289 Other specified depressive episodes: Secondary | ICD-10-CM

## 2023-02-20 MED ORDER — BUPROPION HCL ER (SR) 200 MG PO TB12: 200.0000 mg | ORAL_TABLET | Freq: Every day | ORAL | 0 refills | Status: DC

## 2023-03-05 ENCOUNTER — Encounter (INDEPENDENT_AMBULATORY_CARE_PROVIDER_SITE_OTHER): Payer: Self-pay | Admitting: Family Medicine

## 2023-03-05 ENCOUNTER — Ambulatory Visit (INDEPENDENT_AMBULATORY_CARE_PROVIDER_SITE_OTHER): Payer: BC Managed Care – PPO | Admitting: Family Medicine

## 2023-03-05 VITALS — BP 138/81 | HR 62 | Temp 98.2°F | Ht 67.0 in | Wt 257.0 lb

## 2023-03-05 DIAGNOSIS — F3289 Other specified depressive episodes: Secondary | ICD-10-CM

## 2023-03-05 DIAGNOSIS — F5089 Other specified eating disorder: Secondary | ICD-10-CM

## 2023-03-05 DIAGNOSIS — E559 Vitamin D deficiency, unspecified: Secondary | ICD-10-CM

## 2023-03-05 DIAGNOSIS — E669 Obesity, unspecified: Secondary | ICD-10-CM

## 2023-03-05 DIAGNOSIS — E88819 Insulin resistance, unspecified: Secondary | ICD-10-CM | POA: Diagnosis not present

## 2023-03-05 DIAGNOSIS — Z6841 Body Mass Index (BMI) 40.0 and over, adult: Secondary | ICD-10-CM

## 2023-03-05 MED ORDER — VITAMIN D (ERGOCALCIFEROL) 1.25 MG (50000 UNIT) PO CAPS: 50000.0000 [IU] | ORAL_CAPSULE | ORAL | 0 refills | Status: DC

## 2023-03-05 MED ORDER — METFORMIN HCL 500 MG PO TABS: 500.0000 mg | ORAL_TABLET | Freq: Two times a day (BID) | ORAL | 0 refills | Status: DC

## 2023-03-05 MED ORDER — BUPROPION HCL ER (SR) 200 MG PO TB12: 200.0000 mg | ORAL_TABLET | Freq: Two times a day (BID) | ORAL | 0 refills | Status: DC

## 2023-03-06 MED ORDER — BUPROPION HCL ER (SR) 150 MG PO TB12
150.0000 mg | ORAL_TABLET | Freq: Two times a day (BID) | ORAL | 0 refills | Status: DC
Start: 2023-03-06 — End: 2023-04-03

## 2023-03-06 NOTE — Progress Notes (Unsigned)
Chief Complaint:   OBESITY Penny Hancock is here to discuss her progress with her obesity treatment plan along with follow-up of her obesity related diagnoses. Penny Hancock is on the Category 2 Plan and states she is following her eating plan approximately 80% of the time. Penny Hancock states she is walking for 20 minutes 1-2 times per week.  Today's visit was #: 19 Starting weight: 300 lbs Starting date: 01/09/2022 Today's weight: 257 lbs Today's date: 03/05/2023 Total lbs lost to date: 43 Total lbs lost since last in-office visit: 1  Interim History: Patient continues to do well with her weight loss.  Her hunger is mostly controlled and she is doing well with meal prepping and meeting her protein goals.  Subjective:   1. Vitamin D deficiency Patient is on vitamin D prescription, and she is due to have labs done soon to monitor her progress.  2. Insulin resistance Patient is doing well on metformin and with her diet.  She is due to have labs done soon.  3. Emotional Eating Behavior Patient notes an increase in fatigue and she struggles to exercise due to this.  She has no problems on Wellbutrin, and her blood pressure is stable.  Assessment/Plan:   1. Vitamin D deficiency Patient will continue prescription vitamin D once weekly, and we will refill for 1 month.  - Vitamin D, Ergocalciferol, (DRISDOL) 1.25 MG (50000 UNIT) CAPS capsule; Take 1 capsule (50,000 Units total) by mouth once a week.  Dispense: 4 capsule; Refill: 0  2. Insulin resistance Patient will continue metformin 500 mg twice daily, and we will refill for 90 days.  - metFORMIN (GLUCOPHAGE) 500 MG tablet; Take 1 tablet (500 mg total) by mouth 2 (two) times daily with a meal.  Dispense: 180 tablet; Refill: 0  3. Emotional Eating Behavior Patient agreed to change to Wellbutrin SR 150 mg twice daily, and we will refill for 1 month.   - buPROPion (WELLBUTRIN SR) 150 MG 12 hr tablet; Take 1 tablet (150 mg total) by mouth 2 (two)  times daily.  Dispense: 60 tablet; Refill: 0  4. BMI 40.0-44.9, adult (HCC)  5. Obesity, Beginning BMI 46.99 Penny Hancock is currently in the action stage of change. As such, her goal is to continue with weight loss efforts. She has agreed to the Category 2 Plan or keeping a food journal and adhering to recommended goals of 1200-1300 calories and 80+ grams of protein daily.   Exercise goals: As is.   Behavioral modification strategies: increasing lean protein intake.  Penny Hancock has agreed to follow-up with our clinic in 4 weeks. She was informed of the importance of frequent follow-up visits to maximize her success with intensive lifestyle modifications for her multiple health conditions.   Objective:   Blood pressure 138/81, pulse 62, temperature 98.2 F (36.8 C), height 5\' 7"  (1.702 m), weight 257 lb (116.6 kg), SpO2 97%. Body mass index is 40.25 kg/m.  Lab Results  Component Value Date   CREATININE 0.84 09/25/2022   BUN 16 09/25/2022   NA 138 09/25/2022   K 4.5 09/25/2022   CL 105 09/25/2022   CO2 19 (L) 09/25/2022   Lab Results  Component Value Date   ALT 12 09/25/2022   AST 13 09/25/2022   ALKPHOS 73 09/25/2022   BILITOT 0.5 09/25/2022   Lab Results  Component Value Date   HGBA1C 5.4 09/25/2022   HGBA1C 5.1 07/10/2021   HGBA1C 5.0 08/17/2019   HGBA1C 5.1 04/30/2019   Lab Results  Component Value Date   INSULIN 7.8 09/25/2022   INSULIN 8.0 01/09/2022   Lab Results  Component Value Date   TSH 1.54 12/25/2022   Lab Results  Component Value Date   CHOL 161 09/25/2022   HDL 50 09/25/2022   LDLCALC 97 09/25/2022   TRIG 75 09/25/2022   CHOLHDL 2.6 12/26/2021   Lab Results  Component Value Date   VD25OH 51.3 09/25/2022   VD25OH 53.3 01/09/2022   VD25OH 59 07/10/2021   Lab Results  Component Value Date   WBC 5.8 07/10/2021   HGB 14.9 07/10/2021   HCT 44.0 07/10/2021   MCV 95.9 07/10/2021   PLT 246 07/10/2021   No results found for: "IRON", "TIBC",  "FERRITIN"  Attestation Statements:   Reviewed by clinician on day of visit: allergies, medications, problem list, medical history, surgical history, family history, social history, and previous encounter notes.   I, Burt Knack, am acting as transcriptionist for Quillian Quince, MD.  I have reviewed the above documentation for accuracy and completeness, and I agree with the above. -  Quillian Quince, MD

## 2023-03-18 ENCOUNTER — Other Ambulatory Visit: Payer: Self-pay | Admitting: Internal Medicine

## 2023-03-18 DIAGNOSIS — I10 Essential (primary) hypertension: Secondary | ICD-10-CM

## 2023-03-26 DIAGNOSIS — E559 Vitamin D deficiency, unspecified: Secondary | ICD-10-CM | POA: Diagnosis not present

## 2023-03-26 DIAGNOSIS — E88819 Insulin resistance, unspecified: Secondary | ICD-10-CM | POA: Diagnosis not present

## 2023-03-26 NOTE — Addendum Note (Signed)
Addended by: Quillian Quince D on: 03/26/2023 07:31 AM   Modules accepted: Orders

## 2023-03-27 LAB — LIPID PANEL WITH LDL/HDL RATIO
Cholesterol, Total: 145 mg/dL (ref 100–199)
HDL: 52 mg/dL (ref >39)
LDL Chol Calc (NIH): 78 mg/dL (ref 0–99)
LDL/HDL Ratio: 1.5 {ratio} (ref 0.0–3.2)
Triglycerides: 79 mg/dL (ref 0–149)
VLDL Cholesterol Cal: 15 mg/dL (ref 5–40)

## 2023-03-27 LAB — VITAMIN B12: Vitamin B-12: 1048 pg/mL (ref 232–1245)

## 2023-03-27 LAB — CMP14+EGFR
ALT: 11 [IU]/L (ref 0–32)
AST: 14 [IU]/L (ref 0–40)
Albumin: 4 g/dL (ref 3.9–4.9)
Alkaline Phosphatase: 64 [IU]/L (ref 44–121)
BUN/Creatinine Ratio: 21 (ref 9–23)
BUN: 19 mg/dL (ref 6–24)
Bilirubin Total: 0.5 mg/dL (ref 0.0–1.2)
CO2: 17 mmol/L — ABNORMAL LOW (ref 20–29)
Calcium: 8.5 mg/dL — ABNORMAL LOW (ref 8.7–10.2)
Chloride: 107 mmol/L — ABNORMAL HIGH (ref 96–106)
Creatinine, Ser: 0.9 mg/dL (ref 0.57–1.00)
Globulin, Total: 2.2 g/dL (ref 1.5–4.5)
Glucose: 88 mg/dL (ref 70–99)
Potassium: 4.4 mmol/L (ref 3.5–5.2)
Sodium: 140 mmol/L (ref 134–144)
Total Protein: 6.2 g/dL (ref 6.0–8.5)
eGFR: 81 mL/min/{1.73_m2} (ref >59)

## 2023-03-27 LAB — VITAMIN D 25 HYDROXY (VIT D DEFICIENCY, FRACTURES): Vit D, 25-Hydroxy: 66.3 ng/mL (ref 30.0–100.0)

## 2023-03-27 LAB — CBC WITH DIFFERENTIAL/PLATELET
Basophils Absolute: 0 10*3/uL (ref 0.0–0.2)
Basos: 1 %
EOS (ABSOLUTE): 0.1 10*3/uL (ref 0.0–0.4)
Eos: 2 %
Hematocrit: 44.6 % (ref 34.0–46.6)
Hemoglobin: 14.7 g/dL (ref 11.1–15.9)
Immature Grans (Abs): 0 10*3/uL (ref 0.0–0.1)
Immature Granulocytes: 0 %
Lymphocytes Absolute: 1.5 10*3/uL (ref 0.7–3.1)
Lymphs: 28 %
MCH: 32.7 pg (ref 26.6–33.0)
MCHC: 33 g/dL (ref 31.5–35.7)
MCV: 99 fL — ABNORMAL HIGH (ref 79–97)
Monocytes Absolute: 0.4 10*3/uL (ref 0.1–0.9)
Monocytes: 7 %
Neutrophils Absolute: 3.3 10*3/uL (ref 1.4–7.0)
Neutrophils: 62 %
Platelets: 207 10*3/uL (ref 150–450)
RBC: 4.49 x10E6/uL (ref 3.77–5.28)
RDW: 11.6 % — ABNORMAL LOW (ref 11.7–15.4)
WBC: 5.4 10*3/uL (ref 3.4–10.8)

## 2023-03-27 LAB — FOLATE: Folate: 3.8 ng/mL (ref >3.0)

## 2023-03-27 LAB — HEMOGLOBIN A1C
Est. average glucose Bld gHb Est-mCnc: 100 mg/dL
Hgb A1c MFr Bld: 5.1 % (ref 4.8–5.6)

## 2023-03-27 LAB — TSH: TSH: 2.7 u[IU]/mL (ref 0.450–4.500)

## 2023-03-27 LAB — CK: Total CK: 59 U/L (ref 32–182)

## 2023-03-27 LAB — INSULIN, RANDOM: INSULIN: 6.8 u[IU]/mL (ref 2.6–24.9)

## 2023-04-02 ENCOUNTER — Ambulatory Visit (INDEPENDENT_AMBULATORY_CARE_PROVIDER_SITE_OTHER): Payer: BC Managed Care – PPO | Admitting: Family Medicine

## 2023-04-02 DIAGNOSIS — E559 Vitamin D deficiency, unspecified: Secondary | ICD-10-CM

## 2023-04-02 DIAGNOSIS — E7800 Pure hypercholesterolemia, unspecified: Secondary | ICD-10-CM

## 2023-04-02 DIAGNOSIS — E038 Other specified hypothyroidism: Secondary | ICD-10-CM

## 2023-04-02 DIAGNOSIS — E88819 Insulin resistance, unspecified: Secondary | ICD-10-CM

## 2023-04-03 ENCOUNTER — Encounter (INDEPENDENT_AMBULATORY_CARE_PROVIDER_SITE_OTHER): Payer: Self-pay | Admitting: Family Medicine

## 2023-04-03 ENCOUNTER — Ambulatory Visit (INDEPENDENT_AMBULATORY_CARE_PROVIDER_SITE_OTHER): Payer: BC Managed Care – PPO | Admitting: Family Medicine

## 2023-04-03 VITALS — BP 110/75 | HR 63 | Temp 98.1°F | Ht 67.0 in | Wt 254.0 lb

## 2023-04-03 DIAGNOSIS — F5089 Other specified eating disorder: Secondary | ICD-10-CM

## 2023-04-03 DIAGNOSIS — E559 Vitamin D deficiency, unspecified: Secondary | ICD-10-CM

## 2023-04-03 DIAGNOSIS — E669 Obesity, unspecified: Secondary | ICD-10-CM | POA: Diagnosis not present

## 2023-04-03 DIAGNOSIS — Z6839 Body mass index (BMI) 39.0-39.9, adult: Secondary | ICD-10-CM

## 2023-04-03 DIAGNOSIS — F3289 Other specified depressive episodes: Secondary | ICD-10-CM

## 2023-04-03 MED ORDER — BUPROPION HCL ER (SR) 150 MG PO TB12: 150.0000 mg | ORAL_TABLET | Freq: Two times a day (BID) | ORAL | 0 refills | Status: DC

## 2023-04-03 MED ORDER — VITAMIN D (ERGOCALCIFEROL) 1.25 MG (50000 UNIT) PO CAPS: 50000.0000 [IU] | ORAL_CAPSULE | ORAL | 0 refills | Status: DC

## 2023-04-03 NOTE — Progress Notes (Signed)
Chief Complaint:   OBESITY Penny Hancock is here to discuss her progress with her obesity treatment plan along with follow-up of her obesity related diagnoses. Penny Hancock is on the Category 2 Plan or keeping a food journal and adhering to recommended goals of 1200-1300 calories and 80+ grams of protein and states she is following her eating plan approximately 90% of the time. Penny Hancock states she is walking for 30 minutes 3-4 times per week.  Today's visit was #: 20 Starting weight: 300 lbs Starting date: 01/09/2022 Today's weight: 254 lbs Today's date: 04/03/2023 Total lbs lost to date: 46 Total lbs lost since last in-office visit: 3  Interim History: Patient continues to do well with her weight loss.  Her hunger is better controlled and she is increasing her activity.  She has stress at work, but it is still manageable.  Subjective:   1. Vitamin D deficiency Patient's vitamin D level is at goal.  She denies nausea, vomiting, or muscle weakness, and she requests a refill today.  2. Emotional Eating Behavior Patient increased her dose of Wellbutrin to twice daily and she is doing well with decreasing emotional eating behavior.  She sometimes misses her second dose but she is working on this.  Assessment/Plan:   1. Vitamin D deficiency Patient will continue her once weekly prescription vitamin D, and we will refill for 90 days.  - Vitamin D, Ergocalciferol, (DRISDOL) 1.25 MG (50000 UNIT) CAPS capsule; Take 1 capsule (50,000 Units total) by mouth once a week.  Dispense: 13 capsule; Refill: 0  2. Emotional Eating Behavior Patient will continue Wellbutrin SR twice daily, and we will refill for 90 days.   - buPROPion (WELLBUTRIN SR) 150 MG 12 hr tablet; Take 1 tablet (150 mg total) by mouth 2 (two) times daily.  Dispense: 180 tablet; Refill: 0  3. BMI 39.0-39.9,adult  4. Obesity, Beginning BMI 46.99 Penny Hancock is currently in the action stage of change. As such, her goal is to continue with  weight loss efforts. She has agreed to the Category 4 Plan.   Exercise goals: As is.   Behavioral modification strategies: increasing lean protein intake and no skipping meals.  Penny Hancock has agreed to follow-up with our clinic in 4 weeks. She was informed of the importance of frequent follow-up visits to maximize her success with intensive lifestyle modifications for her multiple health conditions.   Objective:   Blood pressure 110/75, pulse 63, temperature 98.1 F (36.7 C), height 5\' 7"  (1.702 m), weight 254 lb (115.2 kg), SpO2 98%. Body mass index is 39.78 kg/m.  Lab Results  Component Value Date   CREATININE 0.90 03/26/2023   BUN 19 03/26/2023   NA 140 03/26/2023   K 4.4 03/26/2023   CL 107 (H) 03/26/2023   CO2 17 (L) 03/26/2023   Lab Results  Component Value Date   ALT 11 03/26/2023   AST 14 03/26/2023   ALKPHOS 64 03/26/2023   BILITOT 0.5 03/26/2023   Lab Results  Component Value Date   HGBA1C 5.1 03/26/2023   HGBA1C 5.4 09/25/2022   HGBA1C 5.1 07/10/2021   HGBA1C 5.0 08/17/2019   HGBA1C 5.1 04/30/2019   Lab Results  Component Value Date   INSULIN 6.8 03/26/2023   INSULIN 7.8 09/25/2022   INSULIN 8.0 01/09/2022   Lab Results  Component Value Date   TSH 2.700 03/26/2023   Lab Results  Component Value Date   CHOL 145 03/26/2023   HDL 52 03/26/2023   LDLCALC 78 03/26/2023  TRIG 79 03/26/2023   CHOLHDL 2.6 12/26/2021   Lab Results  Component Value Date   VD25OH 66.3 03/26/2023   VD25OH 51.3 09/25/2022   VD25OH 53.3 01/09/2022   Lab Results  Component Value Date   WBC 5.4 03/26/2023   HGB 14.7 03/26/2023   HCT 44.6 03/26/2023   MCV 99 (H) 03/26/2023   PLT 207 03/26/2023   No results found for: "IRON", "TIBC", "FERRITIN"  Attestation Statements:   Reviewed by clinician on day of visit: allergies, medications, problem list, medical history, surgical history, family history, social history, and previous encounter notes.   I, Burt Knack, am  acting as transcriptionist for Quillian Quince, MD.  I have reviewed the above documentation for accuracy and completeness, and I agree with the above. -  Quillian Quince, MD

## 2023-04-04 ENCOUNTER — Ambulatory Visit: Payer: BC Managed Care – PPO | Admitting: Oncology

## 2023-04-04 ENCOUNTER — Inpatient Hospital Stay: Payer: BC Managed Care – PPO | Attending: Oncology | Admitting: Oncology

## 2023-04-04 VITALS — BP 118/79 | HR 78 | Temp 98.2°F | Resp 18 | Ht 67.0 in | Wt 261.2 lb

## 2023-04-04 DIAGNOSIS — Z86711 Personal history of pulmonary embolism: Secondary | ICD-10-CM | POA: Insufficient documentation

## 2023-04-04 DIAGNOSIS — E890 Postprocedural hypothyroidism: Secondary | ICD-10-CM | POA: Diagnosis not present

## 2023-04-04 DIAGNOSIS — I2699 Other pulmonary embolism without acute cor pulmonale: Secondary | ICD-10-CM

## 2023-04-04 DIAGNOSIS — Z85820 Personal history of malignant melanoma of skin: Secondary | ICD-10-CM | POA: Insufficient documentation

## 2023-04-04 DIAGNOSIS — Z7901 Long term (current) use of anticoagulants: Secondary | ICD-10-CM | POA: Insufficient documentation

## 2023-04-04 MED ORDER — APIXABAN 5 MG PO TABS: 5.0000 mg | ORAL_TABLET | Freq: Two times a day (BID) | ORAL | 3 refills | Status: DC

## 2023-04-04 NOTE — Progress Notes (Signed)
  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  She feels well.  She reports intentional weight loss.  No dyspnea or pleuritic chest pain.  She continues apixaban anticoagulation.  No bleeding.  Objective:  Vital signs in last 24 hours:  Blood pressure 118/79, pulse 78, temperature 98.2 F (36.8 C), temperature source Temporal, resp. rate 18, height 5\' 7"  (1.702 m), weight 261 lb 3.2 oz (118.5 kg), SpO2 100%. Cardio: Regular rate and rhythm GI: No hepatosplenomegaly Vascular: No leg edema  Lab Results:  Lab Results  Component Value Date   WBC 5.4 03/26/2023   HGB 14.7 03/26/2023   HCT 44.6 03/26/2023   MCV 99 (H) 03/26/2023   PLT 207 03/26/2023   NEUTROABS 3.3 03/26/2023    CMP  Lab Results  Component Value Date   NA 140 03/26/2023   K 4.4 03/26/2023   CL 107 (H) 03/26/2023   CO2 17 (L) 03/26/2023   GLUCOSE 88 03/26/2023   BUN 19 03/26/2023   CREATININE 0.90 03/26/2023   CALCIUM 8.5 (L) 03/26/2023   PROT 6.2 03/26/2023   ALBUMIN 4.0 03/26/2023   AST 14 03/26/2023   ALT 11 03/26/2023   ALKPHOS 64 03/26/2023   BILITOT 0.5 03/26/2023   GFRNONAA 86 04/30/2019   GFRAA 99 04/30/2019    No results found for: "CEA1", "CEA", "CAN199", "CA125"  Lab Results  Component Value Date   INR 1.00 07/24/2018   LABPROT 13.1 07/24/2018    Imaging:  No results found.  Medications: I have reviewed the patient's current medications.   Assessment/Plan:  Acute bilateral pulmonary emboli November 18, 2017-maintained on indefinite apixaban anticoagulation Negative hypercoagulation panel 11/18/2017 Elevated D-dimer April 2020 while off of apixaban for 1 month 2.  Papillary thyroid cancer, status post a right thyroidectomy 07/24/2018, 1.8 cm, no extrathyroidal extension, negative resection margins, 1 - lymph node 3.  Postsurgical hypothyroidism 4.  COVID-19 infection February 2021 5.  Zoster  April 2021-eye involvement 6.  History of menorrhagia-maintained on Mirena IUD 7.  Hyperlipidemia 8.  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  She is tolerating the apixaban well.  She reports resolution of dyspnea and pleuritic chest pain since beginning anticoagulation therapy.  I recommend indefinite anticoagulation therapy.  We discussed the indication for reduced intensity anticoagulation.  She has lost weight over the past few years.  She plans to continue weight loss.  We will consider changing to reduced intensity apixaban if she loses more weight.  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  Thornton Papas, MD  04/04/2023  9:29 AM

## 2023-04-11 ENCOUNTER — Ambulatory Visit: Payer: BC Managed Care – PPO | Admitting: Internal Medicine

## 2023-04-11 VITALS — BP 130/70

## 2023-04-11 DIAGNOSIS — J029 Acute pharyngitis, unspecified: Secondary | ICD-10-CM

## 2023-04-11 MED ORDER — CEFTRIAXONE SODIUM 1 G IJ SOLR
1.0000 g | Freq: Once | INTRAMUSCULAR | Status: AC
Start: 2023-04-11 — End: 2023-04-11
  Administered 2023-04-11: 1 g via INTRAMUSCULAR

## 2023-04-11 MED ORDER — FLUCONAZOLE 150 MG PO TABS: 150.0000 mg | ORAL_TABLET | Freq: Once | ORAL | 0 refills | Status: AC

## 2023-04-11 MED ORDER — AZITHROMYCIN 250 MG PO TABS: ORAL_TABLET | ORAL | 0 refills | Status: AC

## 2023-04-11 NOTE — Progress Notes (Addendum)
Follow-up   Patient Care Team: Margaree Mackintosh, MD as PCP - General (Internal Medicine)  Visit Date: 04/11/23  Subjective:    Patient ID: Penny Gamma, MD , Female   DOB: 09-06-1980, 42 y.o.    MRN: [id number]   41 y.o. Female presents today for sore throat that started recently after attending a carnival event [place] a local [place] where there were many children. She noticed a right tonsil exudate this morning. History of mononucleosis infection.  Past Medical History:  Diagnosis Date   Back pain    GERD (gastroesophageal reflux disease)    resolved    H/O blood clots    High cholesterol    Hypertension    Hypothyroidism    Joint pain    PONV (postoperative nausea and vomiting)    mild nausea responsive to meds    Pulmonary embolism (HCC) 10/2017   Thyroid carcinoma (HCC)    Vitamin D deficiency      Family History  Problem Relation Age of Onset   Thyroid disease Mother    Heart disease Paternal Grandmother    Heart disease Paternal Grandfather    Breast cancer Maternal Aunt 61    Social Hx: Single. 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      Review of Systems  Constitutional:  Negative for fever and malaise/fatigue.  HENT:  Positive for sore throat. Negative for congestion.   Eyes:  Negative for blurred vision.  Respiratory:  Negative for cough and shortness of breath.   Cardiovascular:  Negative for chest pain, palpitations and leg swelling.  Gastrointestinal:  Negative for vomiting.  Musculoskeletal:  Negative for back pain.  Skin:  Negative for rash.  Neurological:  Negative for loss of consciousness and headaches.        Objective:   Vitals: There were no vitals taken for this visit.   Physical Exam Vitals and nursing note reviewed.  Constitutional:      General: She is not in acute distress.    Appearance: Normal appearance. She is not toxic-appearing.  HENT:     Head:  Normocephalic and atraumatic.     Ears:     Comments: Left TM full, no erythema. Right TM fuller, no erythema.    Mouth/Throat:     Comments: Pharynx injected with exudate right tonsil. Pulmonary:     Effort: Pulmonary effort is normal.  Skin:    General: Skin is warm and dry.  Neurological:     Mental Status: She is alert and oriented to person, place, and time. Mental status is at baseline.  Psychiatric:        Mood and Affect: Mood normal.        Behavior: Behavior normal.        Thought Content: Thought content normal.        Judgment: Judgment normal.      Results:   Studies obtained and personally reviewed by me:   Labs:       Component Value Date/Time   NA 140 03/26/2023 0734   K 4.4 03/26/2023 0734   CL 107 (H) 03/26/2023 0734   CO2 17 (L) 03/26/2023 0734   GLUCOSE 88 03/26/2023 0734   GLUCOSE 108 (H) 07/10/2021 0912   BUN 19 03/26/2023 0734   CREATININE 0.90 03/26/2023 0734   CREATININE 0.85 07/10/2021 0912   CALCIUM 8.5 (L) 03/26/2023 0734   PROT 6.2 03/26/2023 0734   ALBUMIN 4.0 03/26/2023 0734  AST 14 03/26/2023 0734   ALT 11 03/26/2023 0734   ALKPHOS 64 03/26/2023 0734   BILITOT 0.5 03/26/2023 0734   GFRNONAA 86 04/30/2019 0904   GFRAA 99 04/30/2019 0904     Lab Results  Component Value Date   WBC 5.4 03/26/2023   HGB 14.7 03/26/2023   HCT 44.6 03/26/2023   MCV 99 (H) 03/26/2023   PLT 207 03/26/2023    Lab Results  Component Value Date   CHOL 145 03/26/2023   HDL 52 03/26/2023   LDLCALC 78 03/26/2023   TRIG 79 03/26/2023   CHOLHDL 2.6 12/26/2021    Lab Results  Component Value Date   HGBA1C 5.1 03/26/2023     Lab Results  Component Value Date   TSH 2.700 03/26/2023      Assessment & Plan:   Exudative pharyngitis / Bilateral serous otitis media:Rapid  strep screen is  negative. Prescribed Z-Pak two tabs day 1 followed by one tab days 2-5, Diflucan 150 mg once if needed for Candida vaginitis. Administered Rocephin 1 g IM. Get  plenty of rest and stay well-hydrated. Contact us if symptoms worsen or do not improve in 3-4 days.    I,Alexander Ruley,acting as a Neurosurgeon for Margaree Mackintosh, MD.,have documented all relevant documentation on the behalf of Margaree Mackintosh, MD,as directed by  Margaree Mackintosh, MD while in the presence of Margaree Mackintosh, MD.   I, Margaree Mackintosh, MD, have reviewed all documentation for this visit. The documentation on 04/12/23 for the exam, diagnosis, procedures, and orders are all accurate and complete.

## 2023-04-12 ENCOUNTER — Encounter: Payer: Self-pay | Admitting: Internal Medicine

## 2023-04-12 NOTE — Patient Instructions (Addendum)
Your rapid strep screen here in the office was negative.  I have prescribed Zithromax Z-PAK 2 tabs day 1 followed by 1 tab days 2 through 5.  Administered 1 g IM Rocephin in office.  Stay well-hydrated and get plenty of rest.  Call us if not improving in 3 to 4 days or sooner if worse.  As always, it was a pleasure to see you today and we are sorry you are not feeling well.

## 2023-05-07 ENCOUNTER — Encounter (INDEPENDENT_AMBULATORY_CARE_PROVIDER_SITE_OTHER): Payer: Self-pay | Admitting: Family Medicine

## 2023-05-07 ENCOUNTER — Ambulatory Visit (INDEPENDENT_AMBULATORY_CARE_PROVIDER_SITE_OTHER): Payer: BC Managed Care – PPO | Admitting: Family Medicine

## 2023-05-07 VITALS — BP 102/72 | HR 66 | Temp 98.2°F | Ht 67.0 in | Wt 253.0 lb

## 2023-05-07 DIAGNOSIS — E669 Obesity, unspecified: Secondary | ICD-10-CM | POA: Diagnosis not present

## 2023-05-07 DIAGNOSIS — R7303 Prediabetes: Secondary | ICD-10-CM | POA: Diagnosis not present

## 2023-05-07 DIAGNOSIS — Z6837 Body mass index (BMI) 37.0-37.9, adult: Secondary | ICD-10-CM | POA: Insufficient documentation

## 2023-05-07 DIAGNOSIS — E7800 Pure hypercholesterolemia, unspecified: Secondary | ICD-10-CM

## 2023-05-07 DIAGNOSIS — F5089 Other specified eating disorder: Secondary | ICD-10-CM

## 2023-05-07 DIAGNOSIS — E785 Hyperlipidemia, unspecified: Secondary | ICD-10-CM | POA: Diagnosis not present

## 2023-05-07 DIAGNOSIS — F3289 Other specified depressive episodes: Secondary | ICD-10-CM

## 2023-05-07 DIAGNOSIS — Z6839 Body mass index (BMI) 39.0-39.9, adult: Secondary | ICD-10-CM | POA: Insufficient documentation

## 2023-05-07 MED ORDER — ROSUVASTATIN CALCIUM 5 MG PO TABS: 5.0000 mg | ORAL_TABLET | Freq: Every day | ORAL | 0 refills | Status: DC

## 2023-05-07 NOTE — Progress Notes (Signed)
.smr  Office: [phone]  /  Fax: [phone]  WEIGHT SUMMARY AND BIOMETRICS  Anthropometric Measurements Height: 5\' 7"  (1.702 m) Weight: 253 lb (114.8 kg) BMI (Calculated): 39.62 Weight at Last Visit: 254 lb Weight Lost Since Last Visit: 1 lb Weight Gained Since Last Visit: 0 Starting Weight: 300 lb Total Weight Loss (lbs): 4 lb (1.814 kg)   Body Composition  Body Fat %: 48.7 % Fat Mass (lbs): 123.6 lbs Muscle Mass (lbs): 123.6 lbs Total Body Water (lbs): 93 lbs Visceral Fat Rating : 13   Other Clinical Data Fasting: no Labs: no Today's Visit #: 21 Starting Date: 01/09/22    Chief Complaint: OBESITY  History of Present Illness   The patient, with a history of obesity, hyperlipidemia, and prediabetes, presents for a follow-up consultation. She has been actively working on diet and exercise to manage her conditions, adhering to a caloric goal of 1500 and engaging in walking exercises for about 20 minutes four times per week. Over the past month, she has lost a pound. She is also on Crestor for hyperlipidemia and metformin for prediabetes, both of which she tolerates well.  The patient has been using a combination of category four and journaling for dietary management, which she adheres to about 80% of the time. She expresses satisfaction with the freedom of journaling as it allows her to tailor her meals and create recipes. However, she reports a plateau in her weight loss over the past six months despite maintaining the same level of activity.  In addition to her physical health, the patient reports a slightly elevated stress level due to personal and professional busyness. She also mentions some changes in her workplace strategies due to a saline shortage, but she currently has an adequate supply. 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  The patient  requests a refill of her Crestor prescription and reports no issues with her current medications, including metformin and Wellbutrin. She takes her metformin reliably twice a day, around seven in the morning and between twelve and two in the afternoon. She reports no gastrointestinal issues with metformin. However, she notes that if she takes Wellbutrin past two o'clock, it keeps her up all night.          PHYSICAL EXAM:  Blood pressure 102/72, pulse 66, temperature 98.2 F (36.8 C), height 5\' 7"  (1.702 m), weight 253 lb (114.8 kg), SpO2 99%. Body mass index is 39.63 kg/m.  DIAGNOSTIC DATA REVIEWED:  BMET    Component Value Date/Time   NA 140 03/26/2023 0734   K 4.4 03/26/2023 0734   CL 107 (H) 03/26/2023 0734   CO2 17 (L) 03/26/2023 0734   GLUCOSE 88 03/26/2023 0734   GLUCOSE 108 (H) 07/10/2021 0912   BUN 19 03/26/2023 0734   CREATININE 0.90 03/26/2023 0734   CREATININE 0.85 07/10/2021 0912   CALCIUM 8.5 (L) 03/26/2023 0734   GFRNONAA 86 04/30/2019 0904   GFRAA 99 04/30/2019 0904   Lab Results  Component Value Date   HGBA1C 5.1 03/26/2023   HGBA1C 5.1 04/30/2019   Lab Results  Component Value Date   INSULIN 6.8 03/26/2023   INSULIN 8.0 01/09/2022   Lab Results  Component Value Date   TSH 2.700 03/26/2023   CBC    Component Value Date/Time   WBC 5.4 03/26/2023 0734   WBC 5.8 07/10/2021 0912   RBC 4.49 03/26/2023 0734  RBC 4.59 07/10/2021 0912   HGB 14.7 03/26/2023 0734   HCT 44.6 03/26/2023 0734   PLT 207 03/26/2023 0734   MCV 99 (H) 03/26/2023 0734   MCH 32.7 03/26/2023 0734   MCH 32.5 07/10/2021 0912   MCHC 33.0 03/26/2023 0734   MCHC 33.9 07/10/2021 0912   RDW 11.6 (L) 03/26/2023 0734   Iron Studies No results found for: "IRON", "TIBC", "FERRITIN", "IRONPCTSAT" Lipid Panel     Component Value Date/Time   CHOL 145 03/26/2023 0734   TRIG 79 03/26/2023 0734   HDL 52 03/26/2023 0734   CHOLHDL 2.6 12/26/2021 1158   LDLCALC 78 03/26/2023 0734    LDLCALC 72 12/26/2021 1158   Hepatic Function Panel     Component Value Date/Time   PROT 6.2 03/26/2023 0734   ALBUMIN 4.0 03/26/2023 0734   AST 14 03/26/2023 0734   ALT 11 03/26/2023 0734   ALKPHOS 64 03/26/2023 0734   BILITOT 0.5 03/26/2023 0734   BILIDIR 0.1 08/17/2019 0902   IBILI 0.5 08/17/2019 0902      Component Value Date/Time   TSH 2.700 03/26/2023 0734   Nutritional Lab Results  Component Value Date   VD25OH 66.3 03/26/2023   VD25OH 51.3 09/25/2022   VD25OH 53.3 01/09/2022     Assessment and Plan    Obesity Obesity with weight loss plateau despite calorie-restricted diet and regular exercise. Discussed strength training to boost metabolism and protein intake to support muscle building. Explained benefits of ankle weights and accurate food journaling. - Incorporate strength training exercises, focusing on core and large muscle groups, 2-3 times per week - Consider using 2-5 pound ankle weights during walks - Continue current diet and exercise regimen - Follow up after the holidays to reassess progress  Emotional Eating Behaviors Emotional eating managed with Wellbutrin. Difficulty sleeping if taken past 2 PM. - Continue Wellbutrin as prescribed - Ensure Wellbutrin is taken before 2 PM to avoid insomnia  Hyperlipidemia Hyperlipidemia managed with Crestor. No issues reported. Refill requested. - Refill Crestor  Prediabetes Prediabetes managed with metformin. No gastrointestinal issues reported. Medication taken reliably twice a day. - Continue metformin as prescribed  General Health Maintenance Discussed strategies for managing holiday eating and stress, including the 'don't let it touch' strategy to reduce caloric intake by 40%. Emphasized protein intake and mindful eating. - Implement 'don't let it touch' strategy during holiday meals to control portion sizes - Ensure adequate protein intake during meals - Monitor stress levels and manage  accordingly  Follow-up - Schedule December and January follow-up appointments - Contact the clinic if any issues arise before the next scheduled visit.        She was informed of the importance of frequent follow up visits to maximize her success with intensive lifestyle modifications for her multiple health conditions.    Quillian Quince, MD

## 2023-05-28 ENCOUNTER — Ambulatory Visit (HOSPITAL_BASED_OUTPATIENT_CLINIC_OR_DEPARTMENT_OTHER)
Admission: RE | Admit: 2023-05-28 | Discharge: 2023-05-28 | Disposition: A | Discharge status: Home / Self Care | Payer: BC Managed Care – PPO | Source: Ambulatory Visit | Attending: Obstetrics & Gynecology | Admitting: Obstetrics & Gynecology

## 2023-05-28 ENCOUNTER — Encounter (HOSPITAL_BASED_OUTPATIENT_CLINIC_OR_DEPARTMENT_OTHER): Payer: Self-pay | Admitting: Radiology

## 2023-05-28 DIAGNOSIS — Z1231 Encounter for screening mammogram for malignant neoplasm of breast: Secondary | ICD-10-CM | POA: Insufficient documentation

## 2023-06-04 ENCOUNTER — Encounter (INDEPENDENT_AMBULATORY_CARE_PROVIDER_SITE_OTHER): Payer: Self-pay | Admitting: Family Medicine

## 2023-06-04 ENCOUNTER — Ambulatory Visit (INDEPENDENT_AMBULATORY_CARE_PROVIDER_SITE_OTHER): Payer: BC Managed Care – PPO | Admitting: Family Medicine

## 2023-06-04 VITALS — BP 107/75 | HR 63 | Temp 97.5°F | Ht 67.0 in | Wt 254.0 lb

## 2023-06-04 DIAGNOSIS — E88819 Insulin resistance, unspecified: Secondary | ICD-10-CM

## 2023-06-04 DIAGNOSIS — F3289 Other specified depressive episodes: Secondary | ICD-10-CM

## 2023-06-04 DIAGNOSIS — F5089 Other specified eating disorder: Secondary | ICD-10-CM | POA: Diagnosis not present

## 2023-06-04 DIAGNOSIS — Z6839 Body mass index (BMI) 39.0-39.9, adult: Secondary | ICD-10-CM

## 2023-06-04 DIAGNOSIS — E669 Obesity, unspecified: Secondary | ICD-10-CM | POA: Diagnosis not present

## 2023-06-04 MED ORDER — BUPROPION HCL ER (SR) 150 MG PO TB12: 150.0000 mg | ORAL_TABLET | Freq: Two times a day (BID) | ORAL | 0 refills | Status: DC

## 2023-06-04 MED ORDER — METFORMIN HCL 500 MG PO TABS: 500.0000 mg | ORAL_TABLET | Freq: Two times a day (BID) | ORAL | 0 refills | Status: DC

## 2023-06-04 NOTE — Progress Notes (Signed)
.smr  Office: [phone]  /  Fax: [phone]  WEIGHT SUMMARY AND BIOMETRICS  Anthropometric Measurements Height: 5\' 7"  (1.702 m) Weight: 254 lb (115.2 kg) BMI (Calculated): 39.77 Weight at Last Visit: 253 lb Weight Lost Since Last Visit: 0 Weight Gained Since Last Visit: 1 lb Starting Weight: 300 lb Total Weight Loss (lbs): 3 lb (1.361 kg)   Body Composition  Body Fat %: 49 % Fat Mass (lbs): 124.4 lbs Muscle Mass (lbs): 123 lbs Total Body Water (lbs): 92.2 lbs Visceral Fat Rating : 14   Other Clinical Data Fasting: No Labs: No Today's Visit #: 22 Starting Date: 01/09/22    Chief Complaint: OBESITY   History of Present Illness   The patient, diagnosed with insulin resistance, obesity, and emotional eating behaviors, presents for a follow-up consultation. Over the past month, including the Thanksgiving holiday, she reports a weight gain of one pound. She has been maintaining a food journal, aiming for a daily intake of 1500 calories and at least 85 grams of protein, which she feels she has been successful with about 75% of the time.  In terms of physical activity, the patient has been engaging in circuit training for 30 minutes, twice a week. She has recently switched to this form of exercise as it can be done indoors, which is beneficial during the darker winter months. However, she reports experiencing significant muscle soreness the day after training.  The patient also discusses her medication regimen, specifically mentioning Wellbutrin. She admits to only taking the second dose about half the time due to a busy schedule. She also notes that if she takes it too late in the day, it disrupts her sleep. She expresses interest in exploring different medication options in the future.  In addition to these health concerns, the patient is also planning for an upcoming cruise. She expresses concerns about maintaining her dietary and exercise routines during the trip,  particularly in relation to buffet meals and alcohol consumption. She is seeking advice on how to manage these challenges.          PHYSICAL EXAM:  Blood pressure 107/75, pulse 63, temperature (!) 97.5 F (36.4 C), height 5\' 7"  (1.702 m), weight 254 lb (115.2 kg), SpO2 99%. Body mass index is 39.78 kg/m.  DIAGNOSTIC DATA REVIEWED:  BMET    Component Value Date/Time   NA 140 03/26/2023 0734   K 4.4 03/26/2023 0734   CL 107 (H) 03/26/2023 0734   CO2 17 (L) 03/26/2023 0734   GLUCOSE 88 03/26/2023 0734   GLUCOSE 108 (H) 07/10/2021 0912   BUN 19 03/26/2023 0734   CREATININE 0.90 03/26/2023 0734   CREATININE 0.85 07/10/2021 0912   CALCIUM 8.5 (L) 03/26/2023 0734   GFRNONAA 86 04/30/2019 0904   GFRAA 99 04/30/2019 0904   Lab Results  Component Value Date   HGBA1C 5.1 03/26/2023   HGBA1C 5.1 04/30/2019   Lab Results  Component Value Date   INSULIN 6.8 03/26/2023   INSULIN 8.0 01/09/2022   Lab Results  Component Value Date   TSH 2.700 03/26/2023   CBC    Component Value Date/Time   WBC 5.4 03/26/2023 0734   WBC 5.8 07/10/2021 0912   RBC 4.49 03/26/2023 0734   RBC 4.59 07/10/2021 0912   HGB 14.7 03/26/2023 0734   HCT 44.6 03/26/2023 0734   PLT 207 03/26/2023 0734   MCV 99 (H) 03/26/2023 0734   MCH 32.7 03/26/2023 0734   MCH 32.5 07/10/2021 0912   MCHC 33.0  03/26/2023 0734   MCHC 33.9 07/10/2021 0912   RDW 11.6 (L) 03/26/2023 0734   Iron Studies No results found for: "IRON", "TIBC", "FERRITIN", "IRONPCTSAT" Lipid Panel     Component Value Date/Time   CHOL 145 03/26/2023 0734   TRIG 79 03/26/2023 0734   HDL 52 03/26/2023 0734   CHOLHDL 2.6 12/26/2021 1158   LDLCALC 78 03/26/2023 0734   LDLCALC 72 12/26/2021 1158   Hepatic Function Panel     Component Value Date/Time   PROT 6.2 03/26/2023 0734   ALBUMIN 4.0 03/26/2023 0734   AST 14 03/26/2023 0734   ALT 11 03/26/2023 0734   ALKPHOS 64 03/26/2023 0734   BILITOT 0.5 03/26/2023 0734   BILIDIR 0.1  08/17/2019 0902   IBILI 0.5 08/17/2019 0902      Component Value Date/Time   TSH 2.700 03/26/2023 0734   Nutritional Lab Results  Component Value Date   VD25OH 66.3 03/26/2023   VD25OH 51.3 09/25/2022   VD25OH 53.3 01/09/2022     Assessment and Plan    Insulin Resistance Insulin resistance with recent weight gain of one pound over Thanksgiving. Patient adheres to a 1500-calorie diet with at least 85 grams of protein 75% of the time and engages in circuit training for 30 minutes twice a week. Discussed holiday eating challenges and strategies for upcoming cruise. Alternative medication options for insulin resistance were considered, noting GLP-1s are likely not covered by insurance. - Continue food journal with 1500 calories and at least 85 grams of protein - Continue circuit training for 30 minutes twice a week - Implement cruise eating strategies: prefer dining room meals, monitor alcohol intake, increase physical activity by taking stairs - Consider alternative medication options for insulin resistance post-holidays  Obesity Obesity with ongoing weight management through diet and exercise. Patient has maintained muscle mass despite minor weight gain. Discussed portion control, protein intake, and the impact of holiday events on weight management. - Continue current dietary and exercise regimen - Monitor weight and muscle mass - Reassess weight management strategies in February    Emotional Eating Behaviors EEB managed with Wellbutrin. Patient reports difficulty with the second dose due to a busy schedule and sleep disruption concerns. Discussed the need for a strict cutoff time for the second dose and potential reassessment of medication options post-holidays. - Continue Wellbutrin with a strict cutoff time for the second dose - Reassess medication options in February if issues persist - Document eating behaviors - Implement strategies to manage emotional eating, such as  portion control and mindful eating   Follow-up 4 weeks and - Schedule follow-up appointment in February - Contact clinic if any issues arise before the next appointment.        She was informed of the importance of frequent follow up visits to maximize her success with intensive lifestyle modifications for her multiple health conditions.    Quillian Quince, MD

## 2023-07-01 ENCOUNTER — Ambulatory Visit (HOSPITAL_BASED_OUTPATIENT_CLINIC_OR_DEPARTMENT_OTHER): Payer: BC Managed Care – PPO | Admitting: Obstetrics & Gynecology

## 2023-07-09 ENCOUNTER — Ambulatory Visit (INDEPENDENT_AMBULATORY_CARE_PROVIDER_SITE_OTHER): Payer: BC Managed Care – PPO | Admitting: Family Medicine

## 2023-07-09 ENCOUNTER — Encounter (INDEPENDENT_AMBULATORY_CARE_PROVIDER_SITE_OTHER): Payer: Self-pay | Admitting: Family Medicine

## 2023-07-15 ENCOUNTER — Other Ambulatory Visit (HOSPITAL_COMMUNITY)
Admission: RE | Admit: 2023-07-15 | Discharge: 2023-07-15 | Disposition: A | Discharge status: Home / Self Care | Payer: BC Managed Care – PPO | Source: Ambulatory Visit | Attending: Obstetrics & Gynecology | Admitting: Obstetrics & Gynecology

## 2023-07-15 ENCOUNTER — Ambulatory Visit (HOSPITAL_BASED_OUTPATIENT_CLINIC_OR_DEPARTMENT_OTHER): Payer: BC Managed Care – PPO | Admitting: Obstetrics & Gynecology

## 2023-07-15 ENCOUNTER — Encounter (HOSPITAL_BASED_OUTPATIENT_CLINIC_OR_DEPARTMENT_OTHER): Payer: Self-pay | Admitting: Obstetrics & Gynecology

## 2023-07-15 VITALS — BP 119/78 | HR 72 | Ht 67.0 in | Wt 260.6 lb

## 2023-07-15 DIAGNOSIS — Z124 Encounter for screening for malignant neoplasm of cervix: Secondary | ICD-10-CM

## 2023-07-15 DIAGNOSIS — Z30432 Encounter for removal of intrauterine contraceptive device: Secondary | ICD-10-CM | POA: Diagnosis not present

## 2023-07-15 DIAGNOSIS — Z8742 Personal history of other diseases of the female genital tract: Secondary | ICD-10-CM

## 2023-07-15 DIAGNOSIS — Z86711 Personal history of pulmonary embolism: Secondary | ICD-10-CM | POA: Diagnosis not present

## 2023-07-15 DIAGNOSIS — Z01419 Encounter for gynecological examination (general) (routine) without abnormal findings: Secondary | ICD-10-CM

## 2023-07-15 DIAGNOSIS — Z1211 Encounter for screening for malignant neoplasm of colon: Secondary | ICD-10-CM | POA: Diagnosis not present

## 2023-07-15 NOTE — Progress Notes (Unsigned)
43 y.o. G0P0000 Single White or Caucasian female here for annual exam.  Has Mirena IUD that is being used for menorrhagia and, in the past, contraception.  Is still on eliquis due to h/o PE.  Dosing likely will be decreased.  Actively working on weight loss.  Would like IUD removal today as has been five years of use.  Would like to see what cycles are like without this.  Significant other has had vasectomy.  No LMP recorded. (Menstrual status: IUD).          Sexually active: Yes.    The current method of family planning is IUD and and vasectomy.     Health Maintenance: Pap:  obtained today History of abnormal Pap:  yes MMG:  05/2023 Colonoscopy:  referral placed Screening Labs: does with Dr. Dalbert Garnet   reports that she has never smoked. She has never used smokeless tobacco. She reports current alcohol use. She reports that she does not use drugs.  Past Medical History:  Diagnosis Date   Back pain    GERD (gastroesophageal reflux disease)    resolved    H/O blood clots    High cholesterol    Hypertension    Hypothyroidism    Joint pain    PONV (postoperative nausea and vomiting)    mild nausea responsive to meds    Pulmonary embolism (HCC) 10/2017   Thyroid carcinoma (HCC)    Vitamin D deficiency     Past Surgical History:  Procedure Laterality Date   broken wrist     KNEE SURGERY     arthroscopy    THYROIDECTOMY Right 07/24/2018   Procedure: RIGHT THYROID LOBECTOMY  AND ISTHMUS;  Surgeon: Darnell Level, MD;  Location: [facility];  Service: General;  Laterality: Right;    Current Outpatient Medications  Medication Sig Dispense Refill   apixaban (ELIQUIS) 5 MG TABS tablet Take 1 tablet (5 mg total) by mouth 2 (two) times daily. 180 tablet 3   buPROPion (WELLBUTRIN SR) 150 MG 12 hr tablet Take 1 tablet (150 mg total) by mouth 2 (two) times daily. 180 tablet 0   levonorgestrel (MIRENA) 20 MCG/24HR IUD 1 each by Intrauterine route once. Placed 11/14/18     levothyroxine (SYNTHROID)  75 MCG tablet Take by mouth 1 tablet daily 90 tablet 3   metFORMIN (GLUCOPHAGE) 500 MG tablet Take 1 tablet (500 mg total) by mouth 2 (two) times daily with a meal. 180 tablet 0   metoprolol tartrate (LOPRESSOR) 25 MG tablet TAKE ONE-HALF (1/2) TABLET TWICE A DAY 90 tablet 3   rosuvastatin (CRESTOR) 5 MG tablet Take 1 tablet (5 mg total) by mouth daily. 90 tablet 0   Vitamin D, Ergocalciferol, (DRISDOL) 1.25 MG (50000 UNIT) CAPS capsule Take 1 capsule (50,000 Units total) by mouth once a week. 13 capsule 0   No current facility-administered medications for this visit.    Family History  Problem Relation Age of Onset   Thyroid disease Mother    Heart disease Paternal Grandmother    Heart disease Paternal Grandfather    Breast cancer Maternal Aunt 50    ROS: Constitutional: negative Genitourinary:negative  Exam:   BP 119/78 (BP Location: Right Arm, Patient Position: Sitting, Cuff Size: Large)   Pulse 72   Ht 5\' 7"  (1.702 m) Comment: Reported  Wt 260 lb 9.6 oz (118.2 kg)   BMI 40.82 kg/m   Height: 5\' 7"  (170.2 cm) (Reported)  General appearance: alert, cooperative and appears stated age Head: Normocephalic, without  obvious abnormality, atraumatic Neck: no adenopathy, supple, symmetrical, trachea midline and thyroid normal to inspection and palpation Lungs: clear to auscultation bilaterally Breasts: normal appearance, no masses or tenderness Heart: regular rate and rhythm Abdomen: soft, non-tender; bowel sounds normal; no masses,  no organomegaly Extremities: extremities normal, atraumatic, no cyanosis or edema Skin: Skin color, texture, turgor normal. No rashes or lesions Lymph nodes: Cervical, supraclavicular, and axillary nodes normal. No abnormal inguinal nodes palpated Neurologic: Grossly normal   Pelvic: External genitalia:  no lesions              Urethra:  normal appearing urethra with no masses, tenderness or lesions              Bartholins and Skenes: normal                  Vagina: normal appearing vagina with normal color and no discharge, no lesions              Cervix: no lesions              Pap taken: Yes.   Bimanual Exam:  Uterus:  normal size, contour, position, consistency, mobility, non-tender              Adnexa: normal adnexa and no mass, fullness, tenderness               Rectovaginal: Confirms               Anus:  normal sphincter tone, no lesions  Procedure:  after confirming desire for removal, IUD string grasped with ringed forceps and removed with one pull and intact.  Pt did have mild vagal reaction which resolved quickly with lying back and cold water, cold washcloth.  Speculum removed.  Tolerated procedure well.  Chaperone, Ina Homes, CMA, was present for exam.  Assessment/Plan: 1. Well woman exam with routine gynecological exam (Primary) - Pap smear obtained otday - Mammogram 05/2023 - Colonoscopy referral placed - lab work done with Dr. Dalbert Garnet - vaccines reviewed/updated  2. Encounter for IUD removal - IUD removed without difficulty today  3. Colon cancer screening - Ambulatory referral to Gastroenterology  4. Cervical cancer screening - Cytology - PAP( [facility])  5. History of menorrhagia  6. History of abnormal cervical Pap smear - pap and HR HPV updated today  7. History of pulmonary embolism - still on eliquis

## 2023-07-20 LAB — CYTOLOGY - PAP
Comment: NEGATIVE
Diagnosis: NEGATIVE
High risk HPV: NEGATIVE

## 2023-07-22 ENCOUNTER — Encounter (HOSPITAL_BASED_OUTPATIENT_CLINIC_OR_DEPARTMENT_OTHER): Payer: Self-pay | Admitting: Obstetrics & Gynecology

## 2023-08-06 ENCOUNTER — Encounter (INDEPENDENT_AMBULATORY_CARE_PROVIDER_SITE_OTHER): Payer: Self-pay | Admitting: Family Medicine

## 2023-08-06 ENCOUNTER — Ambulatory Visit (INDEPENDENT_AMBULATORY_CARE_PROVIDER_SITE_OTHER): Payer: BC Managed Care – PPO | Admitting: Family Medicine

## 2023-08-06 ENCOUNTER — Other Ambulatory Visit (INDEPENDENT_AMBULATORY_CARE_PROVIDER_SITE_OTHER): Payer: Self-pay | Admitting: Family Medicine

## 2023-08-06 VITALS — BP 124/83 | HR 70 | Temp 98.1°F | Ht 67.0 in | Wt 261.0 lb

## 2023-08-06 DIAGNOSIS — Z6841 Body Mass Index (BMI) 40.0 and over, adult: Secondary | ICD-10-CM

## 2023-08-06 DIAGNOSIS — E669 Obesity, unspecified: Secondary | ICD-10-CM

## 2023-08-06 DIAGNOSIS — E559 Vitamin D deficiency, unspecified: Secondary | ICD-10-CM

## 2023-08-06 DIAGNOSIS — E88819 Insulin resistance, unspecified: Secondary | ICD-10-CM | POA: Diagnosis not present

## 2023-08-06 DIAGNOSIS — Z6839 Body mass index (BMI) 39.0-39.9, adult: Secondary | ICD-10-CM

## 2023-08-06 MED ORDER — VITAMIN D (ERGOCALCIFEROL) 1.25 MG (50000 UNIT) PO CAPS: 50000.0000 [IU] | ORAL_CAPSULE | ORAL | 0 refills | Status: DC

## 2023-08-06 MED ORDER — TIRZEPATIDE-WEIGHT MANAGEMENT 2.5 MG/0.5ML ~~LOC~~ SOLN: 2.5000 mg | SUBCUTANEOUS | 0 refills | Status: DC

## 2023-08-06 NOTE — Progress Notes (Signed)
 Penny Hancock, D.O.  ABFM, ABOM Specializing in Clinical Bariatric Medicine  Office located at: [address] [place], [place]  [address]   Assessment and Plan:   FOR THE DISEASE OF OBESITY: BMI 39.0-39.9,adult- current BIM 40.87 Obesity, Beginning BMI 46.99 Assessment & Plan: Since last office visit on 06/04/2023 patient's muscle mass has increased by 1.4 lb. Fat mass has increased by 6 lb. Total body water has increased by 0.8 lb.  Counseling done on how various foods will affect these numbers and how to maximize success  Total lbs lost to date: 39 lbs  Total weight loss percentage to date: 13%    Recommended Dietary Goals Penny Hancock is currently in the action stage of change. As such, her goal is to continue weight management plan.  She has agreed to: continue current plan   Behavioral Intervention We discussed the following today: continue to work on maintaining a reduced calorie state, getting the recommended amount of protein, incorporating whole foods, making healthy choices, staying well hydrated.  Additional resources provided today: None  Evidence-based interventions for health behavior change were utilized today including the discussion of self monitoring techniques, problem-solving barriers and SMART goal setting techniques.   Regarding patient's less desirable eating habits and patterns, we employed the technique of small changes.   Pt will specifically work on: n/a   Recommended Physical Activity Goals Penny Hancock has been advised to work up to 150 minutes of moderate intensity aerobic activity a week and strengthening exercises 2-3 times per week for cardiovascular health, weight loss maintenance and preservation of muscle mass.   She has agreed to : start some form of resistance training 20 minutes, 2 days a week and some form of cardio 30 minutes, 3 days a week.    Pharmacotherapy We both agreed to :  continue Metformin and start GLP-1.   FOR  ASSOCIATED CONDITIONS ADDRESSED TODAY:  Insulin resistance Assessment & Plan: Relevant medication: Metformin 500 mg two times daily. Pt denies N/V/D. Pt acknowledges not feeling satiated and having hunger despite adhering to her journaling parameters.   Shared decision: start Zepbound 2.5 mg weekly. Patient denies a personal history of pancreatitis;  and denies a family history of medullary thyroid carcinoma or multiple endocrine neoplasia type II.  Potential risks/ benefits of the medication were explained to patient. Explained that the more she eats "off-plan", the more likely for her to have side effects of the drug.  All questions were answered appropriately and alternative treatment options were discussed; patient wishes to move forward. Continue Metformin at current dose - may consider weaning her off in the future.    Orders:  -     Tirzepatide-Weight Management; Inject 2.5 mg into the skin once a week.  Dispense: 2 mL; Refill: 0   Vitamin D deficiency Assessment & Plan: Vitamin D levels are at goal. Pt is doing well on ERGO 50,000 weekly . Continue regimen. Recheck Vitamin D today as part of routine screening.   Orders:  -     Vitamin D (Ergocalciferol); Take 1 capsule (50,000 Units total) by mouth once a week.  Dispense: 13 capsule; Refill: 0 -     VITAMIN D 25 Hydroxy (Vit-D Deficiency, Fractures)   Follow up:   Return 09/10/2023. She was informed of the importance of frequent follow up visits to maximize her success with intensive lifestyle modifications for her multiple health conditions.  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  She is aware that if anything is critical/ life threatening with the results, we will be contacting her via MyChart or by my CMA will be calling them prior to the office visit to discuss acute management.     Subjective:   Chief complaint: Obesity Penny Hancock is here to discuss  her progress with her obesity treatment plan. She adheres to a 1500-calorie diet with at least 85 grams of protein and states she is following her eating plan approximately 75% of the time. She states she is walking 30 minutes, 1 day a week.   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 This is my first appointment with Dr.Bair - she is typically seen by [facility]. Since last OV on 06/04/23, she is up 7 lbs. She feels that this weight gain was from the holidays, a cruise vacation, and being "sick a couple of times".  Pt acknowledges not feeling satiated and having hunger despite adhering to her journaling parameters. Also, she desires to increase her exercise - in the past she did cardio 3 days a week.   Pharmacotherapy for weight loss: She is currently taking  Wellbutrin SR 150 mg two times daily & Metformin 500 mg two times daily .   Review of Systems:  Pertinent positives were addressed with patient today.  Reviewed by clinician on day of visit: allergies, medications, problem list, medical history, surgical history, family history, social history, and previous encounter notes.  Weight Summary and Biometrics   Weight Lost Since Last Visit: 0  Weight Gained Since Last Visit: 7lb   Vitals Temp: 98.1 F (36.7 C) BP: 124/83 Pulse Rate: 70 SpO2: 99 %   Anthropometric Measurements Height: 5\' 7"  (1.702 m) Weight: 261 lb (118.4 kg) BMI (Calculated): 40.87 Weight at Last Visit: 254lb Weight Lost Since Last Visit: 0 Weight Gained Since Last Visit: 7lb Starting Weight: 300lb Total Weight Loss (lbs): 39 lb (17.7 kg)   Body Composition  Body Fat %: 49.9 % Fat Mass (lbs): 130.4 lbs Muscle Mass (lbs): 124.4 lbs Total Body Water (lbs): 93 lbs Visceral Fat Rating : 14   Other Clinical Data Fasting: no Labs: no Today's Visit #: 23 Starting Date: 01/09/22   Objective:   PHYSICAL EXAM: Blood pressure 124/83, pulse 70, temperature  98.1 F (36.7 C), height 5\' 7"  (1.702 m), weight 261 lb (118.4 kg), SpO2 99%. Body mass index is 40.88 kg/m.  General: she is overweight, cooperative and in no acute distress. PSYCH: Has normal mood, affect and thought process.   HEENT: EOMI, sclerae are anicteric. Lungs: Normal breathing effort, no conversational dyspnea. Extremities: Moves * 4 Neurologic: A and O * 3, good insight  DIAGNOSTIC DATA REVIEWED: BMET    Component Value Date/Time   NA 140 03/26/2023 0734   K 4.4 03/26/2023 0734   CL 107 (H) 03/26/2023 0734   CO2 17 (L) 03/26/2023 0734   GLUCOSE 88 03/26/2023 0734   GLUCOSE 108 (H) 07/10/2021 0912   BUN 19 03/26/2023 0734   CREATININE 0.90 03/26/2023 0734   CREATININE 0.85 07/10/2021 0912   CALCIUM 8.5 (L) 03/26/2023 0734   GFRNONAA 86 04/30/2019 0904   GFRAA 99 04/30/2019 0904   Lab Results  Component Value Date   HGBA1C 5.1 03/26/2023   HGBA1C 5.1 04/30/2019   Lab Results  Component Value Date   INSULIN 6.8 03/26/2023   INSULIN 8.0 01/09/2022   Lab Results  Component  Value Date   TSH 2.700 03/26/2023   CBC    Component Value Date/Time   WBC 5.4 03/26/2023 0734   WBC 5.8 07/10/2021 0912   RBC 4.49 03/26/2023 0734   RBC 4.59 07/10/2021 0912   HGB 14.7 03/26/2023 0734   HCT 44.6 03/26/2023 0734   PLT 207 03/26/2023 0734   MCV 99 (H) 03/26/2023 0734   MCH 32.7 03/26/2023 0734   MCH 32.5 07/10/2021 0912   MCHC 33.0 03/26/2023 0734   MCHC 33.9 07/10/2021 0912   RDW 11.6 (L) 03/26/2023 0734   Iron Studies No results found for: "IRON", "TIBC", "FERRITIN", "IRONPCTSAT" Lipid Panel     Component Value Date/Time   CHOL 145 03/26/2023 0734   TRIG 79 03/26/2023 0734   HDL 52 03/26/2023 0734   CHOLHDL 2.6 12/26/2021 1158   LDLCALC 78 03/26/2023 0734   LDLCALC 72 12/26/2021 1158   Hepatic Function Panel     Component Value Date/Time   PROT 6.2 03/26/2023 0734   ALBUMIN 4.0 03/26/2023 0734   AST 14 03/26/2023 0734   ALT 11 03/26/2023 0734    ALKPHOS 64 03/26/2023 0734   BILITOT 0.5 03/26/2023 0734   BILIDIR 0.1 08/17/2019 0902   IBILI 0.5 08/17/2019 0902      Component Value Date/Time   TSH 2.700 03/26/2023 0734   Nutritional Lab Results  Component Value Date   VD25OH 66.3 03/26/2023   VD25OH 51.3 09/25/2022   VD25OH 53.3 01/09/2022    Attestations:   I, Special Puri, acting as a Stage manager for [employer], DO., have compiled all relevant documentation for today's office visit on behalf of Thomasene Lot, DO, while in the presence of [employer], DO.  Reviewed by clinician on day of visit: allergies, medications, problem list, medical history, surgical history, family history, social history, and previous encounter notes pertinent to patient's obesity diagnosis. I have spent 40 minutes in the care of the patient today including: preparing to see patient (e.g. review and interpretation of tests, old notes ), obtaining and/or reviewing separately obtained history, performing a medically appropriate examination or evaluation, counseling and educating the patient, ordering medications, test or procedures, documenting clinical information in the electronic or other health care record, and independently interpreting results and communicating results to the patient, family, or caregiver   I have reviewed the above documentation for accuracy and completeness, and I agree with the above. Penny Hancock, D.O.  The 21st Century Cures Act was signed into law in 2016 which includes the topic of electronic health records.  This provides immediate access to information in MyChart.  This includes consultation notes, operative notes, office notes, lab results and pathology reports.  If you have any questions about what you read please let us know at your next visit so we can discuss your concerns and take corrective action if need be.  We are right here with you.

## 2023-08-07 LAB — VITAMIN D 25 HYDROXY (VIT D DEFICIENCY, FRACTURES): Vit D, 25-Hydroxy: 67.2 ng/mL (ref 30.0–100.0)

## 2023-08-08 ENCOUNTER — Telehealth: Payer: Self-pay

## 2023-08-08 NOTE — Telephone Encounter (Signed)
Received through Cover My Meds that Zerpbound has been denied. Plan exclusion.

## 2023-09-10 ENCOUNTER — Ambulatory Visit (INDEPENDENT_AMBULATORY_CARE_PROVIDER_SITE_OTHER): Payer: BC Managed Care – PPO | Admitting: Family Medicine

## 2023-09-10 ENCOUNTER — Encounter (INDEPENDENT_AMBULATORY_CARE_PROVIDER_SITE_OTHER): Payer: Self-pay | Admitting: Family Medicine

## 2023-09-10 VITALS — BP 119/80 | Ht 67.0 in | Wt 255.0 lb

## 2023-09-10 DIAGNOSIS — I10 Essential (primary) hypertension: Secondary | ICD-10-CM | POA: Diagnosis not present

## 2023-09-10 DIAGNOSIS — E88819 Insulin resistance, unspecified: Secondary | ICD-10-CM | POA: Diagnosis not present

## 2023-09-10 DIAGNOSIS — Z6841 Body Mass Index (BMI) 40.0 and over, adult: Secondary | ICD-10-CM

## 2023-09-10 DIAGNOSIS — E785 Hyperlipidemia, unspecified: Secondary | ICD-10-CM

## 2023-09-10 DIAGNOSIS — R632 Polyphagia: Secondary | ICD-10-CM | POA: Diagnosis not present

## 2023-09-10 DIAGNOSIS — E669 Obesity, unspecified: Secondary | ICD-10-CM

## 2023-09-10 DIAGNOSIS — E7800 Pure hypercholesterolemia, unspecified: Secondary | ICD-10-CM

## 2023-09-10 MED ORDER — TIRZEPATIDE-WEIGHT MANAGEMENT 2.5 MG/0.5ML ~~LOC~~ SOLN: 2.5000 mg | SUBCUTANEOUS | 2 refills | Status: DC

## 2023-09-10 NOTE — Progress Notes (Unsigned)
 Office: [phone]  /  Fax: [phone]  WEIGHT SUMMARY AND BIOMETRICS  Anthropometric Measurements Height: 5\' 7"  (1.702 m) Weight: 255 lb (115.7 kg) BMI (Calculated): 39.93 Weight at Last Visit: 261 lb Weight Lost Since Last Visit: 6 lb Weight Gained Since Last Visit: 0 Starting Weight: 300 lb Total Weight Loss (lbs): 45 lb (20.4 kg)   Body Composition  Body Fat %: 48.3 % Fat Mass (lbs): 123.4 lbs Muscle Mass (lbs): 125.6 lbs Total Body Water (lbs): 91.6 lbs Visceral Fat Rating : 13   Other Clinical Data Fasting: No Labs: No Today's Visit #: 24 Starting Date: 01/09/22    Chief Complaint: OBESITY   History of Present Illness Elenora Gamma, 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  She has been adhering to a diet plan with a goal of 1500 calories and 85 or more grams of protein, achieving this about 90% of the time. She has incorporated walking for exercise, approximately 20 minutes three times a week, and has lost six pounds over the past five weeks. She has been using Zepbound, which she describes as an appetite suppressant that helps her not think about food, allowing her to reduce her daily caloric intake to around 1000 calories. She aims to consume at least 1200 calories daily to avoid negative effects from lower intake.  She is currently on metoprolol for blood pressure management, which is controlled at 119/80. She is also taking Crestoril for hyperlipidemia and is actively decreasing her cholesterol intake. She continues to take metformin for its benefits on insulin resistance and potential weight loss enhancement.  She is planning a trip to [place] with friends, indicating a supportive social network and a lifestyle that includes travel. She reports a good stress level and enjoys traveling, with her work supporting this interest. No lightheadedness or dizziness.      PHYSICAL EXAM:  Blood  pressure 119/80, height 5\' 7"  (1.702 m), weight 255 lb (115.7 kg). Body mass index is 39.94 kg/m.  DIAGNOSTIC DATA REVIEWED:  BMET    Component Value Date/Time   NA 140 03/26/2023 0734   K 4.4 03/26/2023 0734   CL 107 (H) 03/26/2023 0734   CO2 17 (L) 03/26/2023 0734   GLUCOSE 88 03/26/2023 0734   GLUCOSE 108 (H) 07/10/2021 0912   BUN 19 03/26/2023 0734   CREATININE 0.90 03/26/2023 0734   CREATININE 0.85 07/10/2021 0912   CALCIUM 8.5 (L) 03/26/2023 0734   GFRNONAA 86 04/30/2019 0904   GFRAA 99 04/30/2019 0904   Lab Results  Component Value Date   HGBA1C 5.1 03/26/2023   HGBA1C 5.1 04/30/2019   Lab Results  Component Value Date   INSULIN 6.8 03/26/2023   INSULIN 8.0 01/09/2022   Lab Results  Component Value Date   TSH 2.700 03/26/2023   CBC    Component Value Date/Time   WBC 5.4 03/26/2023 0734   WBC 5.8 07/10/2021 0912   RBC 4.49 03/26/2023 0734   RBC 4.59 07/10/2021 0912   HGB 14.7 03/26/2023 0734   HCT 44.6 03/26/2023 0734   PLT 207 03/26/2023 0734   MCV 99 (H) 03/26/2023 0734   MCH 32.7 03/26/2023 0734   MCH 32.5 07/10/2021 0912   MCHC 33.0 03/26/2023 0734   MCHC 33.9 07/10/2021 0912   RDW 11.6 (L) 03/26/2023 0734   Iron Studies No results found for: "IRON", "TIBC", "FERRITIN", "IRONPCTSAT" Lipid Panel     Component Value Date/Time  CHOL 145 03/26/2023 0734   TRIG 79 03/26/2023 0734   HDL 52 03/26/2023 0734   CHOLHDL 2.6 12/26/2021 1158   LDLCALC 78 03/26/2023 0734   LDLCALC 72 12/26/2021 1158   Hepatic Function Panel     Component Value Date/Time   PROT 6.2 03/26/2023 0734   ALBUMIN 4.0 03/26/2023 0734   AST 14 03/26/2023 0734   ALT 11 03/26/2023 0734   ALKPHOS 64 03/26/2023 0734   BILITOT 0.5 03/26/2023 0734   BILIDIR 0.1 08/17/2019 0902   IBILI 0.5 08/17/2019 0902      Component Value Date/Time   TSH 2.700 03/26/2023 0734   Nutritional Lab Results  Component Value Date   VD25OH 67.2 08/06/2023   VD25OH 66.3 03/26/2023    VD25OH 51.3 09/25/2022     Assessment and Plan Assessment & Plan Obesity She is actively working on weight loss through dietary changes and exercise, adhering to a 1500 calorie diet with at least 85 grams of protein 90% of the time, resulting in a 6-pound loss over 5 weeks. She exercises by walking 20 minutes three times weekly. Currently on Zepbound, an appetite suppressant without causing nausea or fullness. She considered increasing the dose from 2.5 mg to 5 mg but was advised against it due to potential risks of malnutrition and muscle mass loss. She agrees to continue with the current dose. - Continue Zepbound at 2.5 mg and send prescription to [facility] for vials. - Encourage continuation of current dietary and exercise regimen.  Insulin Resistance with Polyphagia She is on metformin, which is beneficial for insulin resistance and may enhance weight loss when combined with GLP-1 therapy. She has been advised to continue metformin. - Continue metformin as prescribed.  Hypertension Blood pressure is well-controlled at 119/80 mmHg. She is on metoprolol and is working on weight loss to further improve blood pressure control. No symptoms of lightheadedness or dizziness reported. - Continue metoprolol as prescribed.  Hyperlipidemia She is on Crestoril and is actively decreasing cholesterol intake as part of her treatment plan. Recent labs were not discussed, but Vitamin D levels were stable. - Continue Crestoril as prescribed. - Plan for fasting labs at next visit to monitor lipid levels.  General Health Maintenance She is planning a vacation to [place] and is advised on maintaining protein intake during travel. She is encouraged to pack protein-rich snacks like beef jerky to ensure adequate protein consumption. - Advise on maintaining protein intake during travel by packing protein-rich snacks. - Encourage adherence to a balanced diet while on vacation.  Follow-up She is  due for a follow-up visit in approximately one month, with plans for fasting labs to be drawn at that time. - Schedule follow-up appointment for April 29th at 7:20 AM with fasting labs to be drawn at 7:30 AM. - Plan for another follow-up appointment four weeks after the April 29th visit.   She was informed of the importance of frequent follow up visits to maximize her success with intensive lifestyle modifications for her multiple health conditions.    Quillian Quince, MD

## 2023-10-15 ENCOUNTER — Encounter (INDEPENDENT_AMBULATORY_CARE_PROVIDER_SITE_OTHER): Payer: Self-pay | Admitting: Family Medicine

## 2023-10-15 ENCOUNTER — Ambulatory Visit (INDEPENDENT_AMBULATORY_CARE_PROVIDER_SITE_OTHER): Admitting: Family Medicine

## 2023-10-15 VITALS — BP 103/73 | HR 69 | Temp 98.1°F | Ht 67.0 in | Wt 255.0 lb

## 2023-10-15 DIAGNOSIS — Z6839 Body mass index (BMI) 39.0-39.9, adult: Secondary | ICD-10-CM

## 2023-10-15 DIAGNOSIS — I10 Essential (primary) hypertension: Secondary | ICD-10-CM

## 2023-10-15 DIAGNOSIS — R7303 Prediabetes: Secondary | ICD-10-CM

## 2023-10-15 DIAGNOSIS — E669 Obesity, unspecified: Secondary | ICD-10-CM | POA: Diagnosis not present

## 2023-10-15 DIAGNOSIS — E7800 Pure hypercholesterolemia, unspecified: Secondary | ICD-10-CM

## 2023-10-15 DIAGNOSIS — E039 Hypothyroidism, unspecified: Secondary | ICD-10-CM

## 2023-10-15 DIAGNOSIS — E538 Deficiency of other specified B group vitamins: Secondary | ICD-10-CM | POA: Insufficient documentation

## 2023-10-15 DIAGNOSIS — R632 Polyphagia: Secondary | ICD-10-CM | POA: Insufficient documentation

## 2023-10-15 DIAGNOSIS — H04123 Dry eye syndrome of bilateral lacrimal glands: Secondary | ICD-10-CM | POA: Diagnosis not present

## 2023-10-15 DIAGNOSIS — E785 Hyperlipidemia, unspecified: Secondary | ICD-10-CM | POA: Diagnosis not present

## 2023-10-15 DIAGNOSIS — E559 Vitamin D deficiency, unspecified: Secondary | ICD-10-CM | POA: Diagnosis not present

## 2023-10-15 DIAGNOSIS — Z6841 Body Mass Index (BMI) 40.0 and over, adult: Secondary | ICD-10-CM

## 2023-10-15 DIAGNOSIS — E88819 Insulin resistance, unspecified: Secondary | ICD-10-CM | POA: Diagnosis not present

## 2023-10-15 DIAGNOSIS — H5213 Myopia, bilateral: Secondary | ICD-10-CM | POA: Diagnosis not present

## 2023-10-15 DIAGNOSIS — C73 Malignant neoplasm of thyroid gland: Secondary | ICD-10-CM | POA: Diagnosis not present

## 2023-10-15 MED ORDER — ROSUVASTATIN CALCIUM 5 MG PO TABS: 5.0000 mg | ORAL_TABLET | Freq: Every day | ORAL | 0 refills | Status: DC

## 2023-10-15 MED ORDER — TIRZEPATIDE-WEIGHT MANAGEMENT 5 MG/0.5ML ~~LOC~~ SOLN
5.0000 mg | SUBCUTANEOUS | 2 refills | Status: DC
Start: 2023-10-15 — End: 2023-11-12

## 2023-10-15 NOTE — Progress Notes (Signed)
 Office: [phone]  /  Fax: [phone]  WEIGHT SUMMARY AND BIOMETRICS  Anthropometric Measurements Height: 5\' 7"  (1.702 m) Weight: 255 lb (115.7 kg) BMI (Calculated): 39.93 Weight at Last Visit: 255lb Weight Lost Since Last Visit: 0 Weight Gained Since Last Visit: 0 Starting Weight: 300lb Total Weight Loss (lbs): 45 lb (20.4 kg)   Body Composition  Body Fat %: 48.2 % Fat Mass (lbs): 123.2 lbs Muscle Mass (lbs): 125.8 lbs Total Body Water (lbs): 89.6 lbs Visceral Fat Rating : 13   Other Clinical Data Fasting: yes Labs: Yes Today's Visit #: 25 Starting Date: 01/09/22    Chief Complaint: OBESITY    History of Present Illness Penny Finn, 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  She is adhering to a prescribed eating plan of 1500 calories and 85 grams of protein daily. Her physical activity includes walking and resistance training for 20 minutes twice a week. Despite these efforts, her weight has remained stable over the last month.  She has a history of prediabetes and is currently on metformin , which she takes five out of seven days a week.  She experiences polyphagia and is on a low dose of Zepbound, 2.5 mg per week. The medication provides relief for three to four days before its effects diminish. She experiences stomach discomfort if she consumes a large meal.  She has hyperlipidemia and is working on decreasing cholesterol through diet. She is on Crestor  5 mg and requests a refill.  She has a history of vitamin D  deficiency and is on prescription vitamin D , 50,000 IU per week. Her last level was at goal, and she is due for rechecking to ensure she is not at risk of over-replacement.  She has hypothyroidism and has been controlled on Synthroid  75 mcg.  She has a history of B12 deficiency and follows a B12-rich diet.      PHYSICAL EXAM:  Blood pressure 103/73, pulse 69,  temperature 98.1 F (36.7 C), height 5\' 7"  (1.702 m), weight 255 lb (115.7 kg), last menstrual period 09/30/2023, SpO2 99%. Body mass index is 39.94 kg/m.  DIAGNOSTIC DATA REVIEWED:  BMET    Component Value Date/Time   NA 140 03/26/2023 0734   K 4.4 03/26/2023 0734   CL 107 (H) 03/26/2023 0734   CO2 17 (L) 03/26/2023 0734   GLUCOSE 88 03/26/2023 0734   GLUCOSE 108 (H) 07/10/2021 0912   BUN 19 03/26/2023 0734   CREATININE 0.90 03/26/2023 0734   CREATININE 0.85 07/10/2021 0912   CALCIUM  8.5 (L) 03/26/2023 0734   GFRNONAA 86 04/30/2019 0904   GFRAA 99 04/30/2019 0904   Lab Results  Component Value Date   HGBA1C 5.1 03/26/2023   HGBA1C 5.1 04/30/2019   Lab Results  Component Value Date   INSULIN  6.8 03/26/2023   INSULIN  8.0 01/09/2022   Lab Results  Component Value Date   TSH 2.700 03/26/2023   CBC    Component Value Date/Time   WBC 5.4 03/26/2023 0734   WBC 5.8 07/10/2021 0912   RBC 4.49 03/26/2023 0734   RBC 4.59 07/10/2021 0912   HGB 14.7 03/26/2023 0734   HCT 44.6 03/26/2023 0734   PLT 207 03/26/2023 0734   MCV 99 (H) 03/26/2023 0734   MCH 32.7 03/26/2023 0734   MCH 32.5 07/10/2021 0912   MCHC 33.0 03/26/2023 0734   MCHC 33.9 07/10/2021 0912   RDW 11.6 (L) 03/26/2023 0734   Iron Studies  No results found for: "IRON", "TIBC", "FERRITIN", "IRONPCTSAT" Lipid Panel     Component Value Date/Time   CHOL 145 03/26/2023 0734   TRIG 79 03/26/2023 0734   HDL 52 03/26/2023 0734   CHOLHDL 2.6 12/26/2021 1158   LDLCALC 78 03/26/2023 0734   LDLCALC 72 12/26/2021 1158   Hepatic Function Panel     Component Value Date/Time   PROT 6.2 03/26/2023 0734   ALBUMIN 4.0 03/26/2023 0734   AST 14 03/26/2023 0734   ALT 11 03/26/2023 0734   ALKPHOS 64 03/26/2023 0734   BILITOT 0.5 03/26/2023 0734   BILIDIR 0.1 08/17/2019 0902   IBILI 0.5 08/17/2019 0902      Component Value Date/Time   TSH 2.700 03/26/2023 0734   Nutritional Lab Results  Component Value Date    VD25OH 67.2 08/06/2023   VD25OH 66.3 03/26/2023   VD25OH 51.3 09/25/2022     Assessment and Plan Assessment & Plan Obesity Obesity management with dietary and exercise interventions. She follows a 1500 calorie diet with increased protein intake and engages in walking and resistance training twice weekly. Weight has been maintained over the last month. Currently on Zepbound 2.5 mg weekly, experiencing reduced efficacy towards the end of the week. No significant gastrointestinal side effects reported. Decision made to increase Zepbound dose due to reduced efficacy and low initial dose. - Increase Zepbound to 5 mg weekly. Double up on current supply if available. - Continue current dietary plan with emphasis on protein intake. - Encourage continuation of exercise regimen.  Prediabetes Prediabetes managed with metformin . She reports taking metformin  5 out of 7 days, which is considered satisfactory. Due for lab work to monitor condition. - Order CMP, hemoglobin A1c, and insulin  levels. - Continue current dietary plan with emphasis on protein intake. - Encourage continuation of exercise regimen.  Hyperlipidemia Hyperlipidemia managed with dietary modifications and Crestor  5 mg. She requests a refill of Crestor . Due for fasting lipid profile to assess current status. - Refill Crestor  5 mg. - Order fasting lipid profile. - Continue current dietary plan with emphasis on protein intake. - Encourage continuation of exercise regimen.  Hypothyroidism Hypothyroidism managed with Synthroid  75 mcg. Due for lab work to assess current thyroid  function and adjust medication if necessary. Additional thyroid  tests ordered to ensure optimal treatment as weight changes may affect thyroid  hormone requirements. - Order TSH, free T3, and T4 levels.  Vitamin D  deficiency Vitamin D  deficiency managed with prescription vitamin D  50,000 IU weekly. Last levels were at goal. Due for lab work to ensure levels remain  at goal and avoid over-replacement. - Order vitamin D  levels.  B12 deficiency B12 deficiency managed with a B12-rich diet. Due for lab work to assess current B12 levels. - Order B12 levels.      She was informed of the importance of frequent follow up visits to maximize her success with intensive lifestyle modifications for her multiple health conditions.    Jasmine Mesi, MD

## 2023-10-16 ENCOUNTER — Telehealth: Payer: Self-pay | Admitting: *Deleted

## 2023-10-16 ENCOUNTER — Encounter: Payer: Self-pay | Admitting: *Deleted

## 2023-10-16 DIAGNOSIS — Z1211 Encounter for screening for malignant neoplasm of colon: Secondary | ICD-10-CM

## 2023-10-16 LAB — CMP14+EGFR
ALT: 9 IU/L (ref 0–32)
AST: 12 IU/L (ref 0–40)
Albumin: 3.9 g/dL (ref 3.9–4.9)
Alkaline Phosphatase: 64 IU/L (ref 44–121)
BUN/Creatinine Ratio: 17 (ref 9–23)
BUN: 15 mg/dL (ref 6–24)
Bilirubin Total: 0.5 mg/dL (ref 0.0–1.2)
CO2: 19 mmol/L — ABNORMAL LOW (ref 20–29)
Calcium: 9.3 mg/dL (ref 8.7–10.2)
Chloride: 104 mmol/L (ref 96–106)
Creatinine, Ser: 0.9 mg/dL (ref 0.57–1.00)
Globulin, Total: 2.2 g/dL (ref 1.5–4.5)
Glucose: 87 mg/dL (ref 70–99)
Potassium: 4.4 mmol/L (ref 3.5–5.2)
Sodium: 139 mmol/L (ref 134–144)
Total Protein: 6.1 g/dL (ref 6.0–8.5)
eGFR: 81 mL/min/{1.73_m2} (ref >59)

## 2023-10-16 LAB — CBC WITH DIFFERENTIAL/PLATELET
Basophils Absolute: 0 10*3/uL (ref 0.0–0.2)
Basos: 1 %
EOS (ABSOLUTE): 0 10*3/uL (ref 0.0–0.4)
Eos: 1 %
Hematocrit: 43.6 % (ref 34.0–46.6)
Hemoglobin: 15 g/dL (ref 11.1–15.9)
Immature Grans (Abs): 0 10*3/uL (ref 0.0–0.1)
Immature Granulocytes: 0 %
Lymphocytes Absolute: 1.4 10*3/uL (ref 0.7–3.1)
Lymphs: 29 %
MCH: 33.4 pg — ABNORMAL HIGH (ref 26.6–33.0)
MCHC: 34.4 g/dL (ref 31.5–35.7)
MCV: 97 fL (ref 79–97)
Monocytes Absolute: 0.4 10*3/uL (ref 0.1–0.9)
Monocytes: 8 %
Neutrophils Absolute: 3.1 10*3/uL (ref 1.4–7.0)
Neutrophils: 61 %
Platelets: 262 10*3/uL (ref 150–450)
RBC: 4.49 x10E6/uL (ref 3.77–5.28)
RDW: 11.7 % (ref 11.7–15.4)
WBC: 4.9 10*3/uL (ref 3.4–10.8)

## 2023-10-16 LAB — LIPID PANEL WITH LDL/HDL RATIO
Cholesterol, Total: 189 mg/dL (ref 100–199)
HDL: 55 mg/dL (ref >39)
LDL Chol Calc (NIH): 121 mg/dL — ABNORMAL HIGH (ref 0–99)
LDL/HDL Ratio: 2.2 ratio (ref 0.0–3.2)
Triglycerides: 70 mg/dL (ref 0–149)
VLDL Cholesterol Cal: 13 mg/dL (ref 5–40)

## 2023-10-16 LAB — INSULIN, RANDOM: INSULIN: 14 u[IU]/mL (ref 2.6–24.9)

## 2023-10-16 LAB — HEMOGLOBIN A1C
Est. average glucose Bld gHb Est-mCnc: 97 mg/dL
Hgb A1c MFr Bld: 5 % (ref 4.8–5.6)

## 2023-10-16 LAB — T3: T3, Total: 97 ng/dL (ref 71–180)

## 2023-10-16 LAB — PARATHYROID HORMONE, INTACT (NO CA): PTH: 33 pg/mL (ref 15–65)

## 2023-10-16 LAB — T4, FREE: Free T4: 1.77 ng/dL (ref 0.82–1.77)

## 2023-10-16 LAB — VITAMIN B12: Vitamin B-12: >2000 pg/mL — ABNORMAL HIGH (ref 232–1245)

## 2023-10-16 LAB — VITAMIN D 25 HYDROXY (VIT D DEFICIENCY, FRACTURES): Vit D, 25-Hydroxy: 57.8 ng/mL (ref 30.0–100.0)

## 2023-10-16 MED ORDER — NA SULFATE-K SULFATE-MG SULF 17.5-3.13-1.6 GM/177ML PO SOLN: 1.0000 | Freq: Once | ORAL | 0 refills | Status: DC

## 2023-10-16 NOTE — Telephone Encounter (Signed)
 Scheduled patient per Dr Leonia Raman a screening colonoscopy for Dr Joyce Nixon on 12/11/2023 at 12:30 pm  Discussed date and times with patient   Sent Dr Andy Bannister a message to ask where to send her prep to. Several pharmacy's listed   Patient wants instructions mailed to her, will send out today

## 2023-10-16 NOTE — Telephone Encounter (Signed)
  [name], MD Nov 20, 1980 [id number]  10/16/2023   Dear Dr Coni Deep, :  We have scheduled the above named patient for a(n) Colonoscopy procedure. Our records show that she  is on anticoagulation therapy.  Please advise as to whether the patient may come off their therapy of Eliquis  2  days prior to their procedure which is scheduled for 12/11/2023.  Please route your response to Royanne Core, CMA or fax response to [phone].  Sincerely,    [facility]

## 2023-10-17 MED ORDER — NA SULFATE-K SULFATE-MG SULF 17.5-3.13-1.6 GM/177ML PO SOLN: 1.0000 | Freq: Once | ORAL | 0 refills | Status: AC

## 2023-10-17 NOTE — Addendum Note (Signed)
 Addended by: Chrisandra Counts on: 10/17/2023 08:28 AM   Modules accepted: Orders

## 2023-10-23 ENCOUNTER — Other Ambulatory Visit (INDEPENDENT_AMBULATORY_CARE_PROVIDER_SITE_OTHER): Payer: Self-pay | Admitting: Family Medicine

## 2023-10-23 DIAGNOSIS — E88819 Insulin resistance, unspecified: Secondary | ICD-10-CM

## 2023-11-12 ENCOUNTER — Encounter: Payer: Self-pay | Admitting: *Deleted

## 2023-11-12 ENCOUNTER — Ambulatory Visit (INDEPENDENT_AMBULATORY_CARE_PROVIDER_SITE_OTHER): Admitting: Family Medicine

## 2023-11-12 ENCOUNTER — Encounter (INDEPENDENT_AMBULATORY_CARE_PROVIDER_SITE_OTHER): Payer: Self-pay | Admitting: Family Medicine

## 2023-11-12 VITALS — BP 101/71 | HR 74 | Temp 98.1°F | Ht 67.0 in | Wt 249.0 lb

## 2023-11-12 DIAGNOSIS — E669 Obesity, unspecified: Secondary | ICD-10-CM

## 2023-11-12 DIAGNOSIS — Z6839 Body mass index (BMI) 39.0-39.9, adult: Secondary | ICD-10-CM

## 2023-11-12 DIAGNOSIS — E559 Vitamin D deficiency, unspecified: Secondary | ICD-10-CM | POA: Diagnosis not present

## 2023-11-12 DIAGNOSIS — E7800 Pure hypercholesterolemia, unspecified: Secondary | ICD-10-CM | POA: Diagnosis not present

## 2023-11-12 DIAGNOSIS — E88819 Insulin resistance, unspecified: Secondary | ICD-10-CM

## 2023-11-12 DIAGNOSIS — R632 Polyphagia: Secondary | ICD-10-CM | POA: Diagnosis not present

## 2023-11-12 DIAGNOSIS — F3289 Other specified depressive episodes: Secondary | ICD-10-CM

## 2023-11-12 DIAGNOSIS — Z6841 Body Mass Index (BMI) 40.0 and over, adult: Secondary | ICD-10-CM

## 2023-11-12 DIAGNOSIS — F5089 Other specified eating disorder: Secondary | ICD-10-CM

## 2023-11-12 MED ORDER — ROSUVASTATIN CALCIUM 5 MG PO TABS
5.0000 mg | ORAL_TABLET | Freq: Every day | ORAL | 0 refills | Status: DC
Start: 2023-11-12 — End: 2024-02-11

## 2023-11-12 MED ORDER — BUPROPION HCL ER (SR) 150 MG PO TB12
150.0000 mg | ORAL_TABLET | Freq: Two times a day (BID) | ORAL | 0 refills | Status: DC
Start: 2023-11-12 — End: 2024-02-11

## 2023-11-12 MED ORDER — TIRZEPATIDE-WEIGHT MANAGEMENT 5 MG/0.5ML ~~LOC~~ SOLN: 5.0000 mg | SUBCUTANEOUS | 2 refills | Status: DC

## 2023-11-12 MED ORDER — VITAMIN D (ERGOCALCIFEROL) 1.25 MG (50000 UNIT) PO CAPS: 50000.0000 [IU] | ORAL_CAPSULE | ORAL | 0 refills | Status: DC

## 2023-11-12 MED ORDER — METFORMIN HCL 500 MG PO TABS: 500.0000 mg | ORAL_TABLET | Freq: Two times a day (BID) | ORAL | 0 refills | Status: DC

## 2023-11-12 NOTE — Progress Notes (Signed)
 Office: [phone]  /  Fax: [phone]  WEIGHT SUMMARY AND BIOMETRICS  Anthropometric Measurements Height: 5\' 7"  (1.702 m) Weight: 249 lb (112.9 kg) BMI (Calculated): 38.99 Weight at Last Visit: 255 lb Weight Lost Since Last Visit: 6 lb Weight Gained Since Last Visit: 0 Starting Weight: 300 lb Total Weight Loss (lbs): 51 lb (23.1 kg) Peak Weight: 320 lb   Body Composition  Body Fat %: 48 % Fat Mass (lbs): 119.6 lbs Muscle Mass (lbs): 122.8 lbs Total Body Water (lbs): 89 lbs Visceral Fat Rating : 13   Other Clinical Data Fasting: no Labs: no Today's Visit #: 26 Starting Date: 01/09/22    Chief Complaint: OBESITY    History of Present Illness Penny Finn, 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  She is adhering to a prescribed 1500 calorie diet with at least 85 grams of protein, maintaining this regimen 80-85% of the time. She has incorporated resistance band exercises, performing 30 minutes one to two times per week, resulting in a six-pound weight loss over the past month. She aims for a 500 calorie deficit daily and targets 60 grams of protein by noon and 90-110 grams daily. She experiences occasional hypoglycemia if she delays eating past 10 or 11 AM.  She has hyperlipidemia and takes Crestor  5 mg daily. She is also making dietary changes to lower her cholesterol.  She is on bupropion  SR 150 mg twice a day for emotional eating behaviors and reports stability in this area. She employs strategies to avoid temptations related to emotional eating.  She has a history of vitamin D  deficiency and takes prescription vitamin D  50,000 IU weekly.  She has insulin  resistance and is on metformin , working on reducing simple carbohydrates in her diet.  She has a history of polyphagia and is treated with Zepbound 5 mg, focusing on increasing protein intake.      PHYSICAL EXAM:  Blood pressure  101/71, pulse 74, temperature 98.1 F (36.7 C), height 5\' 7"  (1.702 m), weight 249 lb (112.9 kg), last menstrual period 09/30/2023, SpO2 99%. Body mass index is 39 kg/m.  DIAGNOSTIC DATA REVIEWED:  BMET    Component Value Date/Time   NA 139 10/15/2023 0749   K 4.4 10/15/2023 0749   CL 104 10/15/2023 0749   CO2 19 (L) 10/15/2023 0749   GLUCOSE 87 10/15/2023 0749   GLUCOSE 108 (H) 07/10/2021 0912   BUN 15 10/15/2023 0749   CREATININE 0.90 10/15/2023 0749   CREATININE 0.85 07/10/2021 0912   CALCIUM  9.3 10/15/2023 0749   GFRNONAA 86 04/30/2019 0904   GFRAA 99 04/30/2019 0904   Lab Results  Component Value Date   HGBA1C 5.0 10/15/2023   HGBA1C 5.1 04/30/2019   Lab Results  Component Value Date   INSULIN  14.0 10/15/2023   INSULIN  8.0 01/09/2022   Lab Results  Component Value Date   TSH 2.700 03/26/2023   CBC    Component Value Date/Time   WBC 4.9 10/15/2023 0749   WBC 5.8 07/10/2021 0912   RBC 4.49 10/15/2023 0749   RBC 4.59 07/10/2021 0912   HGB 15.0 10/15/2023 0749   HCT 43.6 10/15/2023 0749   PLT 262 10/15/2023 0749   MCV 97 10/15/2023 0749   MCH 33.4 (H) 10/15/2023 0749   MCH 32.5 07/10/2021 0912   MCHC 34.4 10/15/2023 0749   MCHC 33.9 07/10/2021 0912   RDW 11.7 10/15/2023 0749   Iron Studies  No results found for: "IRON", "TIBC", "FERRITIN", "IRONPCTSAT" Lipid Panel     Component Value Date/Time   CHOL 189 10/15/2023 0749   TRIG 70 10/15/2023 0749   HDL 55 10/15/2023 0749   CHOLHDL 2.6 12/26/2021 1158   LDLCALC 121 (H) 10/15/2023 0749   LDLCALC 72 12/26/2021 1158   Hepatic Function Panel     Component Value Date/Time   PROT 6.1 10/15/2023 0749   ALBUMIN 3.9 10/15/2023 0749   AST 12 10/15/2023 0749   ALT 9 10/15/2023 0749   ALKPHOS 64 10/15/2023 0749   BILITOT 0.5 10/15/2023 0749   BILIDIR 0.1 08/17/2019 0902   IBILI 0.5 08/17/2019 0902      Component Value Date/Time   TSH 2.700 03/26/2023 0734   Nutritional Lab Results  Component Value  Date   VD25OH 57.8 10/15/2023   VD25OH 67.2 08/06/2023   VD25OH 66.3 03/26/2023     Assessment and Plan Assessment & Plan Obesity Obesity management focuses on a dietary caloric deficit and increased protein intake. She adheres to a 1500 calorie diet with 85 grams of protein 80-85% of the time, incorporates resistance band exercises 1-2 times per week, and has lost 6 pounds in the last month. She is mindful of emotional eating behaviors and uses bupropion  SR for management. She experiments with maintaining a 500 calorie deficit daily, adjusting based on exercise and caloric intake. - Continue 1500 calorie diet with 85 grams of protein daily. - Continue resistance band exercises 1-2 times per week.  Emotional eating behaviors Emotional eating is managed with bupropion  SR and mindfulness strategies to avoid triggers. - Refill bupropion  SR 150 mg twice daily. - Continue mindfulness strategies to manage emotional eating. - Refill bupropion  SR 150 mg twice daily.  Polyphagia Polyphagia is managed with Zepbound 5 mg and increased protein intake. The medication reduces hunger and snacking, and consuming carbohydrates before protein enhances fullness. - Refill Zepbound 5 mg. - Continue increasing protein intake.  Insulin  resistance Insulin  resistance is managed with dietary changes, exercise, weight loss, and metformin . She actively reduces simple carbohydrate intake. - Refill metformin . - Continue dietary modifications to reduce simple carbohydrates.  Pure hyperlipidemia Hyperlipidemia is managed with Crestor  5 mg and dietary modifications to reduce cholesterol intake. - Refill Crestor  5 mg. - Continue dietary modifications to reduce cholesterol.  Vitamin D  deficiency Vitamin D  deficiency is managed with prescription vitamin D  50,000 IU weekly. She is not yet at goal. - Refill vitamin D  50,000 IU weekly.    She was informed of the importance of frequent follow up visits to maximize  her success with intensive lifestyle modifications for her multiple health conditions.    Jasmine Mesi, MD

## 2023-11-13 NOTE — Telephone Encounter (Signed)
 Sumner Ends, MD sent to Chrisandra Counts, CMA Yes

## 2023-11-18 NOTE — Telephone Encounter (Signed)
 Left message on patients cell phone , Ok to hold Eliquis  2 days before her procedure per Dr Scherrie Curt    Told patient to contact me if needed

## 2023-11-26 ENCOUNTER — Encounter: Payer: Self-pay | Admitting: Internal Medicine

## 2023-11-26 ENCOUNTER — Ambulatory Visit: Payer: BC Managed Care – PPO | Admitting: Internal Medicine

## 2023-11-26 VITALS — BP 118/70 | HR 68 | Ht 67.0 in | Wt 254.8 lb

## 2023-11-26 DIAGNOSIS — C73 Malignant neoplasm of thyroid gland: Secondary | ICD-10-CM

## 2023-11-26 DIAGNOSIS — E890 Postprocedural hypothyroidism: Secondary | ICD-10-CM

## 2023-11-26 LAB — TSH: TSH: 1.32 m[IU]/L

## 2023-11-26 LAB — T4, FREE: Free T4: 1.6 ng/dL (ref 0.8–1.8)

## 2023-11-26 NOTE — Progress Notes (Signed)
 Patient ID: Penny Finn, MD, female   DOB: [birth year], 43 y.o.   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 Sofia Dunn, returning for follow-up for thyroid  cancer and postsurgical hypothyroidism.  Last visit 1 year ago.  Interim history: She has no complaints at today's visit. Before last visit, she lost a significant amount of weight: 40 pounds.  She lost 9 more pounds since then.  She is seen in the weight management clinic.  She is on Mounjaro  (vials from [facility]) and metformin .  Reviewed and addended history: Pt. has been dx with papillary thyroid  cancer in 07/2018.   Patient was found to have a right thyroid  nodule in [employer] (>1 cm).  FNA (2003): colloid  Chest CT angiogram (11/18/2017) performed during investigation for PE (which was positive): Approximately 1.5 cm low-attenuation nodule within the inferior aspect of the right thyroid  gland with peripheral dystrophic calcifications. No evidence of mediastinal or hilar adenopathy.  She then had a thyroid  ultrasound (04/09/2018): Location: Right; Inferior Maximum size: 1.8 x 1.7 x 1.5 cm Composition: solid/almost completely solid (2) Echogenicity: isoechoic (1) Echogenic foci: peripheral calcifications (2)  FNA of this nodule (05/08/2018): THYROID , FINE NEEDLE ASPIRATION, RLP (SPECIMEN 1 OF 1, COLLECTED 05/08/18): SUSPICIOUS FOR MALIGNANCY, SEE COMMENT (BETHESDA CATEGORY V).  She had right lobectomy + isthmusectomy (07/24/2018) by Dr. Sofia Dunn: Thyroid , lobectomy, right and isthmus - PAPILLARY CARCINOMA, 1.8 CM IN GREATEST DIMENSION. - NO EXTRATHYROIDAL EXTENSION IDENTIFIED. - MARGINS OF RESECTION ARE NOT INVOLVED. - ONE LYMPH NODE, NEGATIVE FOR CARCINOMA (0/1). - SEE ONCOLOGY TABLE. Microscopic Comment THYROID  GLAND: Procedure: Right lobectomy and isthmusectomy Tumor Focality: Unifocal Tumor Site: Right lobe Tumor Size: Greatest dimension: 1.8  cm Histologic Type: Papillary carcinoma, classic (usual, conventional) Margins: Uninvolved by carcinoma Angioinvasion: Not identified Lymphatic Invasion: Not identified Extrathyroidal Extension: Not identified Regional Lymph Nodes: Number of Lymph Nodes Involved: 0 Nodal Levels Involved: Not applicable Size of Largest Metastatic Deposit: Not applicable Extranodal Extension (ENE): Not applicable Number of Lymph Nodes Examined: 1 Nodal Levels Examined: Incidental. The lymph node is present in the soft tissue surrounding the thyroid  parenchyma. Pathologic Stage Classification (pTNM, AJCC 8th Edition): pT1b, pN0 Comment: Intradepartmental consultation was obtained (Dr. Secundino Dach). r Thyroid  U/S (11/03/2019): Parenchymal Echotexture: Moderately heterogenous Isthmus: 0.2 cm Right lobe: Surgically absent Left lobe: 4.3 x 1.1 x 1.6 cm  ____________________________________________________________________   Surgical changes of right hemithyroidectomy. No residual or recurrent thyroid  tissue or nodularity in the thyroid  resection bed. The residual isthmus and left thyroid  gland are heterogeneous in appearance. Small subcentimeter cysts and hypoechoic nodules noted incidentally on the left. No suspicious features. These nodules do not meet criteria for biopsy or further imaging evaluation.   IMPRESSION: 1. Surgical changes of prior right hemithyroidectomy without evidence of residual or recurrent thyroid  tissue. 2. Heterogeneous residual thyroid  isthmus and left thyroid  gland containing small cysts and nodules which do not meet criteria for further evaluation.oss   Thyroid  U/S (11/20/2022): 24): Parenchymal Echotexture: Moderately heterogenous  Isthmus: 0.2 cm  Right lobe: Surgically absent  Left lobe: 4.3 x 1.3 x 1.5 cm  _________________________________________________________   Estimated total number of nodules >/= 1 cm: 0 _________________________________________________________    No sonographic evidence of residual or recurrent thyroid  tissue within the RIGHT thyroidectomy bed.   Solitary LEFT mid sub-1 cm (0.9 cm) mixed cystic and solid nodule does not meet threshold for follow-up nor biopsy per current criteria.   No cervical adenopathy or abnormal  fluid collection within the imaged neck.   IMPRESSION: 1. No residual or recurrent thyroid  tissue within RIGHT thyroidectomy bed. 2. Solitary LEFT mid sub-1 cm mixed cystic and solid nodule does not meet threshold for follow-up nor biopsy per current criteria. Side port side port and Lab Results  Component Value Date   THYROGLB 17.7 11/18/2018   Lab Results  Component Value Date   THGAB 1 11/18/2018  cal Information Postsurgical hypothyroidism  Pt is on levothyroxine  75 mcg daily: - in am - fasting - at least 30-60 min from coffee and cream, breakfast - no Ca, Fe, MVI, PPIs - occasionally taking biotin 5000 mcg daily - off now x 1 mo. On Vitamin D .  Reviewed her TFTs -last set of labs was obtained on the above dose: Lab Results  Component Value Date   TSH 2.700 03/26/2023   TSH 1.54 12/25/2022   TSH 2.080 09/25/2022   TSH 1.27 03/20/2022   TSH 1.710 01/09/2022   TSH 2.200 11/10/2021   TSH 2.07 07/10/2021   TSH 1.45 10/25/2020   TSH 1.85 10/20/2019   TSH 1.73 04/30/2019   FREET4 1.77 10/15/2023   FREET4 1.15 12/25/2022   FREET4 1.16 03/20/2022   FREET4 1.56 01/09/2022   FREET4 1.54 11/10/2021   FREET4 1.17 10/25/2020   FREET4 1.20 11/18/2018  09/2018: TSH ~1 08/2018: TSH ~4  Pt denies: - feeling nodules in neck - hoarseness - dysphagia - choking  She has + FH of thyroid  disorders in: mother, PGM. No FH of thyroid  cancer. No h/o radiation tx to head or neck. No herbal supplements. No Biotin use. No recent steroids use.   She was on the Mirena  IUD since 2020 >> now off.  ROS: + See HPI  I reviewed pt's medications, allergies, PMH, social hx, family hx, and changes were  documented in the history of present illness. Otherwise, unchanged from my initial visit note.  Past Medical History:  Diagnosis Date   Back pain    GERD (gastroesophageal reflux disease)    resolved    H/O blood clots    High cholesterol    Hypertension    Hypothyroidism    Joint pain    PONV (postoperative nausea and vomiting)    mild nausea responsive to meds    Pulmonary embolism (HCC) 10/2017   Thyroid  carcinoma (HCC)    Vitamin D  deficiency    Past Surgical History:  Procedure Laterality Date   broken wrist     KNEE SURGERY     arthroscopy    THYROIDECTOMY Right 07/24/2018   Procedure: RIGHT THYROID  LOBECTOMY  AND ISTHMUS;  Surgeon: Oralee Billow, MD;  Location: [facility];  Service: General;  Laterality: Right;   Social History   Socioeconomic History   Marital status: Single    Spouse name: Not on file   Number of children: Not on file   Years of education: Not on file   Highest education level: Doctorate  Occupational History   Not on file  Tobacco Use   Smoking status: Never   Smokeless tobacco: Never  Vaping Use   Vaping status: Never Used  Substance and Sexual Activity   Alcohol use: Yes    Comment: occasional   Drug use: Never   Sexual activity: Yes    Birth control/protection: I.U.D.    Comment: Mirena  placed 11/04/18  Other Topics Concern   Not on file  Social History Narrative   Social history: She is single.  Social alcohol consumption.  Has  steady female partner.  Non-smoker.       Family history: Parents and twin brothers in good health   Social Drivers of Health   Financial Resource Strain: Low Risk  (04/11/2023)   Overall Financial Resource Strain (CARDIA)    Difficulty of Paying Living Expenses: Not hard at all  Food Insecurity: No Food Insecurity (04/11/2023)   Hunger Vital Sign    Worried About Running Out of Food in the Last Year: Never true    Ran Out of Food in the Last Year: Never true  Transportation Needs: No Transportation Needs  (04/11/2023)   PRAPARE - Administrator, Civil Service (Medical): No    Lack of Transportation (Non-Medical): No  Physical Activity: Insufficiently Active (04/11/2023)   Exercise Vital Sign    Days of Exercise per Week: 3 days    Minutes of Exercise per Session: 20 min  Stress: Stress Concern Present (04/11/2023)   [employer] of Occupational Health - Occupational Stress Questionnaire    Feeling of Stress : Rather much  Social Connections: Moderately Isolated (04/11/2023)   Social Connection and Isolation Panel [NHANES]    Frequency of Communication with Friends and Family: Twice a week    Frequency of Social Gatherings with Friends and Family: Once a week    Attends Religious Services: 1 to 4 times per year    Active Member of [employer] or Organizations: No    Attends Engineer, structural: Not on file    Marital Status: Never married  Intimate Partner Violence: Not on file   Current Outpatient Medications on File Prior to Visit  Medication Sig Dispense Refill   apixaban  (ELIQUIS ) 5 MG TABS tablet Take 1 tablet (5 mg total) by mouth 2 (two) times daily. 180 tablet 3   buPROPion  (WELLBUTRIN  SR) 150 MG 12 hr tablet Take 1 tablet (150 mg total) by mouth 2 (two) times daily. 180 tablet 0   levonorgestrel  (MIRENA ) 20 MCG/24HR IUD 1 each by Intrauterine route once. Placed 11/14/18     levothyroxine  (SYNTHROID ) 75 MCG tablet Take by mouth 1 tablet daily 90 tablet 3   metFORMIN  (GLUCOPHAGE ) 500 MG tablet Take 1 tablet (500 mg total) by mouth 2 (two) times daily with a meal. 180 tablet 0   rosuvastatin  (CRESTOR ) 5 MG tablet Take 1 tablet (5 mg total) by mouth daily. 90 tablet 0   tirzepatide  5 MG/0.5ML injection vial Inject 5 mg into the skin once a week. 2 mL 2   Vitamin D , Ergocalciferol , (DRISDOL ) 1.25 MG (50000 UNIT) CAPS capsule Take 1 capsule (50,000 Units total) by mouth once a week. 13 capsule 0   No current facility-administered medications on file prior to visit.    No Known Allergies Family History  Problem Relation Age of Onset   Thyroid  disease Mother    Heart disease Paternal Grandmother    Heart disease Paternal Grandfather    Breast cancer Maternal Aunt 50   PE: BP 118/70   Pulse 68   Ht 5\' 7"  (1.702 m)   Wt 254 lb 12.8 oz (115.6 kg)   SpO2 98%   BMI 39.91 kg/m  Wt Readings from Last 20 Encounters:  11/26/23 254 lb 12.8 oz (115.6 kg)  11/12/23 249 lb (112.9 kg)  10/15/23 255 lb (115.7 kg)  09/10/23 255 lb (115.7 kg)  08/06/23 261 lb (118.4 kg)  07/15/23 260 lb 9.6 oz (118.2 kg)  06/04/23 254 lb (115.2 kg)  05/07/23 253 lb (114.8 kg)  04/04/23  261 lb 3.2 oz (118.5 kg)  04/03/23 254 lb (115.2 kg)  03/05/23 257 lb (116.6 kg)  01/22/23 258 lb (117 kg)  12/25/22 256 lb (116.1 kg)  11/27/22 255 lb (115.7 kg)  11/13/22 263 lb 6.4 oz (119.5 kg)  10/30/22 262 lb (118.8 kg)  10/09/22 262 lb (118.8 kg)  09/11/22 263 lb (119.3 kg)  08/14/22 266 lb (120.7 kg)  07/17/22 272 lb (123.4 kg)   Constitutional: overweight, in NAD Eyes: EOMI, no exophthalmos ENT: no thyroid  masses palpated, thyroidectomy scar healed, no cervical lymphadenopathy Cardiovascular: RRR, No MRG Respiratory: CTA B Musculoskeletal: no deformities Skin: no rashes Neurological: no tremor with outstretched hands  ASSESSMENT: 1. Thyroid  cancer - see HPI  2. Postsurgical Hypothyroidism  PLAN:  1. Thyroid  cancer -papillary -Patient has stage I TNM thyroid  cancer by pathology.  This is a slow-growing cancer, usually with good prognosis -Her tumor was larger than 1.5 cm, however, it did not have aggressive features so I did not recommend completion thyroidectomy + RAI treatment. -She had a thyroid  ultrasound in 10/2019 and this did not show any concerning masses.  She had heterogeneous residual thyroid  isthmus and left gland left in place, which appeared to be low risk and for which no further investigation was recommended.  However, we checked another thyroid   ultrasound 11/20/2022 and this showed a subcentimeter nodule in the left lobe, not worrisome.  No signs of cancer recurrence. -At today's visit she does not have neck compression symptoms or masses felt on palpation of her neck -We are not following her by thyroglobulin since these are not indicated in the setting of hemithyroidectomy due to the fact that they are too fluctuating and unreliable -Will continue to follow her clinically for now  2. Patient with history of partial thyroidectomy for thyroid  cancer now with iatrogenic hypothyroidism - latest thyroid  labs reviewed with pt. >> normal: Lab Results  Component Value Date   TSH 2.700 03/26/2023  - she continues on LT4 75 mcg daily - pt feels good on this dose.  We previously had her on tablets without dyes, but this did not help her anxiety so she is now on the 75 mcg tablets. - we discussed about taking the thyroid  hormone every day, with water, >30 minutes before breakfast, separated by >4 hours from acid reflux medications, calcium , iron, multivitamins. Pt. is taking it correctly. - will check thyroid  tests today: TSH and fT4 - If labs are abnormal, she will need to return for repeat TFTs in 1.5 months - RTC in 1 year  Orders Placed This Encounter  Procedures   TSH   T4, free   Needs refills.  Emilie Harden, MD PhD [facility] Endocrinology

## 2023-11-26 NOTE — Patient Instructions (Signed)
Please stop at the lab.  Please continue levothyroxine 75 mcg daily.  Take the thyroid hormone every day, with water, at least 30 minutes before breakfast, separated by at least 4 hours from: - acid reflux medications - calcium - iron - multivitamins  Please come back for a follow-up appointment in 1 year. 

## 2023-11-27 ENCOUNTER — Ambulatory Visit: Payer: Self-pay | Admitting: Internal Medicine

## 2023-11-27 DIAGNOSIS — E890 Postprocedural hypothyroidism: Secondary | ICD-10-CM

## 2023-11-27 MED ORDER — LEVOTHYROXINE SODIUM 75 MCG PO TABS: ORAL_TABLET | ORAL | 3 refills | Status: AC

## 2023-11-27 MED ORDER — LEVOTHYROXINE SODIUM 75 MCG PO TABS
ORAL_TABLET | ORAL | 3 refills | Status: DC
Start: 2023-11-27 — End: 2023-11-27

## 2023-11-27 NOTE — Addendum Note (Signed)
 Addended by: Emilie Harden on: 11/27/2023 12:56 PM   Modules accepted: Orders

## 2023-12-04 ENCOUNTER — Encounter: Payer: Self-pay | Admitting: Gastroenterology

## 2023-12-10 ENCOUNTER — Ambulatory Visit (INDEPENDENT_AMBULATORY_CARE_PROVIDER_SITE_OTHER): Admitting: Adult Health

## 2023-12-10 ENCOUNTER — Encounter (INDEPENDENT_AMBULATORY_CARE_PROVIDER_SITE_OTHER): Payer: Self-pay | Admitting: Adult Health

## 2023-12-10 VITALS — BP 108/74 | HR 78 | Temp 98.3°F | Ht 67.0 in | Wt 247.0 lb

## 2023-12-10 DIAGNOSIS — R632 Polyphagia: Secondary | ICD-10-CM | POA: Diagnosis not present

## 2023-12-10 DIAGNOSIS — E7800 Pure hypercholesterolemia, unspecified: Secondary | ICD-10-CM

## 2023-12-10 DIAGNOSIS — E88819 Insulin resistance, unspecified: Secondary | ICD-10-CM

## 2023-12-10 DIAGNOSIS — R7303 Prediabetes: Secondary | ICD-10-CM | POA: Diagnosis not present

## 2023-12-10 DIAGNOSIS — E559 Vitamin D deficiency, unspecified: Secondary | ICD-10-CM

## 2023-12-10 DIAGNOSIS — F3289 Other specified depressive episodes: Secondary | ICD-10-CM

## 2023-12-10 DIAGNOSIS — E538 Deficiency of other specified B group vitamins: Secondary | ICD-10-CM | POA: Diagnosis not present

## 2023-12-10 DIAGNOSIS — Z6841 Body Mass Index (BMI) 40.0 and over, adult: Secondary | ICD-10-CM

## 2023-12-10 DIAGNOSIS — Z6839 Body mass index (BMI) 39.0-39.9, adult: Secondary | ICD-10-CM

## 2023-12-10 DIAGNOSIS — E669 Obesity, unspecified: Secondary | ICD-10-CM

## 2023-12-10 MED ORDER — TIRZEPATIDE-WEIGHT MANAGEMENT 5 MG/0.5ML ~~LOC~~ SOLN
5.0000 mg | SUBCUTANEOUS | 2 refills | Status: DC
Start: 2023-12-10 — End: 2024-01-16

## 2023-12-10 NOTE — Progress Notes (Signed)
 WEIGHT SUMMARY AND BIOMETRICS  No data recorded Anthropometric Measurements Height: 5' 7 (1.702 m) Weight at Last Visit: 249lb Starting Weight: 300lb Peak Weight: 320lb   No data recorded Other Clinical Data Fasting: No Labs: No Today's Visit #: 63 Starting Date: 01/09/22    Chief Complaint:   OBESITY Penny Hancock is here to discuss her progress with her obesity treatment plan.  She is on the keeping a food journal and adhering to recommended goals of 1500 calories and 85g+ protein and states she is following her eating plan approximately 100 % of the time.  She states she is exercising Walking/Banded Exercises 20 minutes 3 times per week.  Interim History:  10/15/2023 Zepbound  increased from 2.5mg  to 5 mg Denies mass in neck, dysphagia, dyspepsia, persistent hoarseness, abdominal pain, or N/V/C  She has been consistently tracking intake and easily able to keep max daily calories 1500 or less.  She has demanding surgical schedule and is trying to incorporate more activity/exercise into her daily routine.  She and her partner will travel to [place], [place] for a long weekend in July.  Of note-  Birth Control-her partner has had vasectomy   Subjective:   1. Vitamin D  deficiency Discussed Labs  Latest Reference Range & Units 10/15/23 07:49  Vitamin D , 25-Hydroxy 30.0 - 100.0 ng/mL 57.8   Vit D Level stable and at goal She is on weekly Ergocalciferol   2. Prediabetes Discussed Labs  Latest Reference Range & Units 10/15/23 07:49  Glucose 70 - 99 mg/dL 87  Hemoglobin J8R 4.8 - 5.6 % 5.0  Est. average glucose Bld gHb Est-mCnc mg/dL 97  INSULIN  2.6 - 24.9 uIU/mL 14.0   CBG and A1c both stable and at goal Insulin  level worsened and above goal She is on weekly Zepbound  5 mg and daily Metformin  500mg  BID with meals Denies mass in neck, dysphagia, dyspepsia, persistent hoarseness, abdominal pain, or N/V/C   3. B12 deficiency Discussed Labs Vitamin B12 232 - 1245  pg/mL >2000 (H)  (H): Data is abnormally high  Once she reviewed over replaced B12- she stopped oral supplement  4. Insulin  resistance Discussed Labs  Latest Reference Range & Units 10/15/23 07:49  Glucose 70 - 99 mg/dL 87  Hemoglobin J8R 4.8 - 5.6 % 5.0  Est. average glucose Bld gHb Est-mCnc mg/dL 97  INSULIN  2.6 - 24.9 uIU/mL 14.0   CBG and A1c both stable and at goal Insulin  level worsened and above gaol She is on weekly Zepbound  5 mg and daily Metformin  500mg  BID with meals Denies mass in neck, dysphagia, dyspepsia, persistent hoarseness, abdominal pain, or N/V/C   5. Polyphagia  10/15/2023 Zepbound  increased from 2.5mg  to 5 mg  Denies mass in neck, dysphagia, dyspepsia, persistent hoarseness, abdominal pain, or N/V/C   6. Pure hypercholesterolemia Discussed Labs Lipid Panel     Component Value Date/Time   CHOL 189 10/15/2023 0749   TRIG 70 10/15/2023 0749   HDL 55 10/15/2023 0749   CHOLHDL 2.6 12/26/2021 1158   LDLCALC 121 (H) 10/15/2023 0749   LDLCALC 72 12/26/2021 1158   LABVLDL 13 10/15/2023 0749   The 10-year ASCVD risk score (Arnett DK, et al., 2019) is: 0.8%   Values used to calculate the score:     Age: 69 years     Clincally relevant sex: Female     Is Non-Hispanic African American: No     Diabetic: No     Tobacco smoker: No     Systolic Blood Pressure:  118 mmHg     Is BP treated: Yes     HDL Cholesterol: 55 mg/dL     Total Cholesterol: 189 mg/dL   Slight bump in LDL, however ASCVD risk stratification is acceptable  Assessment/Plan:   1. Vitamin D  deficiency (Primary) Continue weekly Ergocalciferol - does not request refill today  2. Prediabetes Continue Metformin  500mg  BID with meals-does not request refill today Refill  tirzepatide  5 MG/0.5ML injection vial Inject 5 mg into the skin once a week. Dispense: 2 mL, Refills: 2 ordered   3. B12 deficiency Remain off oral B12 supplementation  4. Insulin  resistance Continue Metformin  500mg  BID  with meals-does not request refill today Refill  tirzepatide  5 MG/0.5ML injection vial Inject 5 mg into the skin once a week. Dispense: 2 mL, Refills: 2 ordered   5. Polyphagia Refill  tirzepatide  5 MG/0.5ML injection vial Inject 5 mg into the skin once a week. Dispense: 2 mL, Refills: 2 ordered   6. Pure hypercholesterolemia Increase cardiovascular exercise and reduce sat fat  7. BMI 39.0-39.9,adult Refill  tirzepatide  5 MG/0.5ML injection vial Inject 5 mg into the skin once a week. Dispense: 2 mL, Refills: 2 ordered   Kahliya is currently in the action stage of change. As such, her goal is to continue with weight loss efforts. She has agreed to keeping a food journal and adhering to recommended goals of 1500 calories and 85g+ protein.   Exercise goals: All adults should avoid inactivity. Some physical activity is better than none, and adults who participate in any amount of physical activity gain some health benefits. Adults should also include muscle-strengthening activities that involve all major muscle groups on 2 or more days a week.  Behavioral modification strategies: increasing lean protein intake, decreasing simple carbohydrates, increasing vegetables, increasing water intake, no skipping meals, meal planning and cooking strategies, keeping healthy foods in the home, ways to avoid boredom eating, and planning for success.  Symphanie has agreed to follow-up with our clinic in 4 weeks. She was informed of the importance of frequent follow-up visits to maximize her success with intensive lifestyle modifications for her multiple health conditions.   Objective:   Height 5' 7 (1.702 m). Body mass index is 39.91 kg/m.  General: Cooperative, alert, well developed, in no acute distress. HEENT: Conjunctivae and lids unremarkable. Cardiovascular: Regular rhythm.  Lungs: Normal work of breathing. Neurologic: No focal deficits.   Lab Results  Component Value Date   CREATININE 0.90  10/15/2023   BUN 15 10/15/2023   NA 139 10/15/2023   K 4.4 10/15/2023   CL 104 10/15/2023   CO2 19 (L) 10/15/2023   Lab Results  Component Value Date   ALT 9 10/15/2023   AST 12 10/15/2023   ALKPHOS 64 10/15/2023   BILITOT 0.5 10/15/2023   Lab Results  Component Value Date   HGBA1C 5.0 10/15/2023   HGBA1C 5.1 03/26/2023   HGBA1C 5.4 09/25/2022   HGBA1C 5.1 07/10/2021   HGBA1C 5.0 08/17/2019   Lab Results  Component Value Date   INSULIN  14.0 10/15/2023   INSULIN  6.8 03/26/2023   INSULIN  7.8 09/25/2022   INSULIN  8.0 01/09/2022   Lab Results  Component Value Date   TSH 1.32 11/26/2023   Lab Results  Component Value Date   CHOL 189 10/15/2023   HDL 55 10/15/2023   LDLCALC 121 (H) 10/15/2023   TRIG 70 10/15/2023   CHOLHDL 2.6 12/26/2021   Lab Results  Component Value Date   VD25OH 57.8 10/15/2023  VD25OH 67.2 08/06/2023   VD25OH 66.3 03/26/2023   Lab Results  Component Value Date   WBC 4.9 10/15/2023   HGB 15.0 10/15/2023   HCT 43.6 10/15/2023   MCV 97 10/15/2023   PLT 262 10/15/2023   No results found for: IRON, TIBC, FERRITIN  Attestation Statements:   Reviewed by clinician on day of visit: allergies, medications, problem list, medical history, surgical history, family history, social history, and previous encounter notes.  I have reviewed the above documentation for accuracy and completeness, and I agree with the above. -  Lemma Tetro d. Twilla Khouri, NP-C

## 2023-12-11 ENCOUNTER — Ambulatory Visit: Admitting: Gastroenterology

## 2023-12-11 ENCOUNTER — Encounter: Payer: Self-pay | Admitting: Gastroenterology

## 2023-12-11 VITALS — BP 125/77 | HR 66 | Temp 99.0°F | Resp 13 | Ht 67.0 in | Wt 249.2 lb

## 2023-12-11 DIAGNOSIS — K648 Other hemorrhoids: Secondary | ICD-10-CM

## 2023-12-11 DIAGNOSIS — D122 Benign neoplasm of ascending colon: Secondary | ICD-10-CM

## 2023-12-11 DIAGNOSIS — K644 Residual hemorrhoidal skin tags: Secondary | ICD-10-CM

## 2023-12-11 DIAGNOSIS — Z1211 Encounter for screening for malignant neoplasm of colon: Secondary | ICD-10-CM

## 2023-12-11 DIAGNOSIS — K635 Polyp of colon: Secondary | ICD-10-CM | POA: Diagnosis not present

## 2023-12-11 MED ORDER — SODIUM CHLORIDE 0.9 % IV SOLN: 500.0000 mL | INTRAVENOUS | Status: DC

## 2023-12-11 NOTE — Patient Instructions (Signed)
 Resume all of your previous medications today as ordered.  Read your discharge instructions.  YOU HAD AN ENDOSCOPIC PROCEDURE TODAY AT THE [facility]:   Refer to the procedure report that was given to you for any specific questions about what was found during the examination.  If the procedure report does not answer your questions, please call your gastroenterologist to clarify.  If you requested that your care partner not be given the details of your procedure findings, then the procedure report has been included in a sealed envelope for you to review at your convenience later.  YOU SHOULD EXPECT: Some feelings of bloating in the abdomen. Passage of more gas than usual.  Walking can help get rid of the air that was put into your GI tract during the procedure and reduce the bloating. If you had a lower endoscopy (such as a colonoscopy or flexible sigmoidoscopy) you may notice spotting of blood in your stool or on the toilet paper. If you underwent a bowel prep for your procedure, you may not have a normal bowel movement for a few days.  Please Note:  You might notice some irritation and congestion in your nose or some drainage.  This is from the oxygen used during your procedure.  There is no need for concern and it should clear up in a day or so.  SYMPTOMS TO REPORT IMMEDIATELY:  Following lower endoscopy (colonoscopy or flexible sigmoidoscopy):  Excessive amounts of blood in the stool  Significant tenderness or worsening of abdominal pains  Swelling of the abdomen that is new, acute  Fever of 100F or higher  For urgent or emergent issues, a gastroenterologist can be reached at any hour by calling [phone]. Do not use MyChart messaging for urgent concerns.    DIET:  We do recommend a small meal at first, but then you may proceed to your regular diet.  Drink plenty of fluids but you should avoid alcoholic beverages for 24 hours.  ACTIVITY:  You should plan to take it easy  for the rest of today and you should NOT DRIVE or use heavy machinery until tomorrow (because of the sedation medicines used during the test).    FOLLOW UP: Our staff will call the number listed on your records the next business day following your procedure.  We will call around 7:15- 8:00 am to check on you and address any questions or concerns that you may have regarding the information given to you following your procedure. If we do not reach you, we will leave a message.     If any biopsies were taken you will be contacted by phone or by letter within the next 1-3 weeks.  Please call us at [phone] if you have not heard about the biopsies in 3 weeks.    SIGNATURES/CONFIDENTIALITY: You and/or your care partner have signed paperwork which will be entered into your electronic medical record.  These signatures attest to the fact that that the information above on your After Visit Summary has been reviewed and is understood.  Full responsibility of the confidentiality of this discharge information lies with you and/or your care-partner.

## 2023-12-11 NOTE — Progress Notes (Unsigned)
 Pt sedate, gd SR's, VSS, report to RN

## 2023-12-11 NOTE — Op Note (Signed)
 [facility] Patient Name: Penny Hancock Procedure Date: 12/11/2023 1:07 PM MRN: [id number] Endoscopist: Gustav ALONSO Mcgee , MD, [id number] Age: 43 Referring MD:  Date of Birth: 1981-05-04 Gender: Female Account #: [id number] Procedure:                Colonoscopy Indications:              Screening for colorectal malignant neoplasm Medicines:                Monitored Anesthesia Care Procedure:                Pre-Anesthesia Assessment:                           - Prior to the procedure, a History and Physical                            was performed, and patient medications and                            allergies were reviewed. The patient's tolerance of                            previous anesthesia was also reviewed. The risks                            and benefits of the procedure and the sedation                            options and risks were discussed with the patient.                            All questions were answered, and informed consent                            was obtained. Prior Anticoagulants: The patient                            last took Eliquis  (apixaban ) 2 days prior to the                            procedure. ASA Grade Assessment: III - A patient                            with severe systemic disease. After reviewing the                            risks and benefits, the patient was deemed in                            satisfactory condition to undergo the procedure.                           After obtaining informed consent, the colonoscope  was passed under direct vision. Throughout the                            procedure, the patient's blood pressure, pulse, and                            oxygen saturations were monitored continuously. The                            Olympus Scope SN: [id number] was introduced through                            the anus and advanced to the the terminal ileum,                             with identification of the appendiceal orifice and                            IC valve. The colonoscopy was performed without                            difficulty. The patient tolerated the procedure                            well. The quality of the bowel preparation was                            good. The terminal ileum, ileocecal valve,                            appendiceal orifice, and rectum were photographed. Scope In: 1:42:53 PM Scope Out: 2:07:02 PM Scope Withdrawal Time: 0 hours 14 minutes 27 seconds  Total Procedure Duration: 0 hours 24 minutes 9 seconds  Findings:                 The perianal and digital rectal examinations were                            normal.                           A 3 mm polyp was found in the ascending colon. The                            polyp was sessile. The polyp was removed with a                            cold snare. Resection and retrieval were complete.                           Non-bleeding external and internal hemorrhoids were                            found during retroflexion. The hemorrhoids were  small. Complications:            No immediate complications. Estimated Blood Loss:     Estimated blood loss was minimal. Impression:               - One 3 mm polyp in the ascending colon, removed                            with a cold snare. Resected and retrieved.                           - Non-bleeding external and internal hemorrhoids. Recommendation:           - Patient has a contact number available for                            emergencies. The signs and symptoms of potential                            delayed complications were discussed with the                            patient. Return to normal activities tomorrow.                            Written discharge instructions were provided to the                            patient.                           - Resume previous diet.                            - Continue present medications.                           - Await pathology results.                           - Repeat colonoscopy in 7-10 years for surveillance                            based on pathology results.                           - Resume Eliquis  (apixaban ) at prior dose today. Brandt Chaney V. Amjad Fikes, MD 12/11/2023 2:19:28 PM This report has been signed electronically.

## 2023-12-11 NOTE — Progress Notes (Unsigned)
 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 Denies any nausea, vomiting, abdominal pain, melena or bright red blood per rectum  The risks and benefits as well as alternatives of endoscopic procedure(s) have been discussed and reviewed. All questions answered. The patient agrees to proceed.    Past Medical History:  Diagnosis Date   Back pain    Clotting disorder (HCC)    GERD (gastroesophageal reflux disease)    resolved    H/O blood clots    High cholesterol    Hypertension    Hypothyroidism    Joint pain    PONV (postoperative nausea and vomiting)    mild nausea responsive to meds    Pulmonary embolism (HCC) 10/2017   Thyroid  carcinoma (HCC)    Vitamin D  deficiency     Past Surgical History:  Procedure Laterality Date   broken wrist     KNEE SURGERY     arthroscopy    THYROIDECTOMY Right 07/24/2018   Procedure: RIGHT THYROID  LOBECTOMY  AND ISTHMUS;  Surgeon: Eletha Boas, MD;  Location: [facility];  Service: General;  Laterality: Right;    Prior to Admission medications   Medication Sig Start Date End Date Taking? Authorizing Provider  buPROPion  (WELLBUTRIN  SR) 150 MG 12 hr tablet Take 1 tablet (150 mg total) by mouth 2 (two) times daily. 11/12/23  Yes Beasley, Caren D, MD  levothyroxine  (SYNTHROID ) 75 MCG tablet Take by mouth 1 tablet daily 11/27/23  Yes Trixie File, MD  metFORMIN  (GLUCOPHAGE ) 500 MG tablet Take 1 tablet (500 mg total) by mouth 2 (two) times daily with a meal. 11/12/23  Yes Verdon Parry D, MD  metoprolol  tartrate (LOPRESSOR ) 25 MG tablet Take 25 mg by mouth daily. 10/23/23  Yes [provider]  rosuvastatin  (CRESTOR ) 5 MG tablet Take 1 tablet (5 mg total) by mouth  daily. 11/12/23  Yes Beasley, Caren D, MD  Vitamin D , Ergocalciferol , (DRISDOL ) 1.25 MG (50000 UNIT) CAPS capsule Take 1 capsule (50,000 Units total) by mouth once a week. 11/12/23  Yes Beasley, Caren D, MD  apixaban  (ELIQUIS ) 5 MG TABS tablet Take 1 tablet (5 mg total) by mouth 2 (two) times daily. 04/04/23   Cloretta Arley NOVAK, MD  tirzepatide  5 MG/0.5ML injection vial Inject 5 mg into the skin once a week. 12/10/23   Jonel Pee D, NP    Current Outpatient Medications  Medication Sig Dispense Refill   buPROPion  (WELLBUTRIN  SR) 150 MG 12 hr tablet Take 1 tablet (150 mg total) by mouth 2 (two) times daily. 180 tablet 0   levothyroxine  (SYNTHROID ) 75 MCG tablet Take by mouth 1 tablet daily 90 tablet 3   metFORMIN  (GLUCOPHAGE ) 500 MG tablet Take 1 tablet (500 mg total) by mouth 2 (two) times daily with a meal. 180 tablet 0   metoprolol  tartrate (LOPRESSOR ) 25 MG tablet Take 25 mg by mouth daily.     rosuvastatin  (CRESTOR ) 5 MG tablet Take 1 tablet (5 mg total) by mouth daily. 90 tablet 0   Vitamin D , Ergocalciferol , (DRISDOL ) 1.25 MG (50000 UNIT) CAPS capsule Take 1 capsule (50,000 Units total) by mouth once a week. 13 capsule 0   apixaban  (ELIQUIS ) 5 MG TABS tablet Take 1 tablet (5 mg  total) by mouth 2 (two) times daily. 180 tablet 3   tirzepatide  5 MG/0.5ML injection vial Inject 5 mg into the skin once a week. 2 mL 2   Current Facility-Administered Medications  Medication Dose Route Frequency Provider Last Rate Last Admin   0.9 %  sodium chloride  infusion  500 mL Intravenous Continuous Aubrianne Molyneux V, MD        Allergies as of 12/11/2023   (No Known Allergies)    Family History  Problem Relation Age of Onset   Thyroid  disease Mother    Breast cancer Maternal Aunt 28   Stomach cancer Maternal Grandmother        Unsure if pancreatic of stomach origin   Heart disease Paternal Grandmother    Heart disease Paternal Grandfather    Colon cancer Neg Hx    Esophageal cancer Neg Hx     Rectal cancer Neg Hx     Social History   Socioeconomic History   Marital status: Single    Spouse name: Not on file   Number of children: Not on file   Years of education: Not on file   Highest education level: Doctorate  Occupational History   Not on file  Tobacco Use   Smoking status: Never   Smokeless tobacco: Never  Vaping Use   Vaping status: Never Used  Substance and Sexual Activity   Alcohol use: Yes    Comment: occasional   Drug use: Never   Sexual activity: Yes    Comment: Partner has vasectomy  Other Topics Concern   Not on file  Social History Narrative   Social history: She is single.  Social alcohol consumption.  Has steady female partner.  Non-smoker.       Family history: Parents and twin brothers in good health   Social Drivers of Health   Financial Resource Strain: Low Risk  (04/11/2023)   Overall Financial Resource Strain (CARDIA)    Difficulty of Paying Living Expenses: Not hard at all  Food Insecurity: No Food Insecurity (04/11/2023)   Hunger Vital Sign    Worried About Running Out of Food in the Last Year: Never true    Ran Out of Food in the Last Year: Never true  Transportation Needs: No Transportation Needs (04/11/2023)   PRAPARE - Administrator, Civil Service (Medical): No    Lack of Transportation (Non-Medical): No  Physical Activity: Insufficiently Active (04/11/2023)   Exercise Vital Sign    Days of Exercise per Week: 3 days    Minutes of Exercise per Session: 20 min  Stress: Stress Concern Present (04/11/2023)   [employer] of Occupational Health - Occupational Stress Questionnaire    Feeling of Stress : Rather much  Social Connections: Moderately Isolated (04/11/2023)   Social Connection and Isolation Panel    Frequency of Communication with Friends and Family: Twice a week    Frequency of Social Gatherings with Friends and Family: Once a week    Attends Religious Services: 1 to 4 times per year    Active Member of  [employer] or Organizations: No    Attends Engineer, structural: Not on file    Marital Status: Never married  Intimate Partner Violence: Not on file    Review of Systems:  All other review of systems negative except as mentioned in the HPI.  Physical Exam: Vital signs in last 24 hours: BP 134/83   Pulse 75   Temp 99 F (37.2 C) (Temporal)   Resp  13   Ht 5' 7 (1.702 m)   Wt 249 lb 3.2 oz (113 kg)   SpO2 100%   BMI 39.03 kg/m  General:   Alert, NAD Lungs:  Clear .   Heart:  Regular rate and rhythm Abdomen:  Soft, nontender and nondistended. Neuro/Psych:  Alert and cooperative. Normal mood and affect. A and O x 3  Reviewed labs, radiology imaging, old records and pertinent past GI work up  Patient is appropriate for planned procedure(s) and anesthesia in an ambulatory setting   K. Veena Kyndel Egger , MD [phone]

## 2023-12-12 ENCOUNTER — Telehealth: Payer: Self-pay

## 2023-12-12 NOTE — Telephone Encounter (Signed)
 LMOM

## 2023-12-17 LAB — SURGICAL PATHOLOGY

## 2023-12-23 ENCOUNTER — Other Ambulatory Visit (HOSPITAL_COMMUNITY): Payer: Self-pay

## 2024-01-16 ENCOUNTER — Ambulatory Visit (INDEPENDENT_AMBULATORY_CARE_PROVIDER_SITE_OTHER): Admitting: Adult Health

## 2024-01-16 ENCOUNTER — Ambulatory Visit (INDEPENDENT_AMBULATORY_CARE_PROVIDER_SITE_OTHER): Admitting: Family Medicine

## 2024-01-16 ENCOUNTER — Encounter (INDEPENDENT_AMBULATORY_CARE_PROVIDER_SITE_OTHER): Payer: Self-pay | Admitting: Adult Health

## 2024-01-16 VITALS — BP 117/80 | HR 75 | Temp 97.6°F | Ht 67.0 in | Wt 246.0 lb

## 2024-01-16 DIAGNOSIS — R632 Polyphagia: Secondary | ICD-10-CM | POA: Diagnosis not present

## 2024-01-16 DIAGNOSIS — E538 Deficiency of other specified B group vitamins: Secondary | ICD-10-CM | POA: Diagnosis not present

## 2024-01-16 DIAGNOSIS — E88819 Insulin resistance, unspecified: Secondary | ICD-10-CM

## 2024-01-16 DIAGNOSIS — E559 Vitamin D deficiency, unspecified: Secondary | ICD-10-CM | POA: Diagnosis not present

## 2024-01-16 DIAGNOSIS — Z6839 Body mass index (BMI) 39.0-39.9, adult: Secondary | ICD-10-CM

## 2024-01-16 DIAGNOSIS — E669 Obesity, unspecified: Secondary | ICD-10-CM

## 2024-01-16 DIAGNOSIS — Z6841 Body Mass Index (BMI) 40.0 and over, adult: Secondary | ICD-10-CM

## 2024-01-16 DIAGNOSIS — Z6838 Body mass index (BMI) 38.0-38.9, adult: Secondary | ICD-10-CM

## 2024-01-16 MED ORDER — ZEPBOUND 7.5 MG/0.5ML ~~LOC~~ SOLN: 7.5000 mg | SUBCUTANEOUS | 0 refills | Status: DC

## 2024-01-16 NOTE — Progress Notes (Signed)
 WEIGHT SUMMARY AND BIOMETRICS  Vitals Temp: 97.6 F (36.4 C) BP: 117/80 Pulse Rate: 75 SpO2: 100 %   Anthropometric Measurements Height: 5' 7 (1.702 m) Weight: 246 lb (111.6 kg) BMI (Calculated): 38.52 Weight at Last Visit: 247 lb Weight Lost Since Last Visit: 1 lb Weight Gained Since Last Visit: 0 Starting Weight: 300 lb Total Weight Loss (lbs): 54 lb (24.5 kg) Peak Weight: 320 lb   Body Composition  Body Fat %: 40.7 % Fat Mass (lbs): 100.2 lbs Muscle Mass (lbs): 138.8 lbs Total Body Water (lbs): 97 lbs Visceral Fat Rating : 11   Other Clinical Data Fasting: no Labs: no Today's Visit #: 28 Starting Date: 01/09/22    Chief Complaint:   OBESITY Penny Hancock is here to discuss her progress with her obesity treatment plan.  She is on the keeping a food journal and adhering to recommended goals of 1500 calories and 85g+ protein and states she is following her eating plan approximately 80 % of the time.  She states she is exercising Walking/Strength Training 20 minutes 2 times per week.  Interim History:  08/06/2023 she started on loading dose weekly of Zepbound  2.5mg  10/15/2023 Zepbound  2.5mg  increased to 5mg  She reports taking Zepbound  5mg  + 2.5mg  (leftover supply) and feels that the 7.5mg  weekly dose is better controlling hunger levels without SE.  She was seen by Endo/Dr. Vianne on 11/26/2023- labs completed and continued on current Levothyroxine  dose of 75mcg   Reviewed Bioimpedance results with pt: Muscle Mass: +17.2 lbs Adipose Mass: -19.4 lbs  She recently traveled to [place], [place]. Enjoyed local cuisine, attended a concert, and went to [employer] She walked frequently and remained hydrated with water.  Subjective:   1. Insulin  resistance She is on daily Metofmrin 500mg  BID with meals and weekly Zepbound - she recently increase from 5mg  to 7.5mg  (via a dose of 5mg  plus an extra 2.5mg  dose). Denies mass in neck, dysphagia, dyspepsia,  persistent hoarseness, abdominal pain, or N/V/C   2. Polyphagia She is on daily Metofmrin 500mg  BID with meals and weekly Zepbound - she recently increase from 5mg  to 7.5mg  (via a dose of 5mg  plus an extra 2.5mg  dose). Denies mass in neck, dysphagia, dyspepsia, persistent hoarseness, abdominal pain, or N/V/C  She denies breakthrough polyphagia with current antidiabetic regime  3. B12 deficiency  Latest Reference Range & Units 10/15/23 07:49  Vitamin D , 25-Hydroxy 30.0 - 100.0 ng/mL 57.8  Vitamin B12 232 - 1245 pg/mL >2000 (H)  (H): Data is abnormally high  She has remained off oral B12 supplementation  4. Vitamin D  deficiency  Latest Reference Range & Units 10/15/23 07:49  Vitamin D , 25-Hydroxy 30.0 - 100.0 ng/mL 57.8  Vitamin B12 232 - 1245 pg/mL >2000 (H)  (H): Data is abnormally high  She is on weekly Ergocalciferol  and has remained off daily B12 supplementation  Assessment/Plan:   1. Insulin  resistance (Primary) Continue to increase daily protein and limit sugar/simple CHO Continue cardiovascular exercise and strength training Continue daily Metformin  500mg  BID with meals and weekly Zepbound  7.5mg    2. Polyphagia Continue to increase daily protein and limit sugar/simple CHO Continue cardiovascular exercise and strength training Continue daily Metformin  500mg  BID with meals and weekly Zepbound  7.5mg    3. B12 deficiency Remain off oral B12 supplementation Check Labs late summer 2025  4. Vitamin D  deficiency Continue weekly Ergocalciferol - denies need for refill today  5. BMI 39.0-39.9,adult, CURRENT BMI 38.6 Refill and INCREASE  tirzepatide  (ZEPBOUND ) 7.5 MG/0.5ML injection vial Inject 7.5  mg into the skin once a week. Dispense: 2 mL, Refills: 0 ordered   Penny Hancock is currently in the action stage of change. As such, her goal is to continue with weight loss efforts. She has agreed to keeping a food journal and adhering to recommended goals of 1500 calories and 85g+  protein.   Exercise goals: For substantial health benefits, adults should do at least 150 minutes (2 hours and 30 minutes) a week of moderate-intensity, or 75 minutes (1 hour and 15 minutes) a week of vigorous-intensity aerobic physical activity, or an equivalent combination of moderate- and vigorous-intensity aerobic activity. Aerobic activity should be performed in episodes of at least 10 minutes, and preferably, it should be spread throughout the week.  Behavioral modification strategies: increasing lean protein intake, decreasing simple carbohydrates, increasing vegetables, increasing water intake, no skipping meals, meal planning and cooking strategies, keeping healthy foods in the home, and ways to avoid boredom eating.  Penny Hancock has agreed to follow-up with our clinic in 4 weeks. She was informed of the importance of frequent follow-up visits to maximize her success with intensive lifestyle modifications for her multiple health conditions.   Check Fasting Labs late summer 2025  Objective:   Blood pressure 117/80, pulse 75, temperature 97.6 F (36.4 C), height 5' 7 (1.702 m), weight 246 lb (111.6 kg), last menstrual period 01/15/2024, SpO2 100%. Body mass index is 38.53 kg/m.  General: Cooperative, alert, well developed, in no acute distress. HEENT: Conjunctivae and lids unremarkable. Cardiovascular: Regular rhythm.  Lungs: Normal work of breathing. Neurologic: No focal deficits.   Lab Results  Component Value Date   CREATININE 0.90 10/15/2023   BUN 15 10/15/2023   NA 139 10/15/2023   K 4.4 10/15/2023   CL 104 10/15/2023   CO2 19 (L) 10/15/2023   Lab Results  Component Value Date   ALT 9 10/15/2023   AST 12 10/15/2023   ALKPHOS 64 10/15/2023   BILITOT 0.5 10/15/2023   Lab Results  Component Value Date   HGBA1C 5.0 10/15/2023   HGBA1C 5.1 03/26/2023   HGBA1C 5.4 09/25/2022   HGBA1C 5.1 07/10/2021   HGBA1C 5.0 08/17/2019   Lab Results  Component Value Date    INSULIN  14.0 10/15/2023   INSULIN  6.8 03/26/2023   INSULIN  7.8 09/25/2022   INSULIN  8.0 01/09/2022   Lab Results  Component Value Date   TSH 1.32 11/26/2023   Lab Results  Component Value Date   CHOL 189 10/15/2023   HDL 55 10/15/2023   LDLCALC 121 (H) 10/15/2023   TRIG 70 10/15/2023   CHOLHDL 2.6 12/26/2021   Lab Results  Component Value Date   VD25OH 57.8 10/15/2023   VD25OH 67.2 08/06/2023   VD25OH 66.3 03/26/2023   Lab Results  Component Value Date   WBC 4.9 10/15/2023   HGB 15.0 10/15/2023   HCT 43.6 10/15/2023   MCV 97 10/15/2023   PLT 262 10/15/2023   No results found for: IRON, TIBC, FERRITIN  Attestation Statements:   Reviewed by clinician on day of visit: allergies, medications, problem list, medical history, surgical history, family history, social history, and previous encounter notes.  I have reviewed the above documentation for accuracy and completeness, and I agree with the above. -  Gerardo Territo d. Saketh Daubert, NP-C

## 2024-02-05 ENCOUNTER — Ambulatory Visit: Payer: Self-pay | Admitting: Gastroenterology

## 2024-02-11 ENCOUNTER — Other Ambulatory Visit (INDEPENDENT_AMBULATORY_CARE_PROVIDER_SITE_OTHER): Payer: Self-pay | Admitting: Adult Health

## 2024-02-11 ENCOUNTER — Encounter (INDEPENDENT_AMBULATORY_CARE_PROVIDER_SITE_OTHER): Payer: Self-pay | Admitting: Family Medicine

## 2024-02-11 ENCOUNTER — Ambulatory Visit (INDEPENDENT_AMBULATORY_CARE_PROVIDER_SITE_OTHER): Admitting: Family Medicine

## 2024-02-11 VITALS — BP 109/75 | HR 75 | Temp 98.3°F | Ht 67.0 in | Wt 241.0 lb

## 2024-02-11 DIAGNOSIS — E66813 Obesity, class 3: Secondary | ICD-10-CM

## 2024-02-11 DIAGNOSIS — E88819 Insulin resistance, unspecified: Secondary | ICD-10-CM

## 2024-02-11 DIAGNOSIS — E669 Obesity, unspecified: Secondary | ICD-10-CM

## 2024-02-11 DIAGNOSIS — E785 Hyperlipidemia, unspecified: Secondary | ICD-10-CM | POA: Diagnosis not present

## 2024-02-11 DIAGNOSIS — R7303 Prediabetes: Secondary | ICD-10-CM | POA: Diagnosis not present

## 2024-02-11 DIAGNOSIS — Z6837 Body mass index (BMI) 37.0-37.9, adult: Secondary | ICD-10-CM

## 2024-02-11 DIAGNOSIS — E559 Vitamin D deficiency, unspecified: Secondary | ICD-10-CM | POA: Diagnosis not present

## 2024-02-11 DIAGNOSIS — E7800 Pure hypercholesterolemia, unspecified: Secondary | ICD-10-CM

## 2024-02-11 DIAGNOSIS — F5089 Other specified eating disorder: Secondary | ICD-10-CM | POA: Diagnosis not present

## 2024-02-11 DIAGNOSIS — F3289 Other specified depressive episodes: Secondary | ICD-10-CM

## 2024-02-11 DIAGNOSIS — Z6838 Body mass index (BMI) 38.0-38.9, adult: Secondary | ICD-10-CM

## 2024-02-11 MED ORDER — VITAMIN D (ERGOCALCIFEROL) 1.25 MG (50000 UNIT) PO CAPS
50000.0000 [IU] | ORAL_CAPSULE | ORAL | 0 refills | Status: DC
Start: 2024-02-11 — End: 2024-05-11

## 2024-02-11 MED ORDER — ROSUVASTATIN CALCIUM 5 MG PO TABS
5.0000 mg | ORAL_TABLET | Freq: Every day | ORAL | 0 refills | Status: DC
Start: 2024-02-11 — End: 2024-05-11

## 2024-02-11 MED ORDER — METFORMIN HCL 500 MG PO TABS: 500.0000 mg | ORAL_TABLET | Freq: Two times a day (BID) | ORAL | 0 refills | Status: DC

## 2024-02-11 MED ORDER — ZEPBOUND 7.5 MG/0.5ML ~~LOC~~ SOLN: 7.5000 mg | SUBCUTANEOUS | 0 refills | Status: DC

## 2024-02-11 MED ORDER — BUPROPION HCL ER (SR) 150 MG PO TB12: 150.0000 mg | ORAL_TABLET | Freq: Two times a day (BID) | ORAL | 0 refills | Status: DC

## 2024-02-11 NOTE — Progress Notes (Signed)
 Office: [phone]  /  Fax: [phone]  WEIGHT SUMMARY AND BIOMETRICS  Anthropometric Measurements Height: 5' 7 (1.702 m) Weight: 241 lb (109.3 kg) BMI (Calculated): 37.74 Weight at Last Visit: 247 lb Weight Lost Since Last Visit: 5 lb Weight Gained Since Last Visit: 0 Starting Weight: 300 lb Total Weight Loss (lbs): 59 lb (26.8 kg) Peak Weight: 320 lb   Body Composition  Body Fat %: 47.7 % Fat Mass (lbs): 115.4 lbs Muscle Mass (lbs): 120 lbs Total Body Water (lbs): 89.6 lbs Visceral Fat Rating : 13   Other Clinical Data Fasting: no Labs: no Today's Visit #: 29 Starting Date: 01/09/22 Comments: 1500+80    Chief Complaint: OBESITY   History of Present Illness Penny Wanda Ned, 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  She is adhering to a calorie-restricted diet plan with a daily intake of 1500 calories and at least 80 grams of protein, achieving this about 90% of the time. She has lost 5 pounds in the last month. Her exercise routine includes walking and resistance training, both at home and in her office, with gradual increases in repetitions and integration into daily activities.  Her vitamin D  deficiency is managed with prescription vitamin D  50,000 IU per week, and she requests a refill.  Hyperlipidemia is managed with Crestor , alongside diet, exercise, and weight loss. She requests a refill for Crestor .  She manages prediabetes with metformin , in addition to diet, exercise, and weight loss, and requests a refill.  Emotional eating behaviors are treated with Wellbutrin , and she reports stability and requests a refill.  Obesity is treated with Zepbound  7.5 mg, and she requests a refill. She prefers taking her current dose on Wednesdays, as she notices increased fullness for the next two to three days, which helps her manage hunger over  the weekend when she is more likely to eat out with family. No nausea or symptoms of hypoglycemia are reported, and she maintains her dietary needs.  She consumes around 80 grams of protein daily, which she finds challenging to meet consistently. She uses protein pudding and light Babybel cheeses to help increase her protein intake. She typically consumes 1200 to 1300 calories daily, which is below her target, but she feels full and satisfied.  She recently traveled to [place] for a conference, where she enjoyed [employer] and participated in a bus tour. 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      PHYSICAL EXAM:  Blood pressure 109/75, pulse 75, temperature 98.3 F (36.8 C), height 5' 7 (1.702 m), weight 241 lb (109.3 kg), last menstrual period 01/15/2024, SpO2 97%. Body mass index is 37.75 kg/m.  DIAGNOSTIC DATA REVIEWED:  BMET    Component Value Date/Time   NA 139 10/15/2023 0749   K 4.4 10/15/2023 0749   CL 104 10/15/2023 0749   CO2 19 (L) 10/15/2023 0749   GLUCOSE 87 10/15/2023 0749   GLUCOSE 108 (H) 07/10/2021 0912   BUN 15 10/15/2023 0749   CREATININE 0.90 10/15/2023 0749   CREATININE 0.85 07/10/2021 0912   CALCIUM  9.3 10/15/2023 0749   GFRNONAA 86 04/30/2019 0904   GFRAA 99 04/30/2019 0904   Lab Results  Component Value Date   HGBA1C 5.0 10/15/2023   HGBA1C 5.1 04/30/2019   Lab Results  Component Value Date   INSULIN  14.0 10/15/2023   INSULIN  8.0 01/09/2022  Lab Results  Component Value Date   TSH 1.32 11/26/2023   CBC    Component Value Date/Time   WBC 4.9 10/15/2023 0749   WBC 5.8 07/10/2021 0912   RBC 4.49 10/15/2023 0749   RBC 4.59 07/10/2021 0912   HGB 15.0 10/15/2023 0749   HCT 43.6 10/15/2023 0749   PLT 262 10/15/2023 0749   MCV 97 10/15/2023 0749   MCH 33.4 (H) 10/15/2023 0749   MCH 32.5 07/10/2021 0912   MCHC 34.4 10/15/2023 0749   MCHC 33.9 07/10/2021 0912   RDW 11.7  10/15/2023 0749   Iron Studies No results found for: IRON, TIBC, FERRITIN, IRONPCTSAT Lipid Panel     Component Value Date/Time   CHOL 189 10/15/2023 0749   TRIG 70 10/15/2023 0749   HDL 55 10/15/2023 0749   CHOLHDL 2.6 12/26/2021 1158   LDLCALC 121 (H) 10/15/2023 0749   LDLCALC 72 12/26/2021 1158   Hepatic Function Panel     Component Value Date/Time   PROT 6.1 10/15/2023 0749   ALBUMIN 3.9 10/15/2023 0749   AST 12 10/15/2023 0749   ALT 9 10/15/2023 0749   ALKPHOS 64 10/15/2023 0749   BILITOT 0.5 10/15/2023 0749   BILIDIR 0.1 08/17/2019 0902   IBILI 0.5 08/17/2019 0902      Component Value Date/Time   TSH 1.32 11/26/2023 1342   Nutritional Lab Results  Component Value Date   VD25OH 57.8 10/15/2023   VD25OH 67.2 08/06/2023   VD25OH 66.3 03/26/2023     Assessment and Plan Assessment & Plan Obesity Obesity management is ongoing with dietary changes, exercise, and pharmacotherapy. She has lost five pounds in the last month, indicating progress. She adheres to a 1500 calorie-restricted diet with a focus on consuming 80 or more grams of protein daily, achieving this about 90% of the time. Exercise includes walking and resistance training. She is on Zepbound  7.5 mg, which she tolerates well without nausea or hypoglycemia. She reports feeling fuller on weekends, aiding in managing eating habits during less structured days. - Continue Zepbound  7.5 mg - Continue 1500 calorie diet with focus on 80+ grams of protein daily - Encourage continuation of walking and resistance training - Refill Zepbound  prescription  Vitamin D  deficiency Vitamin D  deficiency is managed with prescription vitamin D  50,000 IU weekly. - Refill prescription for vitamin D  50,000 IU weekly  Hyperlipidemia Hyperlipidemia is managed with Crestor  in addition to diet, exercise, and weight loss. - Refill Crestor  prescription - Continue diet, exercise and weight loss as discussed today as an  important part of the treatment plan  Prediabetes Prediabetes is managed with metformin  in addition to diet, exercise, and weight loss. - Refill metformin  prescription - Continue diet, exercise and weight loss as discussed today as an important part of the treatment plan  Emotional eating behaviors Emotional eating behaviors are managed with Wellbutrin . - Refill Wellbutrin  prescription - Will continue to monitor for side effects and effectiveness    She was informed of the importance of frequent follow up visits to maximize her success with intensive lifestyle modifications for her multiple health conditions.    Louann Penton, MD

## 2024-02-28 DIAGNOSIS — F4323 Adjustment disorder with mixed anxiety and depressed mood: Secondary | ICD-10-CM | POA: Diagnosis not present

## 2024-03-05 DIAGNOSIS — F4323 Adjustment disorder with mixed anxiety and depressed mood: Secondary | ICD-10-CM | POA: Diagnosis not present

## 2024-03-09 ENCOUNTER — Other Ambulatory Visit (INDEPENDENT_AMBULATORY_CARE_PROVIDER_SITE_OTHER): Payer: Self-pay | Admitting: Family Medicine

## 2024-03-09 DIAGNOSIS — Z6841 Body Mass Index (BMI) 40.0 and over, adult: Secondary | ICD-10-CM

## 2024-03-10 ENCOUNTER — Encounter (INDEPENDENT_AMBULATORY_CARE_PROVIDER_SITE_OTHER): Payer: Self-pay | Admitting: Family Medicine

## 2024-03-10 ENCOUNTER — Ambulatory Visit (INDEPENDENT_AMBULATORY_CARE_PROVIDER_SITE_OTHER): Admitting: Family Medicine

## 2024-03-10 VITALS — BP 96/68 | HR 76 | Temp 98.3°F | Ht 67.0 in | Wt 239.0 lb

## 2024-03-10 DIAGNOSIS — E559 Vitamin D deficiency, unspecified: Secondary | ICD-10-CM

## 2024-03-10 DIAGNOSIS — E66813 Obesity, class 3: Secondary | ICD-10-CM | POA: Diagnosis not present

## 2024-03-10 DIAGNOSIS — Z6837 Body mass index (BMI) 37.0-37.9, adult: Secondary | ICD-10-CM

## 2024-03-10 DIAGNOSIS — E88819 Insulin resistance, unspecified: Secondary | ICD-10-CM

## 2024-03-10 MED ORDER — TIRZEPATIDE-WEIGHT MANAGEMENT 15 MG/0.5ML ~~LOC~~ SOLN: 15.0000 mg | SUBCUTANEOUS | 1 refills | Status: DC

## 2024-03-10 NOTE — Progress Notes (Signed)
 Office: [phone]  /  Fax: [phone]  WEIGHT SUMMARY AND BIOMETRICS  Anthropometric Measurements Height: 5' 7 (1.702 m) Weight: 239 lb (108.4 kg) BMI (Calculated): 37.42 Weight at Last Visit: 241 lb Weight Lost Since Last Visit: 2 lb Weight Gained Since Last Visit: 0 Starting Weight: 300 lb Total Weight Loss (lbs): 61 lb (27.7 kg) Peak Weight: 320 lb   Body Composition  Body Fat %: 47.7 % Fat Mass (lbs): 114.2 lbs Muscle Mass (lbs): 119 lbs Total Body Water (lbs): 88.4 lbs Visceral Fat Rating : 13   Other Clinical Data Fasting: no Labs: no Today's Visit #: 30 Starting Date: 01/09/22 Comments: 1500+    Chief Complaint: OBESITY   History of Present Illness Penny Wanda Ned, 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  She has been maintaining a food journal with a daily calorie goal of 1500 and a protein intake goal of 100 grams or more, achieving these targets approximately 85% of the time. She has incorporated walking for 30 minutes twice a week into her routine. She is also focusing on increasing her intake of fruits, vegetables, and hydration, while avoiding skipping meals and aiming for 7 to 9 hours of sleep most nights. She has lost 2 pounds in the past month since her last visit.  She feels 'draggy' today, with muscle shaking while holding a scope during a procedure, and feels tired throughout the day. She feels she has been hydrating adequately and has not been going too long without eating. Her blood sugar usually remains stable with her current medications, and her recent tiredness does not feel like previous low blood sugar episodes.  She has a history of vitamin D  deficiency and is on ergocalciferol  50,000 IU weekly. She also has insulin  resistance, managed with metformin  and Zepbound . She experienced a period of increased cravings and eating more than usual for two  weeks, which resolved after switching to a new batch of medication. She notes occasional constipation and nausea as side effects of her medication, particularly after increasing the dose.  She is currently on metformin , Zepbound , and bupropion  twice daily, although she sometimes misses doses. She has noticed an increase in energy recently and engages in physical activity through work and walking. She uses resistance bands for strengthening exercises, preferring them over weights.      PHYSICAL EXAM:  Blood pressure 96/68, pulse 76, temperature 98.3 F (36.8 C), height 5' 7 (1.702 m), weight 239 lb (108.4 kg), SpO2 98%. Body mass index is 37.43 kg/m.  DIAGNOSTIC DATA REVIEWED:  BMET    Component Value Date/Time   NA 139 10/15/2023 0749   K 4.4 10/15/2023 0749   CL 104 10/15/2023 0749   CO2 19 (L) 10/15/2023 0749   GLUCOSE 87 10/15/2023 0749   GLUCOSE 108 (H) 07/10/2021 0912   BUN 15 10/15/2023 0749   CREATININE 0.90 10/15/2023 0749   CREATININE 0.85 07/10/2021 0912   CALCIUM  9.3 10/15/2023 0749   GFRNONAA 86 04/30/2019 0904   GFRAA 99 04/30/2019 0904   Lab Results  Component Value Date   HGBA1C 5.0 10/15/2023   HGBA1C 5.1 04/30/2019   Lab Results  Component Value Date   INSULIN  14.0 10/15/2023   INSULIN  8.0 01/09/2022   Lab Results  Component Value Date   TSH 1.32 11/26/2023   CBC    Component Value Date/Time   WBC 4.9 10/15/2023 0749   WBC 5.8 07/10/2021 0912  RBC 4.49 10/15/2023 0749   RBC 4.59 07/10/2021 0912   HGB 15.0 10/15/2023 0749   HCT 43.6 10/15/2023 0749   PLT 262 10/15/2023 0749   MCV 97 10/15/2023 0749   MCH 33.4 (H) 10/15/2023 0749   MCH 32.5 07/10/2021 0912   MCHC 34.4 10/15/2023 0749   MCHC 33.9 07/10/2021 0912   RDW 11.7 10/15/2023 0749   Iron Studies No results found for: IRON, TIBC, FERRITIN, IRONPCTSAT Lipid Panel     Component Value Date/Time   CHOL 189 10/15/2023 0749   TRIG 70 10/15/2023 0749   HDL 55 10/15/2023 0749    CHOLHDL 2.6 12/26/2021 1158   LDLCALC 121 (H) 10/15/2023 0749   LDLCALC 72 12/26/2021 1158   Hepatic Function Panel     Component Value Date/Time   PROT 6.1 10/15/2023 0749   ALBUMIN 3.9 10/15/2023 0749   AST 12 10/15/2023 0749   ALT 9 10/15/2023 0749   ALKPHOS 64 10/15/2023 0749   BILITOT 0.5 10/15/2023 0749   BILIDIR 0.1 08/17/2019 0902   IBILI 0.5 08/17/2019 0902      Component Value Date/Time   TSH 1.32 11/26/2023 1342   Nutritional Lab Results  Component Value Date   VD25OH 57.8 10/15/2023   VD25OH 67.2 08/06/2023   VD25OH 66.3 03/26/2023     Assessment and Plan Assessment & Plan Class 3 obesity Class 3 obesity with recent weight loss of 2 pounds in the last month. Adhering to a calorie goal of 1500 and a protein goal of 100 grams per day, achieving this 85% of the time. Engaging in physical activity by walking 30 minutes twice a week and using resistance bands for strengthening. Experiencing occasional cravings and increased appetite, possibly related to medication batch variability. Discussed potential for dose adjustment of Zepbound  to manage cost and side effects, including nausea and constipation. Considered the shelf stability of the drug when extending the duration of use. - Continue current dietary plan with a focus on protein intake and hydration. - Encourage continued physical activity, including walking and resistance band exercises. - Adjust Zepbound  dosing strategy to 15 mg vials, administering 0.25 mL (7.5 MG) to extend duration and reduce cost. - Monitor for side effects such as nausea and constipation.  Insulin  resistance Insulin  resistance managed with metformin  and Zepbound . Blood sugar levels generally well-controlled with current medication regimen. No significant hypoglycemic episodes reported. Discussed potential impact of dietary changes and medication batch variability on blood sugar control. - Continue metformin  and Zepbound  as prescribed. -  Monitor blood sugar levels and report any significant changes or hypoglycemic episodes.  Vitamin D  deficiency Vitamin D  deficiency managed with ergocalciferol  50,000 IU weekly. No new symptoms or issues reported related to vitamin D  levels. - Continue ergocalciferol  50,000 IU weekly.        Daneesha was informed of the importance of frequent follow up visits to maximize her success with intensive lifestyle modifications for her obesity and obesity related health conditions as recommended by USPSTF and CMS guidelines   Louann Penton, MD

## 2024-03-24 DIAGNOSIS — F4323 Adjustment disorder with mixed anxiety and depressed mood: Secondary | ICD-10-CM | POA: Diagnosis not present

## 2024-03-31 ENCOUNTER — Inpatient Hospital Stay: Payer: BC Managed Care – PPO | Attending: Oncology | Admitting: Oncology

## 2024-03-31 VITALS — BP 106/75 | HR 80 | Temp 98.0°F | Resp 18 | Ht 67.0 in | Wt 243.0 lb

## 2024-03-31 DIAGNOSIS — E890 Postprocedural hypothyroidism: Secondary | ICD-10-CM | POA: Insufficient documentation

## 2024-03-31 DIAGNOSIS — I2699 Other pulmonary embolism without acute cor pulmonale: Secondary | ICD-10-CM | POA: Diagnosis not present

## 2024-03-31 DIAGNOSIS — M461 Sacroiliitis, not elsewhere classified: Secondary | ICD-10-CM | POA: Insufficient documentation

## 2024-03-31 DIAGNOSIS — Z86711 Personal history of pulmonary embolism: Secondary | ICD-10-CM | POA: Insufficient documentation

## 2024-03-31 DIAGNOSIS — Z7901 Long term (current) use of anticoagulants: Secondary | ICD-10-CM | POA: Insufficient documentation

## 2024-03-31 DIAGNOSIS — Z85850 Personal history of malignant neoplasm of thyroid: Secondary | ICD-10-CM | POA: Insufficient documentation

## 2024-03-31 DIAGNOSIS — Z8616 Personal history of COVID-19: Secondary | ICD-10-CM | POA: Insufficient documentation

## 2024-03-31 DIAGNOSIS — E785 Hyperlipidemia, unspecified: Secondary | ICD-10-CM | POA: Insufficient documentation

## 2024-03-31 NOTE — Progress Notes (Signed)
  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  She generally feels well.  She continues apixaban  anticoagulation.  No bleeding or symptom of recurrent venous thrombosis.  She has a chronic history of bilateral sacral pain.  This has occurred intermittently for years and those generally occur 12-18 months apart.  She takes a Medrol  Dosepak or 1 week course of ibuprofen and the symptoms resolve.  She feels her symptoms are related to sacroiliitis.  The pain is present with standing and resolves with movement.  She has not undergone formal diagnostic evaluation for sacroiliitis.  Objective:  Vital signs in last 24 hours:  Blood pressure 106/75, pulse 80, temperature 98 F (36.7 C), temperature source Temporal, resp. rate 18, height 5' 7 (1.702 m), weight 243 lb (110.2 kg), SpO2 100%.    Resp: Lungs clear bilaterally Cardio: Regular rate and rhythm GI: No hepatosplenomegaly Vascular: No leg edema Musculoskeletal: Tender at the lateral aspect of the bilateral sacrum, no pain with motion at the hips, no tenderness at the trochanters or mid sacrum   Lab Results:  Lab Results  Component Value Date   WBC 4.9 10/15/2023   HGB 15.0 10/15/2023   HCT 43.6 10/15/2023   MCV 97 10/15/2023   PLT 262 10/15/2023   NEUTROABS 3.1 10/15/2023    CMP  Lab Results  Component Value Date   NA 139 10/15/2023   K 4.4 10/15/2023   CL 104 10/15/2023   CO2 19 (L) 10/15/2023   GLUCOSE 87 10/15/2023   BUN 15 10/15/2023   CREATININE 0.90 10/15/2023   CALCIUM  9.3 10/15/2023   PROT 6.1 10/15/2023   ALBUMIN 3.9 10/15/2023   AST 12 10/15/2023   ALT 9 10/15/2023   ALKPHOS 64 10/15/2023   BILITOT 0.5 10/15/2023   GFRNONAA 86 04/30/2019   GFRAA 99 04/30/2019     Medications: I have reviewed the patient's current medications.   Assessment/Plan: Acute bilateral pulmonary emboli November 18, 2017-maintained on indefinite apixaban  anticoagulation Negative hypercoagulation panel 11/18/2017 Elevated D-dimer April 2020 while off of apixaban  for 1 month 2.  Papillary thyroid  cancer, status post a right thyroidectomy 07/24/2018, 1.8 cm, no extrathyroidal extension, negative resection margins, 1 - lymph node 3.  Postsurgical hypothyroidism 4.  COVID-19 infection February 2021 5.  Zoster April 2021-eye involvement 6.  History of menorrhagia-maintained on Mirena  IUD 7.  Hyperlipidemia 8.  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  She is maintained on indefinite apixaban  anticoagulation.  The plan is to continue apixaban .  We discussed the indication for reduced intensity anticoagulation therapy.  She has lost approximately 80 pounds since being diagnosed with the pulmonary emboli.  We discussed data supporting the use of reduced intensity anticoagulation in patients on indefinite anticoagulation therapy.  Dr. Darcy recommended standard dosing when he saw her in 2020.  I will contact him to discuss the indication for reduced intensity apixaban  based on recent data.  I recommended she follow-up with Dr. Perri to evaluate the sacral discomfort if the symptoms recur.  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  Arley Hof, MD  03/31/2024  3:03 PM

## 2024-04-03 ENCOUNTER — Ambulatory Visit: Payer: BC Managed Care – PPO | Admitting: Oncology

## 2024-04-07 ENCOUNTER — Ambulatory Visit (INDEPENDENT_AMBULATORY_CARE_PROVIDER_SITE_OTHER): Admitting: Family Medicine

## 2024-04-07 ENCOUNTER — Encounter (INDEPENDENT_AMBULATORY_CARE_PROVIDER_SITE_OTHER): Payer: Self-pay | Admitting: Family Medicine

## 2024-04-07 ENCOUNTER — Other Ambulatory Visit: Payer: Self-pay | Admitting: *Deleted

## 2024-04-07 VITALS — BP 110/77 | HR 75 | Temp 98.7°F | Ht 67.0 in | Wt 238.0 lb

## 2024-04-07 DIAGNOSIS — R002 Palpitations: Secondary | ICD-10-CM | POA: Diagnosis not present

## 2024-04-07 DIAGNOSIS — Z6841 Body Mass Index (BMI) 40.0 and over, adult: Secondary | ICD-10-CM

## 2024-04-07 DIAGNOSIS — I10 Essential (primary) hypertension: Secondary | ICD-10-CM | POA: Diagnosis not present

## 2024-04-07 DIAGNOSIS — Z6837 Body mass index (BMI) 37.0-37.9, adult: Secondary | ICD-10-CM | POA: Diagnosis not present

## 2024-04-07 DIAGNOSIS — E669 Obesity, unspecified: Secondary | ICD-10-CM

## 2024-04-07 DIAGNOSIS — F4323 Adjustment disorder with mixed anxiety and depressed mood: Secondary | ICD-10-CM | POA: Diagnosis not present

## 2024-04-07 MED ORDER — APIXABAN 5 MG PO TABS: 5.0000 mg | ORAL_TABLET | Freq: Two times a day (BID) | ORAL | 3 refills | Status: AC

## 2024-04-07 NOTE — Progress Notes (Signed)
 Office: [phone]  /  Fax: [phone]  WEIGHT SUMMARY AND BIOMETRICS  Anthropometric Measurements Height: 5' 7 (1.702 m) Weight: 238 lb (108 kg) BMI (Calculated): 37.27 Weight at Last Visit: 239lb Weight Lost Since Last Visit: 1lb Weight Gained Since Last Visit: 0lb Starting Weight: 300lb Total Weight Loss (lbs): 62 lb (28.1 kg) Peak Weight: 320lb   Body Composition  Body Fat %: 47.4 % Fat Mass (lbs): 112.8 lbs Muscle Mass (lbs): 118.8 lbs Total Body Water (lbs): 87.4 lbs Visceral Fat Rating : 12   Other Clinical Data Fasting: No Labs: NO Today's Visit #: 31 Starting Date: 01/09/22 Comments: 1500    Chief Complaint: OBESITY   Discussed the use of AI scribe software for clinical note transcription with the patient, who gave verbal consent to proceed.  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  She adheres to a prescribed diet plan of 1500 calories with a minimum of 90 grams of protein, maintaining compliance 90% of the time. She engages in physical activity twice a week for about 30 minutes, combining cardio and yard work. Over the past month, she has lost one pound. Hunger is well-controlled, but she occasionally experiences symptoms of hypoglycemia during manual work, which she manages by consuming sugar as needed.  She experienced a flare-up of sacroiliitis following a day of extensive walking, which resolved over a couple of weeks. She is now returning to her normal activity levels.  She started a new medication regimen with a 15 mg dose last week and has been experiencing palpitations, particularly at night, which she associates with the medication. She takes metoprolol , usually 12.5 mg twice a day, but sometimes skips the second dose. She has noticed an increase in palpitations since starting the new medication and has a history of palpitations with caffeine  intake. No recent caffeine intake during these episodes. She has a history of a normal cardiac CT scan from about a year to eighteen months ago, showing no calcification. She also recently started taking magnesium for sleep.      PHYSICAL EXAM:  Blood pressure 110/77, pulse 75, temperature 98.7 F (37.1 C), height 5' 7 (1.702 m), weight 238 lb (108 kg), SpO2 99%. Body mass index is 37.28 kg/m.  DIAGNOSTIC DATA REVIEWED:  BMET    Component Value Date/Time   NA 139 10/15/2023 0749   K 4.4 10/15/2023 0749   CL 104 10/15/2023 0749   CO2 19 (L) 10/15/2023 0749   GLUCOSE 87 10/15/2023 0749   GLUCOSE 108 (H) 07/10/2021 0912   BUN 15 10/15/2023 0749   CREATININE 0.90 10/15/2023 0749   CREATININE 0.85 07/10/2021 0912   CALCIUM  9.3 10/15/2023 0749   GFRNONAA 86 04/30/2019 0904   GFRAA 99 04/30/2019 0904   Lab Results  Component Value Date   HGBA1C 5.0 10/15/2023   HGBA1C 5.1 04/30/2019   Lab Results  Component Value Date   INSULIN  14.0 10/15/2023   INSULIN  8.0 01/09/2022   Lab Results  Component Value Date   TSH 1.32 11/26/2023   CBC    Component Value Date/Time   WBC 4.9 10/15/2023 0749   WBC 5.8 07/10/2021 0912   RBC 4.49 10/15/2023 0749   RBC 4.59 07/10/2021 0912   HGB 15.0 10/15/2023 0749   HCT 43.6 10/15/2023 0749   PLT 262 10/15/2023 0749   MCV 97 10/15/2023 0749   MCH 33.4 (H) 10/15/2023 0749  MCH 32.5 07/10/2021 0912   MCHC 34.4 10/15/2023 0749   MCHC 33.9 07/10/2021 0912   RDW 11.7 10/15/2023 0749   Iron Studies No results found for: IRON, TIBC, FERRITIN, IRONPCTSAT Lipid Panel     Component Value Date/Time   CHOL 189 10/15/2023 0749   TRIG 70 10/15/2023 0749   HDL 55 10/15/2023 0749   CHOLHDL 2.6 12/26/2021 1158   LDLCALC 121 (H) 10/15/2023 0749   LDLCALC 72 12/26/2021 1158   Hepatic Function Panel     Component Value Date/Time   PROT 6.1 10/15/2023 0749   ALBUMIN 3.9 10/15/2023 0749   AST 12 10/15/2023 0749   ALT 9 10/15/2023  0749   ALKPHOS 64 10/15/2023 0749   BILITOT 0.5 10/15/2023 0749   BILIDIR 0.1 08/17/2019 0902   IBILI 0.5 08/17/2019 0902      Component Value Date/Time   TSH 1.32 11/26/2023 1342   Nutritional Lab Results  Component Value Date   VD25OH 57.8 10/15/2023   VD25OH 67.2 08/06/2023   VD25OH 66.3 03/26/2023     Assessment and Plan Assessment & Plan Obesity Management is ongoing with a calorie-restricted diet of 1500 calories and a minimum of 90 grams of protein, followed 90% of the time. Exercise includes cardio and yard work twice a week for 30 minutes. Weight loss of 1 pound in the last month. Occasional nausea and low blood sugar symptoms, possibly related to prolonged fasting or manual work. Meal planning and preparation are going well, with a preference for protein pasta and exploration of high-fiber, low-calorie pasta options. Mounjaro  15 mg is being used, with initial challenges in obtaining the medication but now resolved.  - Continue current diet and exercise regimen. - Continue Mounjaro  15 mg as prescribed. - Consider high-fiber, low-calorie pasta options for meal planning.  Palpitations Intermittent palpitations, possibly related to caffeine intake or medication. Metoprolol  is used for blood pressure control and palpitations. Recent increase in palpitations, possibly coinciding with increased Mounjaro  dosage. No history of SVTs.  No increased shortness of breath or feeling lightheaded or dizzy. Palpitations short in duration, 1-2 seconds long. Previous cardiac CT was normal. Palpitations may be related to menopause or anxiety. No immediate concern for serious cardiac issues, but monitoring is advised. - Continue metoprolol  as prescribed. - Holter monitoring and ECG deferred by patient - Monitor palpitations and report any worsening or new symptoms. - Will consider further evaluation if palpitations persist or worsen.  Essential hypertension Blood pressure is well-controlled  with metoprolol . No episodes of lightheadedness or dizziness reported. Metoprolol  also aids in controlling palpitations. - Continue metoprolol  as prescribed.     Gioia was informed of the importance of frequent follow up visits to maximize her success with intensive lifestyle modifications for her obesity and obesity related health conditions as recommended by USPSTF and CMS guidelines   Louann Penton, MD

## 2024-04-20 ENCOUNTER — Telehealth: Payer: Self-pay | Admitting: Internal Medicine

## 2024-04-20 ENCOUNTER — Encounter: Payer: Self-pay | Admitting: Internal Medicine

## 2024-04-20 NOTE — Telephone Encounter (Signed)
 Dr. Debby returning call. Transferred to [facility]

## 2024-04-21 DIAGNOSIS — F4323 Adjustment disorder with mixed anxiety and depressed mood: Secondary | ICD-10-CM | POA: Diagnosis not present

## 2024-04-22 ENCOUNTER — Ambulatory Visit: Admitting: Internal Medicine

## 2024-04-22 ENCOUNTER — Encounter: Payer: Self-pay | Admitting: Internal Medicine

## 2024-04-22 VITALS — BP 110/80 | HR 74 | Ht 67.0 in

## 2024-04-22 DIAGNOSIS — Z86711 Personal history of pulmonary embolism: Secondary | ICD-10-CM

## 2024-04-22 DIAGNOSIS — Z7901 Long term (current) use of anticoagulants: Secondary | ICD-10-CM | POA: Diagnosis not present

## 2024-04-22 DIAGNOSIS — M5431 Sciatica, right side: Secondary | ICD-10-CM | POA: Diagnosis not present

## 2024-04-22 DIAGNOSIS — E890 Postprocedural hypothyroidism: Secondary | ICD-10-CM | POA: Diagnosis not present

## 2024-04-22 MED ORDER — METHYLPREDNISOLONE 4 MG PO TABS: ORAL_TABLET | ORAL | 0 refills | Status: DC

## 2024-04-22 MED ORDER — HYDROCODONE-ACETAMINOPHEN 10-325 MG PO TABS: 1.0000 | ORAL_TABLET | Freq: Three times a day (TID) | ORAL | 0 refills | Status: AC | PRN

## 2024-04-22 MED ORDER — CYCLOBENZAPRINE HCL 10 MG PO TABS: 10.0000 mg | ORAL_TABLET | Freq: Three times a day (TID) | ORAL | 1 refills | Status: AC | PRN

## 2024-04-22 NOTE — Progress Notes (Signed)
   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 The pain has been persistent despite trying some OTC pain reliever medication. The pain is annoying especially walking around the hospital on rounds. 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 Last PT visit was 08/21/22.   PMH: Hx of mild pure hypercholesterolemia treated with low dose statin. Sees Dr. Verdon at [facility]. Hx of papillary thyroid  carcinoma. Hx Vitamin D  deficiency treated with weekly high dose Vitamin D  (Drisdol ) 50,000 units weekly. Hx of saddle embolus treated with Eliquis .Saw Dr. Darcy at [facility] who recommended chronic anticoagulation. Hx mononucleosis March 2020. Remote hx of fractured ankle, fractured wrist, and knee surgery. Has lost some 60 pounds with [facility], diet and exercise. Hx papillary thyroid  cancer s/p right thyroid  lobectomy by Dr. Eletha with post surgical hypothyroidism. Hx Herpes zoster opthalmicus April 2021.  Social Hx: single, social alcohol consumption.non-smoker.  FHx: Parents and twin brothers are healthy.  GYN is Dr. Ronal Elvie Pinal. Hx  ASCUS +HR HPV. Mirena  IUD placed in 2020. Social Hx: single. Resides alone. nonsmoker  Review of Systems right sided back pain     Objective:   Physical Exam   VS reviewed. Lupe is warm and dry. Straight leg raising posotive at 90 degrees on the right. Muscle strength in RLE is normal and sensation in RLE is normal.      Assessment & Plan:  Right sided sciatica- new after prolonged walking on trip to [place] Hx of sciatica- resolved with PT Chronic anticoagulation for hx of bilateral PE June 2019 Hypothyroidism treated  with thyroid  replacement s/p right thyroidectomy by Dr. Eletha for papillary thyroid  carcinoma.  Plan: Medrol  4 mg 6 day Dosepack to take in tapering course as directed.Norco 10/325 to take sparingly for back pain.May take Flexeril 10 mg up to 3 times daily if needed for back pain/spasm.  Referral to PT at [facility] which pt found helpful previously.If not responding to PT, consider MRI of LS spine.

## 2024-04-22 NOTE — Patient Instructions (Addendum)
 Referral made to PT at [facility] where pt has been before. Take Medrol  in tapering course as directed 6-5-4-3-2-1 taper. May take Norco 10/325 every 8 hours sparing for pain (one half to one tablet). Flexeril 10 mg up to 3 times daily as needed.

## 2024-04-27 ENCOUNTER — Encounter (HOSPITAL_BASED_OUTPATIENT_CLINIC_OR_DEPARTMENT_OTHER): Payer: Self-pay

## 2024-05-05 DIAGNOSIS — F4323 Adjustment disorder with mixed anxiety and depressed mood: Secondary | ICD-10-CM | POA: Diagnosis not present

## 2024-05-11 ENCOUNTER — Other Ambulatory Visit: Payer: Self-pay | Admitting: Internal Medicine

## 2024-05-11 ENCOUNTER — Ambulatory Visit (INDEPENDENT_AMBULATORY_CARE_PROVIDER_SITE_OTHER): Payer: Self-pay | Admitting: Family Medicine

## 2024-05-11 ENCOUNTER — Encounter (INDEPENDENT_AMBULATORY_CARE_PROVIDER_SITE_OTHER): Payer: Self-pay | Admitting: Family Medicine

## 2024-05-11 VITALS — BP 93/68 | HR 69 | Temp 98.0°F | Ht 67.0 in | Wt 233.0 lb

## 2024-05-11 DIAGNOSIS — E785 Hyperlipidemia, unspecified: Secondary | ICD-10-CM | POA: Diagnosis not present

## 2024-05-11 DIAGNOSIS — E890 Postprocedural hypothyroidism: Secondary | ICD-10-CM

## 2024-05-11 DIAGNOSIS — R632 Polyphagia: Secondary | ICD-10-CM

## 2024-05-11 DIAGNOSIS — F3289 Other specified depressive episodes: Secondary | ICD-10-CM

## 2024-05-11 DIAGNOSIS — F32A Depression, unspecified: Secondary | ICD-10-CM

## 2024-05-11 DIAGNOSIS — Z1231 Encounter for screening mammogram for malignant neoplasm of breast: Secondary | ICD-10-CM

## 2024-05-11 DIAGNOSIS — E559 Vitamin D deficiency, unspecified: Secondary | ICD-10-CM

## 2024-05-11 DIAGNOSIS — E88819 Insulin resistance, unspecified: Secondary | ICD-10-CM

## 2024-05-11 DIAGNOSIS — R7303 Prediabetes: Secondary | ICD-10-CM

## 2024-05-11 DIAGNOSIS — E7800 Pure hypercholesterolemia, unspecified: Secondary | ICD-10-CM

## 2024-05-11 DIAGNOSIS — Z6836 Body mass index (BMI) 36.0-36.9, adult: Secondary | ICD-10-CM

## 2024-05-11 DIAGNOSIS — E669 Obesity, unspecified: Secondary | ICD-10-CM

## 2024-05-11 MED ORDER — METFORMIN HCL 500 MG PO TABS: 500.0000 mg | ORAL_TABLET | Freq: Two times a day (BID) | ORAL | 0 refills | Status: DC

## 2024-05-11 MED ORDER — TIRZEPATIDE-WEIGHT MANAGEMENT 15 MG/0.5ML ~~LOC~~ SOLN: 15.0000 mg | SUBCUTANEOUS | 1 refills | Status: DC

## 2024-05-11 MED ORDER — VITAMIN D (ERGOCALCIFEROL) 1.25 MG (50000 UNIT) PO CAPS: 50000.0000 [IU] | ORAL_CAPSULE | ORAL | 0 refills | Status: AC

## 2024-05-11 MED ORDER — BUPROPION HCL ER (SR) 150 MG PO TB12: 150.0000 mg | ORAL_TABLET | Freq: Two times a day (BID) | ORAL | 0 refills | Status: DC

## 2024-05-11 MED ORDER — ROSUVASTATIN CALCIUM 5 MG PO TABS: 5.0000 mg | ORAL_TABLET | Freq: Every day | ORAL | 0 refills | Status: DC

## 2024-05-11 NOTE — Progress Notes (Signed)
 Office: [phone]  /  Fax: [phone]  WEIGHT SUMMARY AND BIOMETRICS  Anthropometric Measurements Height: 5' 7 (1.702 m) Weight: 233 lb (105.7 kg) BMI (Calculated): 36.48 Weight at Last Visit: 238 lb Weight Lost Since Last Visit: 5 lb Weight Gained Since Last Visit: 0 Starting Weight: 300 lb Total Weight Loss (lbs): 67 lb (30.4 kg) Peak Weight: 320 lb   Body Composition  Body Fat %: 45.3 % Fat Mass (lbs): 105.8 lbs Muscle Mass (lbs): 121.2 lbs Total Body Water (lbs): 83.4 lbs Visceral Fat Rating : 12   Other Clinical Data Fasting: yes Labs: yes Today's Visit #: 32 Starting Date: 01/09/22 Comments: 1500+85    Chief Complaint: OBESITY   History of Present Illness Penny Wanda Ned, 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  She adheres to a dietary plan targeting 1500 calories and 85 grams of protein daily, achieving this 75% of the time. She is increasing her intake of fruits and vegetables, meets protein goals, hydrates adequately, does not skip meals, and sleeps 7 to 9 hours per night. She exercises by walking outdoors for 20 minutes two days per week. She has lost 5 pounds in the last month and a total of 67 pounds since starting the program.  She requests a refill for her Crestor  5 mg prescription.  She has vitamin D  deficiency and is on ergocalciferol  50,000 IU per week, which has been stable. She requests a refill for this medication.  She is on bupropion  SR 150 mg twice a day for emotional behaviors and is stable, requesting a refill.  She has prediabetes and is on metformin  500 mg twice a day, working on decreasing her intake of simple carbohydrates.  She is on Zepbound  15 mg for polyphagia and requests a refill.  She experienced a recent episode of sciatic-type pain that resolved without steroids, though she took muscle relaxers. She has not had an MRI but is  scheduled to start physical therapy this week.  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 She hydrates well most days, though it is challenging on long operating days.  She is on Synthroid  following a partial thyroidectomy for thyroid  cancer and reports palpitations, which she associates with her thyroid  levels. She is interested in checking her thyroid  levels as her mother experienced similar symptoms when her thyroid  levels were high.      PHYSICAL EXAM:  Blood pressure 93/68, pulse 69, temperature 98 F (36.7 C), height 5' 7 (1.702 m), weight 233 lb (105.7 kg), SpO2 100%. Body mass index is 36.49 kg/m.  DIAGNOSTIC DATA REVIEWED:  BMET    Component Value Date/Time   NA 139 10/15/2023 0749   K 4.4 10/15/2023 0749   CL 104 10/15/2023 0749   CO2 19 (L) 10/15/2023 0749   GLUCOSE 87 10/15/2023 0749   GLUCOSE 108 (H) 07/10/2021 0912   BUN 15 10/15/2023 0749   CREATININE 0.90 10/15/2023 0749   CREATININE 0.85 07/10/2021 0912   CALCIUM  9.3 10/15/2023 0749   GFRNONAA 86 04/30/2019 0904   GFRAA 99 04/30/2019 0904   Lab Results  Component Value Date   HGBA1C 5.0 10/15/2023   HGBA1C 5.1 04/30/2019   Lab Results  Component Value Date   INSULIN  14.0 10/15/2023   INSULIN  8.0 01/09/2022   Lab Results  Component Value Date   TSH 1.32 11/26/2023   CBC  Component Value Date/Time   WBC 4.9 10/15/2023 0749   WBC 5.8 07/10/2021 0912   RBC 4.49 10/15/2023 0749   RBC 4.59 07/10/2021 0912   HGB 15.0 10/15/2023 0749   HCT 43.6 10/15/2023 0749   PLT 262 10/15/2023 0749   MCV 97 10/15/2023 0749   MCH 33.4 (H) 10/15/2023 0749   MCH 32.5 07/10/2021 0912   MCHC 34.4 10/15/2023 0749   MCHC 33.9 07/10/2021 0912   RDW 11.7 10/15/2023 0749   Iron Studies No results found for: IRON, TIBC, FERRITIN, IRONPCTSAT Lipid Panel     Component Value Date/Time   CHOL 189 10/15/2023 0749   TRIG 70  10/15/2023 0749   HDL 55 10/15/2023 0749   CHOLHDL 2.6 12/26/2021 1158   LDLCALC 121 (H) 10/15/2023 0749   LDLCALC 72 12/26/2021 1158   Hepatic Function Panel     Component Value Date/Time   PROT 6.1 10/15/2023 0749   ALBUMIN 3.9 10/15/2023 0749   AST 12 10/15/2023 0749   ALT 9 10/15/2023 0749   ALKPHOS 64 10/15/2023 0749   BILITOT 0.5 10/15/2023 0749   BILIDIR 0.1 08/17/2019 0902   IBILI 0.5 08/17/2019 0902      Component Value Date/Time   TSH 1.32 11/26/2023 1342   Nutritional Lab Results  Component Value Date   VD25OH 57.8 10/15/2023   VD25OH 67.2 08/06/2023   VD25OH 66.3 03/26/2023     Assessment and Plan Assessment & Plan Obesity Management is ongoing with a focus on dietary modifications and exercise. She adheres to a 1500 calorie diet with 85 grams of protein, meeting these criteria about 75% of the time. She has lost 5 pounds in the last month and a total of 67 pounds since starting the program. She is working on increasing fruit and vegetable intake and maintains adequate hydration and sleep. Engages in walking for exercise 20 minutes, two days per week. - Continue current dietary plan with 1500 calories and 85 grams of protein. - Encouraged increased intake of fruits and vegetables. - Continue walking for exercise 20 minutes, two days per week.  Hyperlipidemia Managed with dietary changes, exercise, and weight loss. She is on Crestor  5 mg and requests a refill. - Refilled Crestor  5 mg. - Continue diet, exercise and weight loss as discussed today as an important part of the treatment plan   Vitamin D  deficiency Managed with ergocalciferol  50,000 IU weekly. She is stable on this regimen and requests a refill. - Refilled ergocalciferol  50,000 IU weekly.  Prediabetes Managed with metformin  500 mg twice a day and dietary modifications to decrease simple carbohydrates. - Continue metformin  500 mg twice a day. - Continue dietary modifications to decrease simple  carbohydrates.  Polyphagia Managed with Zepbound  15 mg. She is doing well following her diet, exercise, and weight loss plan and requests a refill. - Refilled Zepbound  15 mg.  Depression Managed with bupropion  SR 150 mg twice a day. She is stable on this regimen and requests a refill. - Refilled bupropion  SR 150 mg twice a day.  Postprocedural hypothyroidism, status post partial thyroidectomy for thyroid  cancer Postprocedural hypothyroidism is managed with Synthroid . She reports palpitations and is concerned about thyroid  levels. A thyroid  panel is planned to assess current levels. - Ordered thyroid  panel.  Sciatic-type back pain Recent episode of sciatic-type back pain has resolved. She did not require steroids and used muscle relaxers. An MRI was ordered but not yet completed due to insurance concerns. She is starting physical therapy this week. - Continue  with physical therapy. - Continue to work on weight loss to take more pressure off joints      Patients who are on anti-obesity medications are counseled on the importance of maintaining healthy lifestyle habits, including balanced nutrition, regular physical activity, and behavioral modifications,  Medication is an adjunct to, not a replacement for, lifestyle changes and that the long-term success and weight maintenance depend on continued adherence to these strategies.   Florence was informed of the importance of frequent follow up visits to maximize her success with intensive lifestyle modifications for her obesity and obesity related health conditions as recommended by USPSTF and CMS guidelines   Louann Penton, MD

## 2024-05-12 ENCOUNTER — Other Ambulatory Visit: Payer: Self-pay

## 2024-05-12 ENCOUNTER — Ambulatory Visit: Attending: Internal Medicine | Admitting: Rehabilitative and Restorative Service Providers"

## 2024-05-12 ENCOUNTER — Encounter: Payer: Self-pay | Admitting: Rehabilitative and Restorative Service Providers"

## 2024-05-12 DIAGNOSIS — M533 Sacrococcygeal disorders, not elsewhere classified: Secondary | ICD-10-CM | POA: Diagnosis not present

## 2024-05-12 DIAGNOSIS — R293 Abnormal posture: Secondary | ICD-10-CM | POA: Diagnosis not present

## 2024-05-12 DIAGNOSIS — M6281 Muscle weakness (generalized): Secondary | ICD-10-CM | POA: Diagnosis not present

## 2024-05-12 DIAGNOSIS — R252 Cramp and spasm: Secondary | ICD-10-CM | POA: Diagnosis not present

## 2024-05-12 DIAGNOSIS — M5431 Sciatica, right side: Secondary | ICD-10-CM | POA: Insufficient documentation

## 2024-05-12 NOTE — Therapy (Signed)
 OUTPATIENT PHYSICAL THERAPY THORACOLUMBAR EVALUATION   Patient Name: Penny Mcphie, MD MRN: [id number] DOB:Sep 12, 1980, 43 y.o., female Today's Date: 05/12/2024  END OF SESSION:  PT End of Session - 05/12/24 1449     Visit Number 1    Date for Recertification  07/10/24    Authorization Type BC/BS    PT Start Time 1445    PT Stop Time 1525    PT Time Calculation (min) 40 min    Activity Tolerance Patient tolerated treatment well    Behavior During Therapy WFL for tasks assessed/performed          Past Medical History:  Diagnosis Date   Back pain    Clotting disorder    GERD (gastroesophageal reflux disease)    resolved    H/O blood clots    High cholesterol    Hypertension    Hypothyroidism    Joint pain    PONV (postoperative nausea and vomiting)    mild nausea responsive to meds    Pulmonary embolism (HCC) 10/2017   Thyroid  carcinoma (HCC)    Vitamin D  deficiency    Past Surgical History:  Procedure Laterality Date   broken wrist     KNEE SURGERY     arthroscopy    THYROIDECTOMY Right 07/24/2018   Procedure: RIGHT THYROID  LOBECTOMY  AND ISTHMUS;  Surgeon: Eletha Boas, MD;  Location: [facility];  Service: General;  Laterality: Right;   Patient Active Problem List   Diagnosis Date Noted   Polyphagia 10/15/2023   B12 deficiency 10/15/2023   Hypocalcemia 10/15/2023   BMI 39.0-39.9,adult 05/07/2023   Prediabetes 01/22/2023   Insulin  resistance 07/17/2022   BMI 40.0-44.9, adult (HCC) 07/17/2022   Obesity, Beginning BMI 46.99 07/17/2022   Sacroiliitis 06/19/2022   Stress 04/24/2022   Hypothyroidism 04/02/2022   Depression 03/20/2022   Vitamin D  deficiency 03/06/2022   History of pulmonary embolism 11/28/2021   Pure hypercholesterolemia 04/27/2021   Essential hypertension 04/27/2021   Shingles of eyelid 02/22/2020   Postsurgical hypothyroidism 10/28/2018   Papillary thyroid  carcinoma (HCC) 10/28/2018    PCP: Perri Ronal PARAS, MD  REFERRING  PROVIDER: Perri Ronal PARAS, MD  REFERRING DIAG: M54.31 (ICD-10-CM) - Sciatica of right side  Rationale for Evaluation and Treatment: Rehabilitation  THERAPY DIAG:  Muscle weakness (generalized)  Cramp and spasm  SI (sacroiliac) pain  Abnormal posture  ONSET DATE: early October 2025  SUBJECTIVE:                                                                                                                                                                                           SUBJECTIVE  STATEMENT: Patient went to [place], [place] at the beginning of October and she started having the SI pain on her right side like she did in early 2024.  Patient then went to [facility] at the beginning of November, and with the increased walking around, her pain seemed to decrease.  Patient is hoping for a HEP that she can do at home to prevent the pain from happening again.  Patient additionally reports that she has been having some tightness in her pectorals and sometimes has some soreness.  PERTINENT HISTORY:  bilat pulmonary embolism on 11/18/2017, thyroid  cancer s/p right thyroidectomy 07/24/2018, elevated BMI, HTN   PAIN:  Are you having pain? Yes: NPRS scale: 1-7/10 Pain location: right SI region Pain description: sharp/radiating initially, otherwise dull Aggravating factors: unknown Relieving factors: unknown  PRECAUTIONS: None  RED FLAGS: None   WEIGHT BEARING RESTRICTIONS: No  FALLS:  Has patient fallen in last 6 months? No  LIVING ENVIRONMENT: Lives with: lives alone Lives in: House/apartment Stairs: two level home Has following equipment at home: Single point cane and Crutches  OCCUPATION: Careers Adviser, does robotic surgery   PLOF: Independent, Vocation/Vocational requirements: sitting with robotic surgery, and Leisure: gardening  PATIENT GOALS: To have a long term prevention plan for the pain.  NEXT MD VISIT: as needed  OBJECTIVE:  Note: Objective measures were completed at  Evaluation unless otherwise noted.  DIAGNOSTIC FINDINGS:  N/A  PATIENT SURVEYS:  Modified Oswestry:  MODIFIED OSWESTRY DISABILITY SCALE  Score Date: 05/12/2024  Pain intensity 3 =  Pain medication provides me with moderate relief from pain.  2. Personal care (washing, dressing, etc.) 2 =  It is painful to take care of myself, and I am slow and careful.  3. Lifting 2 = Pain prevents me from lifting heavy weights off the floor,but I can manage if the weights are conveniently positioned (e.g. on a table)  4. Walking 0 = Pain does not prevent me from walking any distance  5. Sitting 2 =  Pain prevents me from sitting more than 1 hour.  6. Standing 1 =  I can stand as long as I want but, it increases my pain.  7. Sleeping 2 =  Even when I take pain medication, I sleep less than 6 hours  8. Social Life 2 = Pain prevents me from participating in more energetic activities (eg. sports, dancing).  9. Traveling 1 =  I can travel anywhere, but it increases my pain.  10. Employment/ Homemaking 1 = My normal homemaking/job activities increase my pain, but I can still perform all that is required of me  Total 16/50 = 32%   Interpretation of scores: Score Category Description  0-20% Minimal Disability The patient can cope with most living activities. Usually no treatment is indicated apart from advice on lifting, sitting and exercise  21-40% Moderate Disability The patient experiences more pain and difficulty with sitting, lifting and standing. Travel and social life are more difficult and they may be disabled from work. Personal care, sexual activity and sleeping are not grossly affected, and the patient can usually be managed by conservative means  41-60% Severe Disability Pain remains the main problem in this group, but activities of daily living are affected. These patients require a detailed investigation  61-80% Crippled Back pain impinges on all aspects of the patient's life. Positive intervention  is required  81-100% Bed-bound These patients are either bed-bound or exaggerating their symptoms  Bluford BRAVO, [name] [name], et al. Surgery versus  conservative management of stable thoracolumbar fracture: the PRESTO feasibility RCT. [place] ([place]): [employer]; 2021 Nov. [facility] Technology Assessment, No. 25.62.) Appendix 3, Oswestry Disability Index category descriptors. Available from: Findjewelers.cz  Minimally Clinically Important Difference (MCID) = 12.8%  COGNITION: Overall cognitive status: Within functional limits for tasks assessed     SENSATION: Denies any numbness and tingling currently  MUSCLE LENGTH: Hamstrings: tightness bilat  POSTURE: rounded shoulders and forward head  PALPATION: Tightness and muscle spasms noted bilaterally, worse on right side lumbar paraspinals  LUMBAR ROM:   Eval: Tightness with at least a 25% restriction (more stiffness than pain)  LOWER EXTREMITY ROM:     Eval:  WFL  LOWER EXTREMITY MMT:    Eval: Right hip strength of 4/5 Left hip strength of 4+ to 5/5 Quads are WFL  LUMBAR SPECIAL TESTS:  Slump test: Negative  FUNCTIONAL TESTS:  Eval: 30 seconds chair stand test:  13 complete stands Forward T:  noted weakness with hip instability  GAIT: Distance walked: >500 ft Assistive device utilized: None Level of assistance: Complete Independence Comments: Patient reports that no pain with walking, more with standing  TREATMENT DATE:  05/12/2024: Issued and reviewed HEP and provided with blue theraband Reviewed role of PT and goals   PATIENT EDUCATION:  Education details: Issued HEP Person educated: Patient Education method: Explanation, Demonstration, and Handouts Education comprehension: verbalized understanding and returned demonstration  HOME EXERCISE PROGRAM: Access Code: [email or web address] URL: [email or web address] Date: 05/12/2024 Prepared by: Jarrell  Danikah Budzik  Exercises - Seated Piriformis Stretch  - 2 x daily - 4 x weekly - 5 reps - Half Kneeling Hip Flexor Stretch  - 2 x daily - 4 x weekly - 10 sec hold - Half Kneeling Hip Flexor Stretch with Chair  - 2 x daily - 4 x weekly - 10 seconds hold - Standing Hamstring Stretch on Chair  - 1 x daily - 4 x weekly - 2 reps - 20 sec hold - Forward T  - 1 x daily - 4 x weekly - 1-2 sets - 10 reps - Side Stepping with Resistance at Thighs  - 1 x daily - 3 x weekly - 10 reps - Standing Anti-Rotation Press with Anchored Resistance  - 1 x daily - 3 x weekly - 10 reps - Bridge  - 1 x daily - 3 x weekly - 2 sets - 10 reps - Standing Low Trap Setting with Resistance at Wall  - 1 x daily - 4 x weekly - 1-2 sets - 10 reps - Doorway Pec Stretch at 90 Degrees Abduction  - 1 x daily - 4 x weekly - 2 reps - 20 sec hold  ASSESSMENT:  CLINICAL IMPRESSION: Patient is a 43 y.o. female who was seen today for physical therapy evaluation and treatment for right sciatica pain.  Patient is known to this PT from a previous treatment where she had similar right sided SI pain.  Patient states that she would like an exercise program that she could continue at home to decrease the risk of this pain happening again.  At time of last evaluation, patient was able to complete 14 stands in 30 seconds, today, she could complete 13.  Patient presents with right sided hip weakness, pec tightness, hamstring tightness, abnormal posture, and difficulty performing functional tasks.  Patient would benefit from skilled PT to progress towards goal related activities.  OBJECTIVE IMPAIRMENTS: decreased strength, increased muscle spasms, impaired flexibility, impaired sensation, postural dysfunction, and pain.   ACTIVITY LIMITATIONS:  carrying, lifting, bending, standing, and squatting  PARTICIPATION LIMITATIONS: community activity, occupation, and yard work  PERSONAL FACTORS: Past/current experiences and Time since onset of  injury/illness/exacerbation are also affecting patient's functional outcome.   REHAB POTENTIAL: Good  CLINICAL DECISION MAKING: Stable/uncomplicated  EVALUATION COMPLEXITY: Low   GOALS: Goals reviewed with patient? Yes  SHORT TERM GOALS: Target date: 06/06/2024  Pt will be independent with initial HEP.  Baseline: Goal status: INITIAL  2.   Patient will report at least a 25% improvement since starting PT.  Baseline:  Goal status: INITIAL    LONG TERM GOALS: Target date: 07/10/2024  Patient will be independent with advanced HEP to allow for self progression after discharge. Baseline:  Goal status: INITIAL  2.  Patient will be able to work a shift of work without increased pain.  Baseline:  Goal status: INITIAL  3.  Patient will increase LE strength to [facility] to allow her to complete a forward T without hip compensations. Baseline:  Goal status: INITIAL  4.  Patient will improve modified Oswestry to no greater than 20% to demonstrate improvements in functional mobility. Baseline: 32% Goal status: INITIAL  5.  Patient will demonstrate improved body mechanics and posture and report at least a 50% decrease in shoulder pain. Baseline:  Goal status: INITIAL    PLAN:  PT FREQUENCY: 1-2x/week  PT DURATION: 8 weeks  PLANNED INTERVENTIONS: [id number]- PT Re-evaluation, 97750- Physical Performance Testing, 97110-Therapeutic exercises, 97530- Therapeutic activity, [id number]- Neuromuscular re-education, 97535- Self Care, [id number]- Manual therapy, [id number]- Gait training, [phone]- Canalith repositioning, [id number]- Aquatic Therapy, [phone]- Electrical stimulation (unattended), [phone]- Electrical stimulation (manual), [id number]- Vasopneumatic device, [id number]- Ultrasound, [id number]- Traction (mechanical), [id number]- Ionotophoresis 4mg /ml Dexamethasone , [id number] (1-2 muscles), [id number] (3+ muscles)- Dry Needling, Patient/Family education, Balance training, Stair training, Taping, Joint mobilization, Joint manipulation, Spinal  manipulation, Spinal mobilization, Vestibular training, Cryotherapy, and Moist heat.  PLAN FOR NEXT SESSION: Assess and progress HEP as indicated, strengthening, flexibility, manual/dry needling as indicated    Jarrell Laming, PT, DPT 05/12/24, 3:35 PM  [facility] [address], [address] [place], [place] [address] Phone # [phone] Fax [phone]

## 2024-05-13 ENCOUNTER — Other Ambulatory Visit: Payer: Self-pay | Admitting: Internal Medicine

## 2024-06-02 ENCOUNTER — Ambulatory Visit

## 2024-06-09 ENCOUNTER — Ambulatory Visit
Admission: RE | Admit: 2024-06-09 | Discharge: 2024-06-09 | Disposition: A | Discharge status: Home / Self Care | Source: Ambulatory Visit

## 2024-06-09 ENCOUNTER — Ambulatory Visit: Attending: Internal Medicine

## 2024-06-09 DIAGNOSIS — R252 Cramp and spasm: Secondary | ICD-10-CM | POA: Diagnosis not present

## 2024-06-09 DIAGNOSIS — E88819 Insulin resistance, unspecified: Secondary | ICD-10-CM | POA: Diagnosis not present

## 2024-06-09 DIAGNOSIS — E7800 Pure hypercholesterolemia, unspecified: Secondary | ICD-10-CM | POA: Diagnosis not present

## 2024-06-09 DIAGNOSIS — Z1231 Encounter for screening mammogram for malignant neoplasm of breast: Secondary | ICD-10-CM | POA: Diagnosis not present

## 2024-06-09 DIAGNOSIS — R293 Abnormal posture: Secondary | ICD-10-CM | POA: Insufficient documentation

## 2024-06-09 DIAGNOSIS — M6281 Muscle weakness (generalized): Secondary | ICD-10-CM | POA: Diagnosis not present

## 2024-06-09 DIAGNOSIS — M533 Sacrococcygeal disorders, not elsewhere classified: Secondary | ICD-10-CM | POA: Diagnosis not present

## 2024-06-09 DIAGNOSIS — E890 Postprocedural hypothyroidism: Secondary | ICD-10-CM | POA: Diagnosis not present

## 2024-06-09 DIAGNOSIS — E559 Vitamin D deficiency, unspecified: Secondary | ICD-10-CM | POA: Diagnosis not present

## 2024-06-09 NOTE — Patient Instructions (Signed)
 Seated HS stretch Seated piriformis stretch Sidestepping with blue band; resitance at thighs

## 2024-06-09 NOTE — Therapy (Signed)
 " OUTPATIENT PHYSICAL THERAPY THORACOLUMBAR TREATMENT   Patient Name: Penny Fobes, MD MRN: [id number] DOB:09-08-1980, 43 y.o., female Today's Date: 06/09/2024  END OF SESSION:  PT End of Session - 06/09/24 1458     Visit Number 2    Date for Recertification  07/10/24    Authorization Type BC/BS    PT Start Time 1500    PT Stop Time 1550    PT Time Calculation (min) 50 min    Activity Tolerance Patient tolerated treatment well    Behavior During Therapy WFL for tasks assessed/performed          Past Medical History:  Diagnosis Date   Back pain    Clotting disorder    GERD (gastroesophageal reflux disease)    resolved    H/O blood clots    High cholesterol    Hypertension    Hypothyroidism    Joint pain    PONV (postoperative nausea and vomiting)    mild nausea responsive to meds    Pulmonary embolism (HCC) 10/2017   Thyroid  carcinoma (HCC)    Vitamin D  deficiency    Past Surgical History:  Procedure Laterality Date   broken wrist     KNEE SURGERY     arthroscopy    THYROIDECTOMY Right 07/24/2018   Procedure: RIGHT THYROID  LOBECTOMY  AND ISTHMUS;  Surgeon: Penny Boas, MD;  Location: [facility];  Service: General;  Laterality: Right;   Patient Active Problem List   Diagnosis Date Noted   Polyphagia 10/15/2023   B12 deficiency 10/15/2023   Hypocalcemia 10/15/2023   BMI 39.0-39.9,adult 05/07/2023   Prediabetes 01/22/2023   Insulin  resistance 07/17/2022   BMI 40.0-44.9, adult (HCC) 07/17/2022   Obesity, Beginning BMI 46.99 07/17/2022   Sacroiliitis 06/19/2022   Stress 04/24/2022   Hypothyroidism 04/02/2022   Depression 03/20/2022   Vitamin D  deficiency 03/06/2022   History of pulmonary embolism 11/28/2021   Pure hypercholesterolemia 04/27/2021   Essential hypertension 04/27/2021   Shingles of eyelid 02/22/2020   Postsurgical hypothyroidism 10/28/2018   Papillary thyroid  carcinoma (HCC) 10/28/2018    PCP: Penny Ronal PARAS, MD  REFERRING PROVIDER:  Perri Ronal PARAS, MD  REFERRING DIAG: M54.31 (ICD-10-CM) - Sciatica of right side  Rationale for Evaluation and Treatment: Rehabilitation  THERAPY DIAG:  Muscle weakness (generalized)  Cramp and spasm  SI (sacroiliac) pain  Abnormal posture  ONSET DATE: early October 2025  SUBJECTIVE:  SUBJECTIVE STATEMENT: No present pain. I have been doing the exercises, but when I do I get muscle soreness for several days. My quads feel tight. I noticed that when doing the stretches. I get the exercises in about 2x's per week.  EVAL Patient went to [place], [place] at the beginning of October and she started having the SI pain on her right side like she did in early 2024.  Patient then went to [facility] at the beginning of November, and with the increased walking around, her pain seemed to decrease.  Patient is hoping for a HEP that she can do at home to prevent the pain from happening again.  Patient additionally reports that she has been having some tightness in her pectorals and sometimes has some soreness.  PERTINENT HISTORY:  bilat pulmonary embolism on 11/18/2017, thyroid  cancer s/p right thyroidectomy 07/24/2018, elevated BMI, HTN   PAIN:  Are you having pain? Yes: NPRS scale: 1-7/10 Pain location: right SI region Pain description: sharp/radiating initially, otherwise dull Aggravating factors: unknown Relieving factors: unknown  PRECAUTIONS: None  RED FLAGS: None   WEIGHT BEARING RESTRICTIONS: No  FALLS:  Has patient fallen in last 6 months? No  LIVING ENVIRONMENT: Lives with: lives alone Lives in: House/apartment Stairs: two level home Has following equipment at home: Single point cane and Crutches  OCCUPATION: Careers Adviser, does robotic surgery   PLOF: Independent, Vocation/Vocational requirements:  sitting with robotic surgery, and Leisure: gardening  PATIENT GOALS: To have a long term prevention plan for the pain.  NEXT MD VISIT: as needed  OBJECTIVE:  Note: Objective measures were completed at Evaluation unless otherwise noted.  DIAGNOSTIC FINDINGS:  N/A  PATIENT SURVEYS:  Modified Oswestry:  MODIFIED OSWESTRY DISABILITY SCALE  Score Date: 05/12/2024  Pain intensity 3 =  Pain medication provides me with moderate relief from pain.  2. Personal care (washing, dressing, etc.) 2 =  It is painful to take care of myself, and I am slow and careful.  3. Lifting 2 = Pain prevents me from lifting heavy weights off the floor,but I can manage if the weights are conveniently positioned (e.g. on a table)  4. Walking 0 = Pain does not prevent me from walking any distance  5. Sitting 2 =  Pain prevents me from sitting more than 1 hour.  6. Standing 1 =  I can stand as long as I want but, it increases my pain.  7. Sleeping 2 =  Even when I take pain medication, I sleep less than 6 hours  8. Social Life 2 = Pain prevents me from participating in more energetic activities (eg. sports, dancing).  9. Traveling 1 =  I can travel anywhere, but it increases my pain.  10. Employment/ Homemaking 1 = My normal homemaking/job activities increase my pain, but I can still perform all that is required of me  Total 16/50 = 32%   Interpretation of scores: Score Category Description  0-20% Minimal Disability The patient can cope with most living activities. Usually no treatment is indicated apart from advice on lifting, sitting and exercise  21-40% Moderate Disability The patient experiences more pain and difficulty with sitting, lifting and standing. Travel and social life are more difficult and they may be disabled from work. Personal care, sexual activity and sleeping are not grossly affected, and the patient can usually be managed by conservative means  41-60% Severe Disability Pain remains the main  problem in this group, but activities of daily living are affected. These patients require  a detailed investigation  61-80% Crippled Back pain impinges on all aspects of the patients life. Positive intervention is required  81-100% Bed-bound These patients are either bed-bound or exaggerating their symptoms  Penny Hancock, et al. Surgery versus conservative management of stable thoracolumbar fracture: the PRESTO feasibility RCT. [place] ([place]): [employer]; 2021 Nov. [facility] Technology Assessment, No. 25.62.) Appendix 3, Oswestry Disability Index category descriptors. Available from: Findjewelers.cz  Minimally Clinically Important Difference (MCID) = 12.8%  COGNITION: Overall cognitive status: Within functional limits for tasks assessed     SENSATION: Denies any numbness and tingling currently  MUSCLE LENGTH: Hamstrings: tightness bilat  POSTURE: rounded shoulders and forward head  PALPATION: Tightness and muscle spasms noted bilaterally, worse on right side lumbar paraspinals  LUMBAR ROM:   Eval: Tightness with at least a 25% restriction (more stiffness than pain)  LOWER EXTREMITY ROM:     Eval:  WFL  LOWER EXTREMITY MMT:    Eval: Right hip strength of 4/5 Left hip strength of 4+ to 5/5 Quads are WFL  LUMBAR SPECIAL TESTS:  Slump test: Negative  FUNCTIONAL TESTS:  Eval: 30 seconds chair stand test:  13 complete stands Forward T:  noted weakness with hip instability  GAIT: Distance walked: >500 ft Assistive device utilized: None Level of assistance: Complete Independence Comments: Patient reports that no pain with walking, more with standing  TREATMENT DATE:  06/09/2024 Nu Step seat 10, resistance 7 x 7 min Quad stretch with strap standing  x 3 ea 20 sec. Lateral band walks with blue band above knees 10 steps x 4 lengths. Emphaasis on smaller steps and keeping knees in line with toes Standing march  with core activation x 10, heel raises x 15 with core activation Supine Ppt x 8 held due to right SI pain TrA x 10 no pain TrA with low march x 10 Bolster adductor squeeze with TrA  x 10  Bridge with ball squeeze and TrA  2x 5  Figure 4 stretch supine x 3 B Discussed performing 1 stretch for HS, hip flexor, piriformis, quads that she likes and sticking with that. Being mindful of any exercise that produces right SI pain. Updated HEP   05/12/2024: Issued and reviewed HEP and provided with blue theraband Reviewed role of PT and goals   PATIENT EDUCATION:  Access Code: [facility] URL: [email or web address] Date: 06/09/2024 Prepared by: Grayce Sheldon  Exercises - Supine Transversus Abdominis Bracing - Hands on Stomach  - 1 x daily - 3 x weekly - 1-2 sets - 10 reps - Supine Hip Adduction Isometric with Ball  - 1 x daily - 3 x weekly - 1-2 sets - 10 reps - Hooklying Clamshell with Resistance  - 1 x daily - 3 x weekly - 1-2 sets - 10 reps - Supine March  - 1 x daily - 7 x weekly - 1-2 sets - 10 reps Education details: Issued HEP Person educated: Patient Education method: Explanation, Demonstration, and Handouts Education comprehension: verbalized understanding and returned demonstration  HOME EXERCISE PROGRAM: Access Code: [email or web address] URL: [email or web address] Date: 05/12/2024 Prepared by: Jarrell Menke  Exercises - Seated Piriformis Stretch  - 2 x daily - 4 x weekly - 5 reps - Half Kneeling Hip Flexor Stretch  - 2 x daily - 4 x weekly - 10 sec hold - Half Kneeling Hip Flexor Stretch with Chair  - 2 x daily - 4 x weekly - 10 seconds hold - Standing Hamstring Stretch on Chair  -  1 x daily - 4 x weekly - 2 reps - 20 sec hold - Forward T  - 1 x daily - 4 x weekly - 1-2 sets - 10 reps - Side Stepping with Resistance at Thighs  - 1 x daily - 3 x weekly - 10 reps - Standing Anti-Rotation Press with Anchored Resistance  - 1 x daily - 3 x weekly - 10 reps -  Bridge  - 1 x daily - 3 x weekly - 2 sets - 10 reps - Standing Low Trap Setting with Resistance at Wall  - 1 x daily - 4 x weekly - 1-2 sets - 10 reps - Doorway Pec Stretch at 90 Degrees Abduction  - 1 x daily - 4 x weekly - 2 reps - 20 sec hold  ASSESSMENT:  CLINICAL IMPRESSION: Pt did well with exercises instructed, and had 1 episode of Right SI pain when doing ppt in too large a ROM. When performed as TrA contraction this was improved and she had no other reproductions of pain. HEP was instructed to include gentle stab exercises.   EVAL Patient is a 43 y.o. female who was seen today for physical therapy evaluation and treatment for right sciatica pain.  Patient is known to this PT from a previous treatment where she had similar right sided SI pain.  Patient states that she would like an exercise program that she could continue at home to decrease the risk of this pain happening again.  At time of last evaluation, patient was able to complete 14 stands in 30 seconds, today, she could complete 13.  Patient presents with right sided hip weakness, pec tightness, hamstring tightness, abnormal posture, and difficulty performing functional tasks.  Patient would benefit from skilled PT to progress towards goal related activities.  OBJECTIVE IMPAIRMENTS: decreased strength, increased muscle spasms, impaired flexibility, impaired sensation, postural dysfunction, and pain.   ACTIVITY LIMITATIONS: carrying, lifting, bending, standing, and squatting  PARTICIPATION LIMITATIONS: community activity, occupation, and yard work  PERSONAL FACTORS: Past/current experiences and Time since onset of injury/illness/exacerbation are also affecting patient's functional outcome.   REHAB POTENTIAL: Good  CLINICAL DECISION MAKING: Stable/uncomplicated  EVALUATION COMPLEXITY: Low   GOALS: Goals reviewed with patient? Yes  SHORT TERM GOALS: Target date: 06/06/2024  Pt will be independent with initial HEP.   Baseline: Goal status: INITIAL  2.   Patient will report at least a 25% improvement since starting PT.  Baseline:  Goal status: INITIAL    LONG TERM GOALS: Target date: 07/10/2024  Patient will be independent with advanced HEP to allow for self progression after discharge. Baseline:  Goal status: INITIAL  2.  Patient will be able to work a shift of work without increased pain.  Baseline:  Goal status: INITIAL  3.  Patient will increase LE strength to [facility] to allow her to complete a forward T without hip compensations. Baseline:  Goal status: INITIAL  4.  Patient will improve modified Oswestry to no greater than 20% to demonstrate improvements in functional mobility. Baseline: 32% Goal status: INITIAL  5.  Patient will demonstrate improved body mechanics and posture and report at least a 50% decrease in shoulder pain. Baseline:  Goal status: INITIAL    PLAN:  PT FREQUENCY: 1-2x/week  PT DURATION: 8 weeks  PLANNED INTERVENTIONS: 97164- PT Re-evaluation, 97750- Physical Performance Testing, 97110-Therapeutic exercises, 97530- Therapeutic activity, [id number]- Neuromuscular re-education, 97535- Self Care, [id number]- Manual therapy, [id number]- Gait training, [phone]- Canalith repositioning, [id number]- Aquatic Therapy, [id number]- Electrical stimulation (unattended),  [id number]- Electrical stimulation (manual), 02983- Vasopneumatic device, [id number]- Ultrasound, [id number]- Traction (mechanical), 02966- Ionotophoresis 4mg /ml Dexamethasone , 79439 (1-2 muscles), [id number] (3+ muscles)- Dry Needling, Patient/Family education, Balance training, Stair training, Taping, Joint mobilization, Joint manipulation, Spinal manipulation, Spinal mobilization, Vestibular training, Cryotherapy, and Moist heat.  PLAN FOR NEXT SESSION: Assess and progress HEP as indicated, strengthening, flexibility, manual/dry needling as indicated    Grayce Sheldon, PT 06/09/2024, 5:25 PM  [facility] [address]  [address] [place], [place] [address] Phone # [phone] Fax [phone]  "

## 2024-06-10 LAB — T3: T3, Total: 93 ng/dL (ref 71–180)

## 2024-06-10 LAB — CBC WITH DIFFERENTIAL/PLATELET
Basophils Absolute: 0 x10E3/uL (ref 0.0–0.2)
Basos: 1 %
EOS (ABSOLUTE): 0.1 x10E3/uL (ref 0.0–0.4)
Eos: 1 %
Hematocrit: 42.4 % (ref 34.0–46.6)
Hemoglobin: 14 g/dL (ref 11.1–15.9)
Immature Grans (Abs): 0 x10E3/uL (ref 0.0–0.1)
Immature Granulocytes: 0 %
Lymphocytes Absolute: 1.4 x10E3/uL (ref 0.7–3.1)
Lymphs: 27 %
MCH: 33.5 pg — ABNORMAL HIGH (ref 26.6–33.0)
MCHC: 33 g/dL (ref 31.5–35.7)
MCV: 101 fL — ABNORMAL HIGH (ref 79–97)
Monocytes Absolute: 0.4 x10E3/uL (ref 0.1–0.9)
Monocytes: 7 %
Neutrophils Absolute: 3.3 x10E3/uL (ref 1.4–7.0)
Neutrophils: 64 %
Platelets: 260 x10E3/uL (ref 150–450)
RBC: 4.18 x10E6/uL (ref 3.77–5.28)
RDW: 11.8 % (ref 11.7–15.4)
WBC: 5.2 x10E3/uL (ref 3.4–10.8)

## 2024-06-10 LAB — CMP14+EGFR
ALT: 14 IU/L (ref 0–32)
AST: 14 IU/L (ref 0–40)
Albumin: 3.8 g/dL — ABNORMAL LOW (ref 3.9–4.9)
Alkaline Phosphatase: 66 IU/L (ref 41–116)
BUN/Creatinine Ratio: 18 (ref 9–23)
BUN: 15 mg/dL (ref 6–24)
Bilirubin Total: 0.6 mg/dL (ref 0.0–1.2)
CO2: 20 mmol/L (ref 20–29)
Calcium: 8.5 mg/dL — ABNORMAL LOW (ref 8.7–10.2)
Chloride: 105 mmol/L (ref 96–106)
Creatinine, Ser: 0.82 mg/dL (ref 0.57–1.00)
Globulin, Total: 1.9 g/dL (ref 1.5–4.5)
Glucose: 77 mg/dL (ref 70–99)
Potassium: 4.4 mmol/L (ref 3.5–5.2)
Sodium: 138 mmol/L (ref 134–144)
Total Protein: 5.7 g/dL — ABNORMAL LOW (ref 6.0–8.5)
eGFR: 90 mL/min/1.73

## 2024-06-10 LAB — HEMOGLOBIN A1C
Est. average glucose Bld gHb Est-mCnc: 91 mg/dL
Hgb A1c MFr Bld: 4.8 % (ref 4.8–5.6)

## 2024-06-10 LAB — LIPID PANEL WITH LDL/HDL RATIO
Cholesterol, Total: 129 mg/dL (ref 100–199)
HDL: 60 mg/dL
LDL Chol Calc (NIH): 56 mg/dL (ref 0–99)
LDL/HDL Ratio: 0.9 ratio (ref 0.0–3.2)
Triglycerides: 61 mg/dL (ref 0–149)
VLDL Cholesterol Cal: 13 mg/dL (ref 5–40)

## 2024-06-10 LAB — TSH: TSH: 1.95 u[IU]/mL (ref 0.450–4.500)

## 2024-06-10 LAB — T4, FREE: Free T4: 1.67 ng/dL (ref 0.82–1.77)

## 2024-06-10 LAB — INSULIN, RANDOM: INSULIN: 6.2 u[IU]/mL (ref 2.6–24.9)

## 2024-06-10 LAB — VITAMIN D 25 HYDROXY (VIT D DEFICIENCY, FRACTURES): Vit D, 25-Hydroxy: 71.8 ng/mL (ref 30.0–100.0)

## 2024-06-10 LAB — VITAMIN B12: Vitamin B-12: 1218 pg/mL (ref 232–1245)

## 2024-06-17 ENCOUNTER — Ambulatory Visit (INDEPENDENT_AMBULATORY_CARE_PROVIDER_SITE_OTHER): Payer: Self-pay | Admitting: Family Medicine

## 2024-06-17 ENCOUNTER — Encounter (INDEPENDENT_AMBULATORY_CARE_PROVIDER_SITE_OTHER): Payer: Self-pay | Admitting: Family Medicine

## 2024-06-17 VITALS — BP 109/75 | HR 69 | Temp 98.0°F | Ht 67.0 in | Wt 235.0 lb

## 2024-06-17 DIAGNOSIS — E669 Obesity, unspecified: Secondary | ICD-10-CM | POA: Diagnosis not present

## 2024-06-17 DIAGNOSIS — E88819 Insulin resistance, unspecified: Secondary | ICD-10-CM

## 2024-06-17 DIAGNOSIS — R632 Polyphagia: Secondary | ICD-10-CM

## 2024-06-17 DIAGNOSIS — Z6836 Body mass index (BMI) 36.0-36.9, adult: Secondary | ICD-10-CM

## 2024-06-17 MED ORDER — TIRZEPATIDE-WEIGHT MANAGEMENT 15 MG/0.5ML ~~LOC~~ SOLN: 15.0000 mg | SUBCUTANEOUS | 1 refills | Status: AC

## 2024-06-17 NOTE — Progress Notes (Signed)
 "  Office: [phone]  /  Fax: [phone]  WEIGHT SUMMARY AND BIOMETRICS  Anthropometric Measurements Height: 5' 7 (1.702 m) Weight: 235 lb (106.6 kg) BMI (Calculated): 36.8 Weight at Last Visit: 233lb Weight Lost Since Last Visit: 2lb Weight Gained Since Last Visit: 0lb Starting Weight: 300lb Total Weight Loss (lbs): 69 lb (31.3 kg) Peak Weight: 320lb   Body Composition  Body Fat %: 46.2 % Fat Mass (lbs): 108.8 lbs Muscle Mass (lbs): 120.2 lbs Total Body Water (lbs): 84.8 lbs Visceral Fat Rating : 12   Other Clinical Data Fasting: No Labs: No Today's Visit #: 33 Starting Date: 01/09/22 Comments: 1500+85    Chief Complaint: OBESITY    History of Present Illness Penny Wanda Ned, 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  She adheres to a dietary plan of 1500 calories and 85 or more grams of protein daily, with about 90% compliance. Her diet includes increased fruits and vegetables, and she maintains hydration. However, she struggles to consistently meet the recommended protein intake.  Her physical activity includes walking for exercise two days a week for 20 minutes each session. She has started physical therapy for right-sided pain and reports tightness in her hip girdle muscles. Over the last five weeks, she has lost two pounds, including during the Thanksgiving and Christmas holidays.  For insulin  resistance, she is treated with diet, exercise, weight loss, metformin , and Zepbound . She is on Zepbound  for polyphagia and tolerates it well. She effectively splits the dose and reports a recent cost of $500 for a two-month supply.  She describes a busy work schedule and family commitments impacting her meal planning. She anticipates a less hectic schedule in January to re-establish routines. She is exploring indoor exercise options due to limited space and safety concerns for  outdoor activities after dark.      PHYSICAL EXAM:  Blood pressure 109/75, pulse 69, temperature 98 F (36.7 C), height 5' 7 (1.702 m), weight 235 lb (106.6 kg), SpO2 100%. Body mass index is 36.81 kg/m.  DIAGNOSTIC DATA REVIEWED:  BMET    Component Value Date/Time   NA 138 06/09/2024 0811   K 4.4 06/09/2024 0811   CL 105 06/09/2024 0811   CO2 20 06/09/2024 0811   GLUCOSE 77 06/09/2024 0811   GLUCOSE 108 (H) 07/10/2021 0912   BUN 15 06/09/2024 0811   CREATININE 0.82 06/09/2024 0811   CREATININE 0.85 07/10/2021 0912   CALCIUM  8.5 (L) 06/09/2024 0811   GFRNONAA 86 04/30/2019 0904   GFRAA 99 04/30/2019 0904   Lab Results  Component Value Date   HGBA1C 4.8 06/09/2024   HGBA1C 5.1 04/30/2019   Lab Results  Component Value Date   INSULIN  6.2 06/09/2024   INSULIN  8.0 01/09/2022   Lab Results  Component Value Date   TSH 1.950 06/09/2024   CBC    Component Value Date/Time   WBC 5.2 06/09/2024 0811   WBC 5.8 07/10/2021 0912   RBC 4.18 06/09/2024 0811   RBC 4.59 07/10/2021 0912   HGB 14.0 06/09/2024 0811   HCT 42.4 06/09/2024 0811   PLT 260 06/09/2024 0811   MCV 101 (H) 06/09/2024 0811   MCH 33.5 (H) 06/09/2024 0811   MCH 32.5 07/10/2021 0912   MCHC 33.0 06/09/2024 0811   MCHC 33.9 07/10/2021 0912   RDW 11.8 06/09/2024 0811   Iron Studies No results found for: IRON, TIBC, FERRITIN, IRONPCTSAT Lipid Panel  Component Value Date/Time   CHOL 129 06/09/2024 0811   TRIG 61 06/09/2024 0811   HDL 60 06/09/2024 0811   CHOLHDL 2.6 12/26/2021 1158   LDLCALC 56 06/09/2024 0811   LDLCALC 72 12/26/2021 1158   Hepatic Function Panel     Component Value Date/Time   PROT 5.7 (L) 06/09/2024 0811   ALBUMIN 3.8 (L) 06/09/2024 0811   AST 14 06/09/2024 0811   ALT 14 06/09/2024 0811   ALKPHOS 66 06/09/2024 0811   BILITOT 0.6 06/09/2024 0811   BILIDIR 0.1 08/17/2019 0902   IBILI 0.5 08/17/2019 0902      Component Value Date/Time   TSH 1.950 06/09/2024  0811   Nutritional Lab Results  Component Value Date   VD25OH 71.8 06/09/2024   VD25OH 57.8 10/15/2023   VD25OH 67.2 08/06/2023     Assessment and Plan Assessment & Plan Obesity Management is ongoing with a focus on dietary modifications and exercise. She adheres to a 1500 calorie diet with 85 grams of protein, though struggles to meet protein goals. Engages in walking twice a week for 20 minutes and has started physical therapy for right-sided sciatica pain. Despite a busy schedule, she has lost two pounds over the last five weeks, including Thanksgiving and Christmas, indicating progress. - Continue 1500 calorie diet with 85 grams of protein. - Encouraged increased protein intake, especially in the morning. - Continue walking for exercise twice a week. - Continue physical therapy for right-sided sciatica pain. - Consider indoor exercise options such as [place] walking videos and chair yoga. - Encouraged breaking exercise into smaller, more frequent sessions for increased benefit.  Insulin  resistance Managed with diet, exercise, weight loss, metformin , and Zepbound . She tolerates Zepbound  well and is splitting the dose effectively. Discussed potential future use of Wegovy (semaglutide) as a more affordable GLP-1 option, available from January 2nd, with a self-pay price between $150 and $200 per month. - Continue metformin  and Zepbound  for insulin  resistance. - Discussed potential future use of Wegovy (semaglutide) as a more affordable GLP-1 option. - Continue diet, exercise and weight loss as discussed today as an important part of the treatment plan   Polyphagia Managed with Zepbound , which she tolerates well. No issues reported with the current regimen. - Continue Zepbound  for polyphagia management, 7.5 mg weekly      Patients who are on anti-obesity medications are counseled on the importance of maintaining healthy lifestyle habits, including balanced nutrition, regular  physical activity, and behavioral modifications,  Medication is an adjunct to, not a replacement for, lifestyle changes and that the long-term success and weight maintenance depend on continued adherence to these strategies.   Penny Hancock was informed of the importance of frequent follow up visits to maximize her success with intensive lifestyle modifications for her obesity and obesity related health conditions as recommended by USPSTF and CMS guidelines   Louann Penton, MD   "

## 2024-06-23 ENCOUNTER — Ambulatory Visit: Attending: Internal Medicine | Admitting: Rehabilitative and Restorative Service Providers"

## 2024-06-23 ENCOUNTER — Encounter: Payer: Self-pay | Admitting: Rehabilitative and Restorative Service Providers"

## 2024-06-23 DIAGNOSIS — M6281 Muscle weakness (generalized): Secondary | ICD-10-CM | POA: Insufficient documentation

## 2024-06-23 DIAGNOSIS — M533 Sacrococcygeal disorders, not elsewhere classified: Secondary | ICD-10-CM | POA: Diagnosis present

## 2024-06-23 DIAGNOSIS — R252 Cramp and spasm: Secondary | ICD-10-CM | POA: Insufficient documentation

## 2024-06-23 DIAGNOSIS — R293 Abnormal posture: Secondary | ICD-10-CM | POA: Diagnosis present

## 2024-06-23 NOTE — Therapy (Signed)
 " OUTPATIENT PHYSICAL THERAPY THORACOLUMBAR TREATMENT   Patient Name: Penny Santizo, MD MRN: [id number] DOB:09/09/1980, 44 y.o., female Today's Date: 06/23/2024  END OF SESSION:  PT End of Session - 06/23/24 1446     Visit Number 3    Date for Recertification  07/10/24    Authorization Type BC/BS    PT Start Time 1446    PT Stop Time 1525    PT Time Calculation (min) 39 min    Activity Tolerance Patient tolerated treatment well    Behavior During Therapy WFL for tasks assessed/performed          Past Medical History:  Diagnosis Date   Back pain    Clotting disorder    GERD (gastroesophageal reflux disease)    resolved    H/O blood clots    High cholesterol    Hypertension    Hypothyroidism    Joint pain    PONV (postoperative nausea and vomiting)    mild nausea responsive to meds    Pulmonary embolism (HCC) 10/2017   Thyroid  carcinoma (HCC)    Vitamin D  deficiency    Past Surgical History:  Procedure Laterality Date   broken wrist     KNEE SURGERY     arthroscopy    THYROIDECTOMY Right 07/24/2018   Procedure: RIGHT THYROID  LOBECTOMY  AND ISTHMUS;  Surgeon: Eletha Boas, MD;  Location: [facility];  Service: General;  Laterality: Right;   Patient Active Problem List   Diagnosis Date Noted   Polyphagia 10/15/2023   B12 deficiency 10/15/2023   Hypocalcemia 10/15/2023   BMI 39.0-39.9,adult 05/07/2023   Prediabetes 01/22/2023   Insulin  resistance 07/17/2022   BMI 40.0-44.9, adult (HCC) 07/17/2022   Obesity, Beginning BMI 46.99 07/17/2022   Sacroiliitis 06/19/2022   Stress 04/24/2022   Hypothyroidism 04/02/2022   Depression 03/20/2022   Vitamin D  deficiency 03/06/2022   History of pulmonary embolism 11/28/2021   Pure hypercholesterolemia 04/27/2021   Essential hypertension 04/27/2021   Shingles of eyelid 02/22/2020   Postsurgical hypothyroidism 10/28/2018   Papillary thyroid  carcinoma (HCC) 10/28/2018    PCP: Perri Ronal PARAS, MD  REFERRING PROVIDER:  Perri Ronal PARAS, MD  REFERRING DIAG: M54.31 (ICD-10-CM) - Sciatica of right side  Rationale for Evaluation and Treatment: Rehabilitation  THERAPY DIAG:  Muscle weakness (generalized)  Cramp and spasm  SI (sacroiliac) pain  Abnormal posture  ONSET DATE: early October 2025  SUBJECTIVE:  SUBJECTIVE STATEMENT: Patient reports spasms around glutes and abductors during aerobic activity.  EVAL Patient went to [place], [place] at the beginning of October and she started having the SI pain on her right side like she did in early 2024.  Patient then went to [facility] at the beginning of November, and with the increased walking around, her pain seemed to decrease.  Patient is hoping for a HEP that she can do at home to prevent the pain from happening again.  Patient additionally reports that she has been having some tightness in her pectorals and sometimes has some soreness.  PERTINENT HISTORY:  bilat pulmonary embolism on 11/18/2017, thyroid  cancer s/p right thyroidectomy 07/24/2018, elevated BMI, HTN   PAIN:  Are you having pain? Yes: NPRS scale: 0-2/10 Pain location: right SI region Pain description: sharp/radiating initially, otherwise dull Aggravating factors: unknown Relieving factors: unknown  PRECAUTIONS: None  RED FLAGS: None   WEIGHT BEARING RESTRICTIONS: No  FALLS:  Has patient fallen in last 6 months? No  LIVING ENVIRONMENT: Lives with: lives alone Lives in: House/apartment Stairs: two level home Has following equipment at home: Single point cane and Crutches  OCCUPATION: Careers Adviser, does robotic surgery   PLOF: Independent, Vocation/Vocational requirements: sitting with robotic surgery, and Leisure: gardening  PATIENT GOALS: To have a long term prevention plan for the pain.  NEXT MD VISIT:  as needed  OBJECTIVE:  Note: Objective measures were completed at Evaluation unless otherwise noted.  DIAGNOSTIC FINDINGS:  N/A  PATIENT SURVEYS:  Modified Oswestry:  MODIFIED OSWESTRY DISABILITY SCALE  Score Date: 05/12/2024  Pain intensity 3 =  Pain medication provides me with moderate relief from pain.  2. Personal care (washing, dressing, etc.) 2 =  It is painful to take care of myself, and I am slow and careful.  3. Lifting 2 = Pain prevents me from lifting heavy weights off the floor,but I can manage if the weights are conveniently positioned (e.g. on a table)  4. Walking 0 = Pain does not prevent me from walking any distance  5. Sitting 2 =  Pain prevents me from sitting more than 1 hour.  6. Standing 1 =  I can stand as long as I want but, it increases my pain.  7. Sleeping 2 =  Even when I take pain medication, I sleep less than 6 hours  8. Social Life 2 = Pain prevents me from participating in more energetic activities (eg. sports, dancing).  9. Traveling 1 =  I can travel anywhere, but it increases my pain.  10. Employment/ Homemaking 1 = My normal homemaking/job activities increase my pain, but I can still perform all that is required of me  Total 16/50 = 32%   Interpretation of scores: Score Category Description  0-20% Minimal Disability The patient can cope with most living activities. Usually no treatment is indicated apart from advice on lifting, sitting and exercise  21-40% Moderate Disability The patient experiences more pain and difficulty with sitting, lifting and standing. Travel and social life are more difficult and they may be disabled from work. Personal care, sexual activity and sleeping are not grossly affected, and the patient can usually be managed by conservative means  41-60% Severe Disability Pain remains the main problem in this group, but activities of daily living are affected. These patients require a detailed investigation  61-80% Crippled Back pain  impinges on all aspects of the patients life. Positive intervention is required  81-100% Bed-bound These patients are either bed-bound or exaggerating  their symptoms  Bluford FORBES [name] [name], et al. Surgery versus conservative management of stable thoracolumbar fracture: the PRESTO feasibility RCT. [place] ([place]): [employer]; 2021 Nov. [facility] Technology Assessment, No. 25.62.) Appendix 3, Oswestry Disability Index category descriptors. Available from: Findjewelers.cz  Minimally Clinically Important Difference (MCID) = 12.8%  COGNITION: Overall cognitive status: Within functional limits for tasks assessed     SENSATION: Denies any numbness and tingling currently  MUSCLE LENGTH: Hamstrings: tightness bilat  POSTURE: rounded shoulders and forward head  PALPATION: Tightness and muscle spasms noted bilaterally, worse on right side lumbar paraspinals  LUMBAR ROM:   Eval: Tightness with at least a 25% restriction (more stiffness than pain)  LOWER EXTREMITY ROM:     Eval:  WFL  LOWER EXTREMITY MMT:    Eval: Right hip strength of 4/5 Left hip strength of 4+ to 5/5 Quads are WFL  LUMBAR SPECIAL TESTS:  Slump test: Negative  FUNCTIONAL TESTS:  Eval: 30 seconds chair stand test:  13 complete stands Forward T:  noted weakness with hip instability  GAIT: Distance walked: >500 ft Assistive device utilized: None Level of assistance: Complete Independence Comments: Patient reports that no pain with walking, more with standing  TREATMENT DATE:   06/23/2024 Nu Step seat 10, resistance 7 x 7 min Standing hamstring stretch 2x30 sec bilat Hip flexor stretch with lateral overhead reach 2x30 sec bilat Standing march with alternating punches with core activation 3# weight x 10 Mini lunge on bosu ball with core activation x 10 bilat Side steps with band (unable to perform due to pain in knees; lack of muscle control in  abductors) Standing hip abduction on blue balance pad 2x10 Standing hip extension on blue balance pad x10 Supine hip IR and ER 2x10 MET supine hamstring contraction for SIJ x5 Supine marches x10 Bent knee fall outs x10 Hooklying glute bridges x10 Glute cross body stretch 2x20 sec Prone hamstring curl x10  06/09/2024 Nu Step seat 10, resistance 7 x 7 min Quad stretch with strap standing  x 3 ea 20 sec. Lateral band walks with blue band above knees 10 steps x 4 lengths. Emphaasis on smaller steps and keeping knees in line with toes Standing march with core activation x 10, heel raises x 15 with core activation Supine Ppt x 8 held due to right SI pain TrA x 10 no pain TrA with low march x 10 Bolster adductor squeeze with TrA  x 10  Bridge with ball squeeze and TrA  2x 5  Figure 4 stretch supine x 3 B Discussed performing 1 stretch for HS, hip flexor, piriformis, quads that she likes and sticking with that. Being mindful of any exercise that produces right SI pain. Updated HEP   05/12/2024: Issued and reviewed HEP and provided with blue theraband Reviewed role of PT and goals   PATIENT EDUCATION:  Access Code: [facility] URL: [email or web address] Date: 06/09/2024 Prepared by: Grayce Sheldon  Exercises - Supine Transversus Abdominis Bracing - Hands on Stomach  - 1 x daily - 3 x weekly - 1-2 sets - 10 reps - Supine Hip Adduction Isometric with Ball  - 1 x daily - 3 x weekly - 1-2 sets - 10 reps - Hooklying Clamshell with Resistance  - 1 x daily - 3 x weekly - 1-2 sets - 10 reps - Supine March  - 1 x daily - 7 x weekly - 1-2 sets - 10 reps Education details: Issued HEP Person educated: Patient Education method: Programmer, Multimedia, Demonstration,  and Handouts Education comprehension: verbalized understanding and returned demonstration  HOME EXERCISE PROGRAM: Access Code: [email or web address] URL: [email or web address] Date: 05/12/2024 Prepared by: Jarrell  Mionna Advincula  Exercises - Seated Piriformis Stretch  - 2 x daily - 4 x weekly - 5 reps - Half Kneeling Hip Flexor Stretch  - 2 x daily - 4 x weekly - 10 sec hold - Half Kneeling Hip Flexor Stretch with Chair  - 2 x daily - 4 x weekly - 10 seconds hold - Standing Hamstring Stretch on Chair  - 1 x daily - 4 x weekly - 2 reps - 20 sec hold - Forward T  - 1 x daily - 4 x weekly - 1-2 sets - 10 reps - Side Stepping with Resistance at Thighs  - 1 x daily - 3 x weekly - 10 reps - Standing Anti-Rotation Press with Anchored Resistance  - 1 x daily - 3 x weekly - 10 reps - Bridge  - 1 x daily - 3 x weekly - 2 sets - 10 reps - Standing Low Trap Setting with Resistance at Wall  - 1 x daily - 4 x weekly - 1-2 sets - 10 reps - Doorway Pec Stretch at 90 Degrees Abduction  - 1 x daily - 4 x weekly - 2 reps - 20 sec hold  ASSESSMENT:  CLINICAL IMPRESSION:  Patient reported very minimal to no pain with main complaint of muscle spasms when trying to perform dynamic aerobic activities such as walking. Patient reports tightness on the right side during exercises. Patient demonstrated decreased muscle control in abductors with side steps and required modification to standing abduction in which patient showed more control, however she compensated with hiking the hip. Patient showed improvement in mobility following MET activation. Patient was instructed to use MET when muscle spasms occur to inhibit muscle from spasm. Patient will benefit from continued skill PT to improve mobility and strength to decrease pain and improve function through dynamic activities.    EVAL Patient is a 44 y.o. female who was seen today for physical therapy evaluation and treatment for right sciatica pain.  Patient is known to this PT from a previous treatment where she had similar right sided SI pain.  Patient states that she would like an exercise program that she could continue at home to decrease the risk of this pain happening again.  At  time of last evaluation, patient was able to complete 14 stands in 30 seconds, today, she could complete 13.  Patient presents with right sided hip weakness, pec tightness, hamstring tightness, abnormal posture, and difficulty performing functional tasks.  Patient would benefit from skilled PT to progress towards goal related activities.  OBJECTIVE IMPAIRMENTS: decreased strength, increased muscle spasms, impaired flexibility, impaired sensation, postural dysfunction, and pain.   ACTIVITY LIMITATIONS: carrying, lifting, bending, standing, and squatting  PARTICIPATION LIMITATIONS: community activity, occupation, and yard work  PERSONAL FACTORS: Past/current experiences and Time since onset of injury/illness/exacerbation are also affecting patient's functional outcome.   REHAB POTENTIAL: Good  CLINICAL DECISION MAKING: Stable/uncomplicated  EVALUATION COMPLEXITY: Low   GOALS: Goals reviewed with patient? Yes  SHORT TERM GOALS: Target date: 06/06/2024  Pt will be independent with initial HEP.  Baseline: Goal status: Met on 06/23/24  2.   Patient will report at least a 25% improvement since starting PT.  Baseline:  Goal status: Ongoing    LONG TERM GOALS: Target date: 07/10/2024  Patient will be independent with advanced HEP to allow for self progression after discharge.  Baseline:  Goal status: INITIAL  2.  Patient will be able to work a shift of work without increased pain.  Baseline:  Goal status: INITIAL  3.  Patient will increase LE strength to [facility] to allow her to complete a forward T without hip compensations. Baseline:  Goal status: INITIAL  4.  Patient will improve modified Oswestry to no greater than 20% to demonstrate improvements in functional mobility. Baseline: 32% Goal status: INITIAL  5.  Patient will demonstrate improved body mechanics and posture and report at least a 50% decrease in shoulder pain. Baseline:  Goal status: INITIAL    PLAN:  PT  FREQUENCY: 1-2x/week  PT DURATION: 8 weeks  PLANNED INTERVENTIONS: 97164- PT Re-evaluation, 97750- Physical Performance Testing, 97110-Therapeutic exercises, 97530- Therapeutic activity, [id number]- Neuromuscular re-education, 97535- Self Care, [id number]- Manual therapy, [id number]- Gait training, [phone]- Canalith repositioning, [id number]- Aquatic Therapy, [phone]- Electrical stimulation (unattended), [phone]- Electrical stimulation (manual), [id number]- Vasopneumatic device, [id number]- Ultrasound, [id number]- Traction (mechanical), [id number]- Ionotophoresis 4mg /ml Dexamethasone , [id number] (1-2 muscles), [id number] (3+ muscles)- Dry Needling, Patient/Family education, Balance training, Stair training, Taping, Joint mobilization, Joint manipulation, Spinal manipulation, Spinal mobilization, Vestibular training, Cryotherapy, and Moist heat.  PLAN FOR NEXT SESSION: Assess and progress HEP as indicated, strengthening, flexibility, manual/dry needling as indicated    Jarrell Laming, PT, DPT 06/23/2024, 3:34 PM  [facility] [address] [place], [place] [address] Phone # [phone] Fax [phone]  "

## 2024-06-30 ENCOUNTER — Ambulatory Visit: Admitting: Rehabilitative and Restorative Service Providers"

## 2024-07-07 ENCOUNTER — Ambulatory Visit (HOSPITAL_BASED_OUTPATIENT_CLINIC_OR_DEPARTMENT_OTHER): Admitting: Obstetrics & Gynecology

## 2024-07-07 ENCOUNTER — Encounter: Payer: Self-pay | Admitting: Rehabilitative and Restorative Service Providers"

## 2024-07-07 ENCOUNTER — Ambulatory Visit: Admitting: Rehabilitative and Restorative Service Providers"

## 2024-07-07 DIAGNOSIS — M6281 Muscle weakness (generalized): Secondary | ICD-10-CM | POA: Diagnosis not present

## 2024-07-07 DIAGNOSIS — R293 Abnormal posture: Secondary | ICD-10-CM

## 2024-07-07 DIAGNOSIS — M533 Sacrococcygeal disorders, not elsewhere classified: Secondary | ICD-10-CM

## 2024-07-07 DIAGNOSIS — R252 Cramp and spasm: Secondary | ICD-10-CM

## 2024-07-07 NOTE — Therapy (Signed)
 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 DOB:04-02-1981, 44 y.o., female Today's Date: 07/07/2024  END OF SESSION:  PT End of Session - 07/07/24 1438     Visit Number 4    Date for Recertification  07/10/24    Authorization Type BC/BS    PT Start Time 1445    PT Stop Time 1525    PT Time Calculation (min) 40 min    Activity Tolerance Patient tolerated treatment well    Behavior During Therapy WFL for tasks assessed/performed          Past Medical History:  Diagnosis Date   Back pain    Clotting disorder    GERD (gastroesophageal reflux disease)    resolved    H/O blood clots    High cholesterol    Hypertension    Hypothyroidism    Joint pain    PONV (postoperative nausea and vomiting)    mild nausea responsive to meds    Pulmonary embolism (HCC) 10/2017   Thyroid  carcinoma (HCC)    Vitamin D  deficiency    Past Surgical History:  Procedure Laterality Date   broken wrist     KNEE SURGERY     arthroscopy    THYROIDECTOMY Right 07/24/2018   Procedure: RIGHT THYROID  LOBECTOMY  AND ISTHMUS;  Surgeon: Eletha Boas, MD;  Location: [facility];  Service: General;  Laterality: Right;   Patient Active Problem List   Diagnosis Date Noted   Polyphagia 10/15/2023   B12 deficiency 10/15/2023   Hypocalcemia 10/15/2023   BMI 39.0-39.9,adult 05/07/2023   Prediabetes 01/22/2023   Insulin  resistance 07/17/2022   BMI 40.0-44.9, adult (HCC) 07/17/2022   Obesity, Beginning BMI 46.99 07/17/2022   Sacroiliitis 06/19/2022   Stress 04/24/2022   Hypothyroidism 04/02/2022   Depression 03/20/2022   Vitamin D  deficiency 03/06/2022   History of pulmonary embolism 11/28/2021   Pure hypercholesterolemia 04/27/2021   Essential hypertension 04/27/2021   Shingles of eyelid 02/22/2020   Postsurgical hypothyroidism 10/28/2018   Papillary thyroid  carcinoma (HCC) 10/28/2018    PCP: Perri Ronal PARAS,  MD  REFERRING PROVIDER: Perri Ronal PARAS, MD  REFERRING DIAG: M54.31 (ICD-10-CM) - Sciatica of right side  Rationale for Evaluation and Treatment: Rehabilitation  THERAPY DIAG:  Muscle weakness (generalized)  Cramp and spasm  SI (sacroiliac) pain  Abnormal posture  ONSET DATE: early October 2025  SUBJECTIVE:  SUBJECTIVE STATEMENT: Patient reports feeling at least 30% better since starting PT.  She states that the new muscle energy technique has been helping.  If I do get pain, I can do that exercise and it goes away.  EVAL Patient went to [place], [place] at the beginning of October and she started having the SI pain on her right side like she did in early 2024.  Patient then went to [facility] at the beginning of November, and with the increased walking around, her pain seemed to decrease.  Patient is hoping for a HEP that she can do at home to prevent the pain from happening again.  Patient additionally reports that she has been having some tightness in her pectorals and sometimes has some soreness.  PERTINENT HISTORY:  bilat pulmonary embolism on 11/18/2017, thyroid  cancer s/p right thyroidectomy 07/24/2018, elevated BMI, HTN   PAIN:  Are you having pain? No  PRECAUTIONS: None  RED FLAGS: None   WEIGHT BEARING RESTRICTIONS: No  FALLS:  Has patient fallen in last 6 months? No  LIVING ENVIRONMENT: Lives with: lives alone Lives in: House/apartment Stairs: two level home Has following equipment at home: Single point cane and Crutches  OCCUPATION: Careers Adviser, does robotic surgery   PLOF: Independent, Vocation/Vocational requirements: sitting with robotic surgery, and Leisure: gardening  PATIENT GOALS: To have a long term prevention plan for the pain.  NEXT MD VISIT: as needed  OBJECTIVE:   Note: Objective measures were completed at Evaluation unless otherwise noted.  DIAGNOSTIC FINDINGS:  N/A  PATIENT SURVEYS:  Modified Oswestry:  MODIFIED OSWESTRY DISABILITY SCALE   Score Date: 05/12/2024 07/07/24  Pain intensity 3 =  Pain medication provides me with moderate relief from pain. 0  2. Personal care (washing, dressing, etc.) 2 =  It is painful to take care of myself, and I am slow and careful. 0  3. Lifting 2 = Pain prevents me from lifting heavy weights off the floor,but I can manage if the weights are conveniently positioned (e.g. on a table) 1  4. Walking 0 = Pain does not prevent me from walking any distance 1  5. Sitting 2 =  Pain prevents me from sitting more than 1 hour. 2  6. Standing 1 =  I can stand as long as I want but, it increases my pain. 0  7. Sleeping 2 =  Even when I take pain medication, I sleep less than 6 hours 0  8. Social Life 2 = Pain prevents me from participating in more energetic activities (eg. sports, dancing). 0  9. Traveling 1 =  I can travel anywhere, but it increases my pain. 0  10. Employment/ Homemaking 1 = My normal homemaking/job activities increase my pain, but I can still perform all that is required of me 0  Total 16/50 = 32% 4/50 = 8%   Interpretation of scores: Score Category Description  0-20% Minimal Disability The patient can cope with most living activities. Usually no treatment is indicated apart from advice on lifting, sitting and exercise  21-40% Moderate Disability The patient experiences more pain and difficulty with sitting, lifting and standing. Travel and social life are more difficult and they may be disabled from work. Personal care, sexual activity and sleeping are not grossly affected, and the patient can usually be managed by conservative means  41-60% Severe Disability Pain remains the main problem in this group, but activities of daily living are affected. These patients require a detailed investigation  61-80%  Crippled Back pain  impinges on all aspects of the patients life. Positive intervention is required  81-100% Bed-bound These patients are either bed-bound or exaggerating their symptoms  Bluford FORBES [name] [name], et al. Surgery versus conservative management of stable thoracolumbar fracture: the PRESTO feasibility RCT. [place] ([place]): [employer]; 2021 Nov. [facility] Technology Assessment, No. 25.62.) Appendix 3, Oswestry Disability Index category descriptors. Available from: Findjewelers.cz  Minimally Clinically Important Difference (MCID) = 12.8%  COGNITION: Overall cognitive status: Within functional limits for tasks assessed     SENSATION: Denies any numbness and tingling currently  MUSCLE LENGTH: Hamstrings: tightness bilat  POSTURE: rounded shoulders and forward head  PALPATION: Tightness and muscle spasms noted bilaterally, worse on right side lumbar paraspinals  LUMBAR ROM:   Eval: Tightness with at least a 25% restriction (more stiffness than pain)  LOWER EXTREMITY ROM:     Eval:  WFL  LOWER EXTREMITY MMT:    Eval: Right hip strength of 4/5 Left hip strength of 4+ to 5/5 Quads are [facility]  LUMBAR SPECIAL TESTS:  Slump test: Negative  FUNCTIONAL TESTS:  Eval: 30 seconds chair stand test:  13 complete stands Forward T:  noted weakness with hip instability  07/07/2024: Forward T:  able to maintain hip stability  GAIT: Distance walked: >500 ft Assistive device utilized: None Level of assistance: Complete Independence Comments: Patient reports that no pain with walking, more with standing  TREATMENT DATE:   07/07/2024: Nustep level 6 x 6 min with PT present to discuss status Modified Oswestry:  4/50 = 8.0% Standing hamstring stretch at stairs 2x20 sec bilat Hip flexor stretch with lateral overhead reach 2x30 sec bilat Standing march with alternating punches with core activation 3# weight x 10 Mini lunge on bosu  ball with core activation x 10 bilat Hip Matrix 40#:  hip extension and hip abduction.  2x10 each bilat Leg Press (seat at 6):  95# 2x10 Single Leg Press:  55# 2x10 bilat Forward T 2x10 bilat   06/23/2024 Nu Step seat 10, resistance 7 x 7 min Standing hamstring stretch 2x30 sec bilat Hip flexor stretch with lateral overhead reach 2x30 sec bilat Standing march with alternating punches with core activation 3# weight x 10 Mini lunge on bosu ball with core activation x 10 bilat Side steps with band (unable to perform due to pain in knees; lack of muscle control in abductors) Standing hip abduction on blue balance pad 2x10 Standing hip extension on blue balance pad x10 Supine hip IR and ER 2x10 MET supine hamstring contraction for SIJ x5 Supine marches x10 Bent knee fall outs x10 Hooklying glute bridges x10 Glute cross body stretch 2x20 sec Prone hamstring curl x10  06/09/2024 Nu Step seat 10, resistance 7 x 7 min Quad stretch with strap standing  x 3 ea 20 sec. Lateral band walks with blue band above knees 10 steps x 4 lengths. Emphaasis on smaller steps and keeping knees in line with toes Standing march with core activation x 10, heel raises x 15 with core activation Supine Ppt x 8 held due to right SI pain TrA x 10 no pain TrA with low march x 10 Bolster adductor squeeze with TrA  x 10  Bridge with ball squeeze and TrA  2x 5  Figure 4 stretch supine x 3 B Discussed performing 1 stretch for HS, hip flexor, piriformis, quads that she likes and sticking with that. Being mindful of any exercise that produces right SI pain. Updated HEP    PATIENT EDUCATION:  Education details: Issued HEP Person educated: Patient Education method: Explanation, Demonstration, and Handouts Education comprehension: verbalized understanding and returned demonstration  HOME EXERCISE PROGRAM: Access Code: [email or web address] URL: [email or web address] Date: 07/07/2024 Prepared by: Jarrell  Bailie Christenbury  Exercises - Seated Piriformis Stretch  - 2 x daily - 4 x weekly - 5 reps - Half Kneeling Hip Flexor Stretch  - 2 x daily - 4 x weekly - 10 sec hold - Half Kneeling Hip Flexor Stretch with Chair  - 2 x daily - 4 x weekly - 10 seconds hold - Standing Hamstring Stretch on Chair  - 1 x daily - 4 x weekly - 2 reps - 20 sec hold - Forward T  - 1 x daily - 4 x weekly - 1-2 sets - 10 reps - Side Stepping with Resistance at Thighs  - 1 x daily - 3 x weekly - 10 reps - Standing Anti-Rotation Press with Anchored Resistance  - 1 x daily - 3 x weekly - 10 reps - Bridge  - 1 x daily - 3 x weekly - 2 sets - 10 reps - Standing Low Trap Setting with Resistance at Wall  - 1 x daily - 4 x weekly - 1-2 sets - 10 reps - Doorway Pec Stretch at 90 Degrees Abduction  - 1 x daily - 4 x weekly - 2 reps - 20 sec hold - Supine SI Joint Self-Correction  - 1 x daily - 7 x weekly - 2 reps - Standing March with Alternating 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  Patient also reports that the SI joint muscle energy technique has been working well to stop her SI joint pain whenever she does have it.  Patient states that since she started stretching her shoulders, she does not have that pain any more.  Patient reports that she has been able to work a shift at work without increased pain.  Additionally, patient able to perform forward T with improved muscle stability.  Patient demonstrates improved body mechanics and no longer reports having the shoulder pain that she did at evaluation.  Patient has met all PT goals at this time and is ready for discharge from skilled PT to continue with HEP.  OBJECTIVE IMPAIRMENTS: decreased strength, increased muscle spasms, impaired flexibility, impaired  sensation, postural dysfunction, and pain.   ACTIVITY LIMITATIONS: carrying, lifting, bending, standing, and squatting  PARTICIPATION LIMITATIONS: community activity, occupation, and yard work  PERSONAL FACTORS: Past/current experiences and Time since onset of injury/illness/exacerbation are also affecting patient's functional outcome.   REHAB POTENTIAL: Good  CLINICAL DECISION MAKING: Stable/uncomplicated  EVALUATION COMPLEXITY: Low   GOALS: Goals reviewed with patient? Yes  SHORT TERM GOALS: Target date: 06/06/2024  Pt will be independent with initial HEP.  Baseline: Goal status: Met on 06/23/24  2.   Patient will report at least a 25% improvement since starting PT.  Baseline:  Goal status: Met on 07/07/24    LONG TERM GOALS: Target date: 07/10/2024  Patient will be independent with advanced HEP to allow for self progression after discharge. Baseline:  Goal status: Met  2.  Patient will be able to work a shift of work without increased pain.  Baseline:  Goal status: Met  3.  Patient will increase LE strength to [facility] to allow her to complete a forward T without hip compensations. Baseline:  Goal status: Met  4.  Patient will improve modified Oswestry to no greater than 20% to demonstrate improvements in functional mobility. Baseline: 32% Goal status: Met  5.  Patient will demonstrate improved body mechanics and posture and report at least a 50% decrease in shoulder pain. Baseline:  Goal status: Met    PLAN:  PT FREQUENCY: 1-2x/week  PT DURATION: 8 weeks  PLANNED INTERVENTIONS: 97164- PT Re-evaluation, 97750- Physical Performance Testing, 97110-Therapeutic exercises, 97530- Therapeutic activity, [id number]- Neuromuscular re-education, 97535- Self Care, [id number]- Manual therapy, [id number]- Gait training, [phone]- Canalith repositioning, [id number]- Aquatic Therapy, [phone]- Electrical stimulation (unattended), [phone]- Electrical stimulation (manual), [id number]- Vasopneumatic device,  [id number]- Ultrasound, [id number]- Traction (mechanical), [id number]- Ionotophoresis 4mg /ml Dexamethasone , 79439 (1-2 muscles), [id number] (3+ muscles)- Dry Needling, Patient/Family education, Balance training, Stair training, Taping, Joint mobilization, Joint manipulation, Spinal manipulation, Spinal mobilization, Vestibular training, Cryotherapy, and Moist heat.    PHYSICAL THERAPY DISCHARGE SUMMARY  Visits from Start of Care: 4  Current functional level related to goals / functional outcomes: See above   Remaining deficits: See above   Education / Equipment: Continue HEP and stretching   Patient agrees to discharge. Patient goals were met. Patient is being discharged due to meeting the stated rehab goals.    Jarrell Laming, PT, DPT 07/07/24, 3:43 PM  [facility] [address], [address] [place], [place] [address] Phone # [phone] Fax [phone]  "

## 2024-07-15 ENCOUNTER — Encounter (HOSPITAL_BASED_OUTPATIENT_CLINIC_OR_DEPARTMENT_OTHER): Payer: Self-pay | Admitting: Obstetrics & Gynecology

## 2024-07-15 ENCOUNTER — Ambulatory Visit (INDEPENDENT_AMBULATORY_CARE_PROVIDER_SITE_OTHER): Admitting: Obstetrics & Gynecology

## 2024-07-15 VITALS — BP 120/71 | HR 65 | Ht 67.0 in | Wt 239.2 lb

## 2024-07-15 DIAGNOSIS — Z86711 Personal history of pulmonary embolism: Secondary | ICD-10-CM | POA: Diagnosis not present

## 2024-07-15 DIAGNOSIS — Z1331 Encounter for screening for depression: Secondary | ICD-10-CM | POA: Diagnosis not present

## 2024-07-15 DIAGNOSIS — E890 Postprocedural hypothyroidism: Secondary | ICD-10-CM | POA: Diagnosis not present

## 2024-07-15 DIAGNOSIS — Z7901 Long term (current) use of anticoagulants: Secondary | ICD-10-CM | POA: Diagnosis not present

## 2024-07-15 DIAGNOSIS — Z01419 Encounter for gynecological examination (general) (routine) without abnormal findings: Secondary | ICD-10-CM

## 2024-07-15 DIAGNOSIS — Z85850 Personal history of malignant neoplasm of thyroid: Secondary | ICD-10-CM | POA: Diagnosis not present

## 2024-07-15 DIAGNOSIS — C73 Malignant neoplasm of thyroid gland: Secondary | ICD-10-CM

## 2024-07-15 NOTE — Progress Notes (Signed)
 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 G0P0000 Caucasian female being seen today for a routine annual exam.  H/o menorrhagia.  Hx of Mirena  IUD use.  IUD removed last year.  Cycles are regular and lasts about 5 days.  Heavier flow is for about two days.  Continues to work on weight loss.  If BMI <35, will be be able to decrease anticoagulation by 50%.  Bought property and planning on building a house.  With same partner.  Things are good.    LMP:  early January  Last pap 07/15/2023. Results were: NILM w/ HRHPV negative. H/O abnormal pap: yes Last mammogram: 06/09/2024. Results were: normal. Family h/o breast cancer: yes maternal aunt age 39. Last colonoscopy: 12/11/2023. Results were: abnormal 1 polyp. Family h/o colorectal cancer: no.  F/U 10 years.     07/15/2024    3:58 PM 04/22/2024   11:07 AM 03/31/2024    2:49 PM 07/15/2023    4:43 PM 07/27/2022    2:06 PM  Depression screen PHQ 2/9  Decreased Interest 0 0 0 0 0  Down, Depressed, Hopeless 0 0 0 0 0  PHQ - 2 Score 0 0 0 0 0  Altered sleeping 1 0     Tired, decreased energy 0 1     Change in appetite 0 0     Feeling bad or failure about yourself  0 0     Trouble concentrating 0 0     Moving slowly or fidgety/restless 0 0     Suicidal thoughts 0 0     PHQ-9 Score 1 1      Difficult doing work/chores  Not difficult at all        Data saved with a previous flowsheet row definition        07/15/2024    3:58 PM 07/11/2021    2:08 PM  GAD 7 : Generalized Anxiety Score  Nervous, Anxious, on Edge 0 0   Control/stop worrying 0 0   Worry too much - different things 0 0   Trouble relaxing 0 0   Restless 0 0   Easily annoyed or irritable 0 0   Afraid - awful might happen 0 0   Total GAD 7 Score 0 0  Anxiety Difficulty  Not difficult at all     Data saved with a  previous flowsheet row definition     Review of Systems:   Pertinent items are noted in HPI Denies any urinary or bowel changes.  Denies pelvic pain.  Pertinent History Reviewed:  Reviewed past medical,surgical, social and family history.  Reviewed problem list, medications and allergies. Physical Assessment:   Vitals:   07/15/24 1556  BP: 120/71  Pulse: 65  Weight: 239 lb 3.2 oz (108.5 kg)  Height: 5' 7 (1.702 m)  Body mass index is 37.46 kg/m.        Physical Examination:   General appearance - well appearing, and in no distress  Mental status - alert, oriented to person, place, and time  Psych:  She has a normal mood and affect  Skin - warm and dry, normal color, no suspicious lesions noted  Chest - effort normal, all lung fields clear to auscultation bilaterally  Heart - normal rate and regular rhythm  Neck:  midline trachea, no thyromegaly or  nodules  Breasts - breasts appear normal, no suspicious masses, no skin or nipple changes or  axillary nodes  Abdomen - soft, nontender, nondistended, no masses or organomegaly  Pelvic - VULVA: normal appearing vulva with no masses, tenderness or lesions   VAGINA: normal appearing vagina with normal color and discharge, no lesions   CERVIX: normal appearing cervix without discharge or lesions, no CMT  Thin prep pap is not indicated  UTERUS: uterus is felt to be normal size, shape, consistency and nontender   ADNEXA: No adnexal masses or tenderness noted.  Rectal - deferred  Extremities:  No swelling or varicosities noted  Chaperone present for exam  No results found for this or any previous visit (from the past 24 hours).  Assessment & Plan:  1. Well woman exam with routine gynecological exam (Primary) - Pap smear 07/15/2023 neg with neg HR HPV - Mammogram 06/09/2024 normal - Colonoscopy 12/11/2023.  F/u 10 years. - lab work done with Dr. Verdon - vaccines reviewed/updated  2. History of pulmonary embolism - on eliquis   5mg  bid  3. Hx of papillary thyroid  carcinoma (HCC)  4. Postsurgical hypothyroidism - on synthroid  75mcg daily   No orders of the defined types were placed in this encounter.   Meds: No orders of the defined types were placed in this encounter.   Follow-up: Return in about 1 year (around 07/15/2025).  Ronal GORMAN Pinal, MD 07/18/2024 6:27 AM "

## 2024-07-21 ENCOUNTER — Ambulatory Visit (HOSPITAL_BASED_OUTPATIENT_CLINIC_OR_DEPARTMENT_OTHER): Payer: BC Managed Care – PPO | Admitting: Obstetrics & Gynecology

## 2024-07-21 ENCOUNTER — Encounter (INDEPENDENT_AMBULATORY_CARE_PROVIDER_SITE_OTHER): Payer: Self-pay | Admitting: Family Medicine

## 2024-07-21 ENCOUNTER — Ambulatory Visit (INDEPENDENT_AMBULATORY_CARE_PROVIDER_SITE_OTHER): Admitting: Family Medicine

## 2024-07-21 VITALS — BP 101/66 | HR 78 | Temp 98.3°F | Ht 67.0 in | Wt 237.0 lb

## 2024-07-21 DIAGNOSIS — F3289 Other specified depressive episodes: Secondary | ICD-10-CM

## 2024-07-21 DIAGNOSIS — E669 Obesity, unspecified: Secondary | ICD-10-CM

## 2024-07-21 DIAGNOSIS — E88819 Insulin resistance, unspecified: Secondary | ICD-10-CM

## 2024-07-21 DIAGNOSIS — E559 Vitamin D deficiency, unspecified: Secondary | ICD-10-CM | POA: Diagnosis not present

## 2024-07-21 DIAGNOSIS — Z6837 Body mass index (BMI) 37.0-37.9, adult: Secondary | ICD-10-CM | POA: Diagnosis not present

## 2024-07-21 DIAGNOSIS — E7800 Pure hypercholesterolemia, unspecified: Secondary | ICD-10-CM | POA: Diagnosis not present

## 2024-07-21 MED ORDER — BUPROPION HCL ER (SR) 150 MG PO TB12: 150.0000 mg | ORAL_TABLET | Freq: Two times a day (BID) | ORAL | 0 refills | Status: AC

## 2024-07-21 MED ORDER — METFORMIN HCL 500 MG PO TABS: 500.0000 mg | ORAL_TABLET | Freq: Two times a day (BID) | ORAL | 0 refills | Status: AC

## 2024-07-21 MED ORDER — ROSUVASTATIN CALCIUM 5 MG PO TABS: 5.0000 mg | ORAL_TABLET | Freq: Every day | ORAL | 0 refills | Status: AC

## 2024-08-18 ENCOUNTER — Ambulatory Visit (INDEPENDENT_AMBULATORY_CARE_PROVIDER_SITE_OTHER): Admitting: Family Medicine

## 2024-09-29 ENCOUNTER — Ambulatory Visit (INDEPENDENT_AMBULATORY_CARE_PROVIDER_SITE_OTHER): Admitting: Family Medicine

## 2024-12-01 ENCOUNTER — Ambulatory Visit: Admitting: Internal Medicine

## 2025-04-13 ENCOUNTER — Inpatient Hospital Stay: Admitting: Oncology
